# Patient Record
Sex: Female | Born: 1945 | Race: White | Hispanic: No | Marital: Married | Smoking: Never smoker
Health system: Southern US, Community
[De-identification: ages and names within clinical notes are randomized; demographics above are authoritative.]

## PROBLEM LIST (undated history)

## (undated) DIAGNOSIS — N189 Chronic kidney disease, unspecified: Secondary | ICD-10-CM

## (undated) DIAGNOSIS — I509 Heart failure, unspecified: Secondary | ICD-10-CM

## (undated) DIAGNOSIS — H9190 Unspecified hearing loss, unspecified ear: Secondary | ICD-10-CM

## (undated) DIAGNOSIS — R32 Unspecified urinary incontinence: Secondary | ICD-10-CM

## (undated) DIAGNOSIS — I429 Cardiomyopathy, unspecified: Secondary | ICD-10-CM

## (undated) DIAGNOSIS — E119 Type 2 diabetes mellitus without complications: Secondary | ICD-10-CM

## (undated) DIAGNOSIS — I10 Essential (primary) hypertension: Secondary | ICD-10-CM

## (undated) DIAGNOSIS — E7800 Pure hypercholesterolemia, unspecified: Secondary | ICD-10-CM

## (undated) DIAGNOSIS — K219 Gastro-esophageal reflux disease without esophagitis: Secondary | ICD-10-CM

## (undated) DIAGNOSIS — E669 Obesity, unspecified: Secondary | ICD-10-CM

## (undated) DIAGNOSIS — Z972 Presence of dental prosthetic device (complete) (partial): Secondary | ICD-10-CM

## (undated) DIAGNOSIS — G4730 Sleep apnea, unspecified: Secondary | ICD-10-CM

## (undated) DIAGNOSIS — M1990 Unspecified osteoarthritis, unspecified site: Secondary | ICD-10-CM

## (undated) HISTORY — PX: TONSILLECTOMY: SUR1361

## (undated) HISTORY — PX: PACEMAKER INSERTION: SHX728

## (undated) HISTORY — PX: KIDNEY STONE SURGERY: SHX686

## (undated) HISTORY — PX: DENTAL SURGERY: SHX609

---

## 2004-06-02 ENCOUNTER — Encounter: Payer: Self-pay | Admitting: Rheumatology

## 2004-09-02 ENCOUNTER — Emergency Department: Payer: Self-pay | Admitting: Emergency Medicine

## 2005-02-17 ENCOUNTER — Emergency Department: Payer: Self-pay | Admitting: Emergency Medicine

## 2005-05-06 ENCOUNTER — Ambulatory Visit: Payer: Self-pay

## 2005-05-30 ENCOUNTER — Ambulatory Visit: Payer: Self-pay | Admitting: Cardiology

## 2005-06-04 ENCOUNTER — Ambulatory Visit: Payer: Self-pay | Admitting: Internal Medicine

## 2005-07-03 ENCOUNTER — Ambulatory Visit (HOSPITAL_COMMUNITY): Admission: RE | Admit: 2005-07-03 | Discharge: 2005-07-04 | Payer: Self-pay | Admitting: Cardiovascular Disease

## 2005-07-03 ENCOUNTER — Ambulatory Visit: Payer: Self-pay | Admitting: Internal Medicine

## 2005-07-16 ENCOUNTER — Ambulatory Visit: Payer: Self-pay

## 2006-03-01 ENCOUNTER — Ambulatory Visit: Payer: Self-pay

## 2006-06-11 ENCOUNTER — Ambulatory Visit: Payer: Self-pay | Admitting: Internal Medicine

## 2006-11-08 ENCOUNTER — Ambulatory Visit: Payer: Self-pay | Admitting: Family Medicine

## 2006-12-24 ENCOUNTER — Ambulatory Visit: Payer: Self-pay | Admitting: Family Medicine

## 2007-02-24 ENCOUNTER — Inpatient Hospital Stay: Payer: Self-pay | Admitting: Otolaryngology

## 2007-02-24 ENCOUNTER — Other Ambulatory Visit: Payer: Self-pay

## 2007-07-25 ENCOUNTER — Ambulatory Visit: Payer: Self-pay | Admitting: Family Medicine

## 2008-05-12 DIAGNOSIS — I5023 Acute on chronic systolic (congestive) heart failure: Secondary | ICD-10-CM | POA: Insufficient documentation

## 2008-05-12 DIAGNOSIS — Z95810 Presence of automatic (implantable) cardiac defibrillator: Secondary | ICD-10-CM | POA: Insufficient documentation

## 2008-05-12 DIAGNOSIS — I428 Other cardiomyopathies: Secondary | ICD-10-CM | POA: Insufficient documentation

## 2008-05-12 DIAGNOSIS — I447 Left bundle-branch block, unspecified: Secondary | ICD-10-CM | POA: Insufficient documentation

## 2009-05-13 ENCOUNTER — Ambulatory Visit: Payer: Self-pay | Admitting: Cardiology

## 2009-05-20 ENCOUNTER — Ambulatory Visit: Payer: Self-pay | Admitting: Cardiology

## 2011-09-04 ENCOUNTER — Ambulatory Visit: Payer: Self-pay | Admitting: Family Medicine

## 2012-07-02 ENCOUNTER — Ambulatory Visit: Payer: Self-pay | Admitting: Unknown Physician Specialty

## 2014-04-07 DIAGNOSIS — I429 Cardiomyopathy, unspecified: Secondary | ICD-10-CM | POA: Insufficient documentation

## 2014-04-07 DIAGNOSIS — Z95810 Presence of automatic (implantable) cardiac defibrillator: Secondary | ICD-10-CM | POA: Insufficient documentation

## 2014-04-07 DIAGNOSIS — G4730 Sleep apnea, unspecified: Secondary | ICD-10-CM | POA: Insufficient documentation

## 2014-04-07 DIAGNOSIS — K219 Gastro-esophageal reflux disease without esophagitis: Secondary | ICD-10-CM | POA: Insufficient documentation

## 2014-04-08 DIAGNOSIS — I429 Cardiomyopathy, unspecified: Secondary | ICD-10-CM | POA: Diagnosis not present

## 2014-04-08 DIAGNOSIS — I509 Heart failure, unspecified: Secondary | ICD-10-CM | POA: Diagnosis not present

## 2014-04-08 DIAGNOSIS — I5023 Acute on chronic systolic (congestive) heart failure: Secondary | ICD-10-CM | POA: Diagnosis not present

## 2014-04-08 DIAGNOSIS — I447 Left bundle-branch block, unspecified: Secondary | ICD-10-CM | POA: Diagnosis not present

## 2014-04-08 DIAGNOSIS — Z95810 Presence of automatic (implantable) cardiac defibrillator: Secondary | ICD-10-CM | POA: Diagnosis not present

## 2014-07-15 DIAGNOSIS — I10 Essential (primary) hypertension: Secondary | ICD-10-CM | POA: Diagnosis not present

## 2014-07-15 DIAGNOSIS — E1165 Type 2 diabetes mellitus with hyperglycemia: Secondary | ICD-10-CM | POA: Diagnosis not present

## 2014-11-10 DIAGNOSIS — I447 Left bundle-branch block, unspecified: Secondary | ICD-10-CM | POA: Diagnosis not present

## 2014-11-10 DIAGNOSIS — I5023 Acute on chronic systolic (congestive) heart failure: Secondary | ICD-10-CM | POA: Diagnosis not present

## 2014-11-10 DIAGNOSIS — I429 Cardiomyopathy, unspecified: Secondary | ICD-10-CM | POA: Diagnosis not present

## 2014-11-10 DIAGNOSIS — Z95810 Presence of automatic (implantable) cardiac defibrillator: Secondary | ICD-10-CM | POA: Diagnosis not present

## 2014-11-23 DIAGNOSIS — I5023 Acute on chronic systolic (congestive) heart failure: Secondary | ICD-10-CM | POA: Diagnosis not present

## 2014-12-30 DIAGNOSIS — I5023 Acute on chronic systolic (congestive) heart failure: Secondary | ICD-10-CM | POA: Diagnosis not present

## 2015-01-25 DIAGNOSIS — I5023 Acute on chronic systolic (congestive) heart failure: Secondary | ICD-10-CM | POA: Diagnosis not present

## 2015-02-09 DIAGNOSIS — Z95810 Presence of automatic (implantable) cardiac defibrillator: Secondary | ICD-10-CM | POA: Diagnosis not present

## 2015-02-09 DIAGNOSIS — I447 Left bundle-branch block, unspecified: Secondary | ICD-10-CM | POA: Diagnosis not present

## 2015-02-09 DIAGNOSIS — I5023 Acute on chronic systolic (congestive) heart failure: Secondary | ICD-10-CM | POA: Diagnosis not present

## 2015-02-09 DIAGNOSIS — I429 Cardiomyopathy, unspecified: Secondary | ICD-10-CM | POA: Diagnosis not present

## 2015-02-11 ENCOUNTER — Encounter
Admission: RE | Admit: 2015-02-11 | Discharge: 2015-02-11 | Disposition: A | Discharge status: Home / Self Care | Payer: Medicare Other | Source: Ambulatory Visit | Attending: Cardiology | Admitting: Cardiology

## 2015-02-11 ENCOUNTER — Ambulatory Visit
Admission: RE | Admit: 2015-02-11 | Discharge: 2015-02-11 | Disposition: A | Discharge status: Home / Self Care | Payer: Medicare Other | Source: Ambulatory Visit | Attending: Cardiology | Admitting: Cardiology

## 2015-02-11 ENCOUNTER — Other Ambulatory Visit: Payer: Self-pay

## 2015-02-11 DIAGNOSIS — Z950 Presence of cardiac pacemaker: Secondary | ICD-10-CM | POA: Diagnosis not present

## 2015-02-11 DIAGNOSIS — Z01810 Encounter for preprocedural cardiovascular examination: Secondary | ICD-10-CM | POA: Insufficient documentation

## 2015-02-11 DIAGNOSIS — Z01811 Encounter for preprocedural respiratory examination: Secondary | ICD-10-CM | POA: Diagnosis not present

## 2015-02-11 DIAGNOSIS — Z01818 Encounter for other preprocedural examination: Secondary | ICD-10-CM | POA: Diagnosis not present

## 2015-02-11 HISTORY — DX: Gastro-esophageal reflux disease without esophagitis: K21.9

## 2015-02-11 HISTORY — DX: Essential (primary) hypertension: I10

## 2015-02-11 HISTORY — DX: Obesity, unspecified: E66.9

## 2015-02-11 HISTORY — DX: Sleep apnea, unspecified: G47.30

## 2015-02-11 HISTORY — DX: Unspecified urinary incontinence: R32

## 2015-02-11 HISTORY — DX: Unspecified osteoarthritis, unspecified site: M19.90

## 2015-02-11 HISTORY — DX: Heart failure, unspecified: I50.9

## 2015-02-11 HISTORY — DX: Type 2 diabetes mellitus without complications: E11.9

## 2015-02-11 HISTORY — DX: Cardiomyopathy, unspecified: I42.9

## 2015-02-11 HISTORY — DX: Chronic kidney disease, unspecified: N18.9

## 2015-02-11 LAB — BASIC METABOLIC PANEL
ANION GAP: 8 (ref 5–15)
BUN: 11 mg/dL (ref 6–20)
CALCIUM: 9.5 mg/dL (ref 8.9–10.3)
CO2: 29 mmol/L (ref 22–32)
Chloride: 100 mmol/L — ABNORMAL LOW (ref 101–111)
Creatinine, Ser: 0.69 mg/dL (ref 0.44–1.00)
Glucose, Bld: 325 mg/dL — ABNORMAL HIGH (ref 65–99)
Potassium: 3.6 mmol/L (ref 3.5–5.1)
Sodium: 137 mmol/L (ref 135–145)

## 2015-02-11 LAB — DIFFERENTIAL
BASOS ABS: 0 10*3/uL (ref 0–0.1)
BASOS PCT: 0 %
EOS ABS: 0.1 10*3/uL (ref 0–0.7)
Eosinophils Relative: 2 %
Lymphocytes Relative: 28 %
Lymphs Abs: 1.9 10*3/uL (ref 1.0–3.6)
Monocytes Absolute: 0.6 10*3/uL (ref 0.2–0.9)
Monocytes Relative: 8 %
NEUTROS ABS: 4.3 10*3/uL (ref 1.4–6.5)
NEUTROS PCT: 62 %

## 2015-02-11 LAB — CBC
HCT: 37.9 % (ref 35.0–47.0)
HEMOGLOBIN: 12.7 g/dL (ref 12.0–16.0)
MCH: 29.9 pg (ref 26.0–34.0)
MCHC: 33.4 g/dL (ref 32.0–36.0)
MCV: 89.4 fL (ref 80.0–100.0)
Platelets: 169 10*3/uL (ref 150–440)
RBC: 4.25 MIL/uL (ref 3.80–5.20)
RDW: 13.4 % (ref 11.5–14.5)
WBC: 6.9 10*3/uL (ref 3.6–11.0)

## 2015-02-11 LAB — PROTIME-INR
INR: 1
PROTHROMBIN TIME: 13.4 s (ref 11.4–15.0)

## 2015-02-11 LAB — SURGICAL PCR SCREEN
MRSA, PCR: NEGATIVE
Staphylococcus aureus: NEGATIVE

## 2015-02-11 LAB — APTT: APTT: 25 s (ref 24–36)

## 2015-02-11 NOTE — Patient Instructions (Signed)
  Your procedure is scheduled on: 02/15/15 Tues  Report to Day Surgery. To find out your arrival time please call [phone] between 1PM - 3PM on 02/14/15 Mon.  Remember: Instructions that are not followed completely may result in serious medical risk, up to and including death, or upon the discretion of your surgeon and anesthesiologist your surgery may need to be rescheduled.    __x__ 1. Do not eat food or drink liquids after midnight. No gum chewing or hard candies.     ____ 2. No Alcohol for 24 hours before or after surgery.   ____ 3. Bring all medications with you on the day of surgery if instructed.    _x___ 4. Notify your doctor if there is any change in your medical condition     (cold, fever, infections).     Do not wear jewelry, make-up, hairpins, clips or nail polish.  Do not wear lotions, powders, or perfumes. You may wear deodorant.  Do not shave 48 hours prior to surgery. Men may shave face and neck.  Do not bring valuables to the hospital.    [facility] belongings or valuables.               Contacts, dentures or bridgework may not be worn into surgery.  Leave your suitcase in the car. After surgery it may be brought to your room.  For patients admitted to the hospital, discharge time is determined by your                treatment team.   Patients discharged the day of surgery will not be allowed to drive home.   Please read over the following fact sheets that you were given:   MRSA booklet   __x Take these medicines the morning of surgery with A SIP OF WATER:    1.carvedilol (COREG) 25 MG tablet  2. lisinopril (PRINIVIL,ZESTRIL) 10 MG tablet  3. omeprazole (PRILOSEC) 20 MG capsule  4.  5.  6.  ____ Fleet Enema (as directed)   _x___ Use CHG Soap as directed  ____ Use inhalers on the day of surgery  ____ Stop metformin 2 days prior to surgery    ____ Take 1/2 of usual insulin dose the night before surgery and none on the  morning of surgery.   _x___ Stop Coumadin/Plavix/aspirin on Stopped Aspirin 1 week ago  ____ Stop Anti-inflammatories on    ____ Stop supplements until after surgery.    ____ Bring C-Pap to the hospital.

## 2015-02-15 ENCOUNTER — Encounter: Payer: Self-pay | Admitting: *Deleted

## 2015-02-15 ENCOUNTER — Encounter
Admission: RE | Disposition: A | Discharge status: Home / Self Care | Payer: Self-pay | Source: Ambulatory Visit | Attending: Cardiology

## 2015-02-15 ENCOUNTER — Ambulatory Visit: Payer: Medicare Other | Admitting: Certified Registered Nurse Anesthetist

## 2015-02-15 ENCOUNTER — Ambulatory Visit
Admission: RE | Admit: 2015-02-15 | Discharge: 2015-02-15 | Disposition: A | Discharge status: Home / Self Care | Payer: Medicare Other | Source: Ambulatory Visit | Attending: Cardiology | Admitting: Cardiology

## 2015-02-15 DIAGNOSIS — Z809 Family history of malignant neoplasm, unspecified: Secondary | ICD-10-CM | POA: Diagnosis not present

## 2015-02-15 DIAGNOSIS — Z79899 Other long term (current) drug therapy: Secondary | ICD-10-CM | POA: Insufficient documentation

## 2015-02-15 DIAGNOSIS — I129 Hypertensive chronic kidney disease with stage 1 through stage 4 chronic kidney disease, or unspecified chronic kidney disease: Secondary | ICD-10-CM | POA: Diagnosis not present

## 2015-02-15 DIAGNOSIS — Z95810 Presence of automatic (implantable) cardiac defibrillator: Secondary | ICD-10-CM | POA: Diagnosis not present

## 2015-02-15 DIAGNOSIS — M1990 Unspecified osteoarthritis, unspecified site: Secondary | ICD-10-CM | POA: Insufficient documentation

## 2015-02-15 DIAGNOSIS — I509 Heart failure, unspecified: Secondary | ICD-10-CM | POA: Diagnosis not present

## 2015-02-15 DIAGNOSIS — N189 Chronic kidney disease, unspecified: Secondary | ICD-10-CM | POA: Diagnosis not present

## 2015-02-15 DIAGNOSIS — Z7982 Long term (current) use of aspirin: Secondary | ICD-10-CM | POA: Insufficient documentation

## 2015-02-15 DIAGNOSIS — Z6838 Body mass index (BMI) 38.0-38.9, adult: Secondary | ICD-10-CM | POA: Diagnosis not present

## 2015-02-15 DIAGNOSIS — Z823 Family history of stroke: Secondary | ICD-10-CM | POA: Diagnosis not present

## 2015-02-15 DIAGNOSIS — I420 Dilated cardiomyopathy: Secondary | ICD-10-CM | POA: Insufficient documentation

## 2015-02-15 DIAGNOSIS — I447 Left bundle-branch block, unspecified: Secondary | ICD-10-CM | POA: Insufficient documentation

## 2015-02-15 DIAGNOSIS — K219 Gastro-esophageal reflux disease without esophagitis: Secondary | ICD-10-CM | POA: Diagnosis not present

## 2015-02-15 DIAGNOSIS — G4730 Sleep apnea, unspecified: Secondary | ICD-10-CM | POA: Diagnosis not present

## 2015-02-15 DIAGNOSIS — Z45018 Encounter for adjustment and management of other part of cardiac pacemaker: Secondary | ICD-10-CM | POA: Diagnosis not present

## 2015-02-15 DIAGNOSIS — I429 Cardiomyopathy, unspecified: Secondary | ICD-10-CM | POA: Diagnosis not present

## 2015-02-15 DIAGNOSIS — I5023 Acute on chronic systolic (congestive) heart failure: Secondary | ICD-10-CM | POA: Insufficient documentation

## 2015-02-15 DIAGNOSIS — I5022 Chronic systolic (congestive) heart failure: Secondary | ICD-10-CM | POA: Diagnosis not present

## 2015-02-15 DIAGNOSIS — R0602 Shortness of breath: Secondary | ICD-10-CM | POA: Insufficient documentation

## 2015-02-15 HISTORY — PX: IMPLANTABLE CARDIOVERTER DEFIBRILLATOR (ICD) GENERATOR CHANGE: SHX5469

## 2015-02-15 LAB — GLUCOSE, CAPILLARY: GLUCOSE-CAPILLARY: 207 mg/dL — AB (ref 65–99)

## 2015-02-15 SURGERY — ICD GENERATOR CHANGE
Anesthesia: General | Site: Chest | Wound class: Clean

## 2015-02-15 MED ORDER — CEPHALEXIN 250 MG PO CAPS: 250.0000 mg | ORAL_CAPSULE | Freq: Four times a day (QID) | ORAL | Status: DC

## 2015-02-15 MED ORDER — LIDOCAINE 1 % OPTIME INJ - NO CHARGE
INTRAMUSCULAR | Status: DC | PRN
  Administered 2015-02-15: 28 mL

## 2015-02-15 MED ORDER — GENTAMICIN SULFATE 40 MG/ML IJ SOLN
INTRAMUSCULAR | Status: DC | PRN
  Administered 2015-02-15: 80 mg

## 2015-02-15 MED ORDER — FENTANYL CITRATE (PF) 100 MCG/2ML IJ SOLN: 25.0000 ug | INTRAMUSCULAR | Status: DC | PRN

## 2015-02-15 MED ORDER — SODIUM CHLORIDE 0.9 % IV SOLN
INTRAVENOUS | Status: DC
  Administered 2015-02-15: 11:00:00 via INTRAVENOUS

## 2015-02-15 MED ORDER — PROPOFOL 500 MG/50ML IV EMUL
INTRAVENOUS | Status: DC | PRN
  Administered 2015-02-15: 25 ug/kg/min via INTRAVENOUS

## 2015-02-15 MED ORDER — ONDANSETRON HCL 4 MG/2ML IJ SOLN: 4.0000 mg | Freq: Four times a day (QID) | INTRAMUSCULAR | Status: DC | PRN

## 2015-02-15 MED ORDER — CEFAZOLIN SODIUM 1-5 GM-% IV SOLN
INTRAVENOUS | Status: AC
  Administered 2015-02-15: 1 g via INTRAVENOUS
  Filled 2015-02-15: qty 50

## 2015-02-15 MED ORDER — SODIUM CHLORIDE 0.9 % IR SOLN: Freq: Once | Status: DC

## 2015-02-15 MED ORDER — ACETAMINOPHEN 325 MG PO TABS: 325.0000 mg | ORAL_TABLET | ORAL | Status: DC | PRN

## 2015-02-15 MED ORDER — MIDAZOLAM HCL 2 MG/2ML IJ SOLN
INTRAMUSCULAR | Status: DC | PRN
  Administered 2015-02-15 (×2): 1 mg via INTRAVENOUS

## 2015-02-15 MED ORDER — CEFAZOLIN SODIUM 1-5 GM-% IV SOLN
1.0000 g | Freq: Once | INTRAVENOUS | Status: AC
  Administered 2015-02-15: 1 g via INTRAVENOUS

## 2015-02-15 MED ORDER — ONDANSETRON HCL 4 MG/2ML IJ SOLN: 4.0000 mg | Freq: Once | INTRAMUSCULAR | Status: DC | PRN

## 2015-02-15 MED ORDER — PROPOFOL 10 MG/ML IV BOLUS
INTRAVENOUS | Status: DC | PRN
  Administered 2015-02-15: 10 mg via INTRAVENOUS

## 2015-02-15 MED ORDER — FENTANYL CITRATE (PF) 100 MCG/2ML IJ SOLN
INTRAMUSCULAR | Status: DC | PRN
  Administered 2015-02-15: 50 ug via INTRAVENOUS

## 2015-02-15 MED ORDER — GENTAMICIN SULFATE 40 MG/ML IJ SOLN
INTRAMUSCULAR | Status: AC
  Filled 2015-02-15: qty 2

## 2015-02-15 SURGICAL SUPPLY — 46 items
BAG DECANTER FOR FLEXI CONT (MISCELLANEOUS) ×3 IMPLANT
BLADE SURG SZ10 CARB STEEL (BLADE) ×3 IMPLANT
CANISTER SUCT 1200ML W/VALVE (MISCELLANEOUS) ×3 IMPLANT
CHLORAPREP W/TINT 26ML (MISCELLANEOUS) ×3 IMPLANT
CLOSURE WOUND 1/2 X4 (GAUZE/BANDAGES/DRESSINGS) ×1
COVER LIGHT HANDLE STERIS (MISCELLANEOUS) ×6 IMPLANT
DEVICE DISSECT PLASMABLAD 3.0S (MISCELLANEOUS) ×1 IMPLANT
DRAPE C-ARM XRAY 36X54 (DRAPES) ×3 IMPLANT
DRAPE INCISE IOBAN 66X45 STRL (DRAPES) ×3 IMPLANT
DRAPE PED LAPAROTOMY (DRAPES) ×3 IMPLANT
DRSG TEGADERM 4X4.75 (GAUZE/BANDAGES/DRESSINGS) ×3 IMPLANT
DRSG TEGADERM 6X8 (GAUZE/BANDAGES/DRESSINGS) ×3 IMPLANT
GLOVE BIO SURGEON STRL SZ8 (GLOVE) ×3 IMPLANT
GOWN STRL REUS W/ TWL LRG LVL3 (GOWN DISPOSABLE) ×1 IMPLANT
GOWN STRL REUS W/ TWL XL LVL3 (GOWN DISPOSABLE) ×1 IMPLANT
GOWN STRL REUS W/TWL LRG LVL3 (GOWN DISPOSABLE) ×3
GOWN STRL REUS W/TWL XL LVL3 (GOWN DISPOSABLE) ×3
GRADUATE 1200CC STRL 31836 (MISCELLANEOUS) ×3 IMPLANT
ICD VIVA XT CRT-D DTBA1D1 (ICD Generator) ×2 IMPLANT
IV NS 1000ML (IV SOLUTION) ×3
IV NS 1000ML BAXH (IV SOLUTION) ×1 IMPLANT
KIT RM TURNOVER STRD PROC AR (KITS) ×3 IMPLANT
NDL FILTER BLUNT 18X1 1/2 (NEEDLE) ×1 IMPLANT
NDL HYPO 25X1 1.5 SAFETY (NEEDLE) ×1 IMPLANT
NDL SPNL 22GX3.5 QUINCKE BK (NEEDLE) ×1 IMPLANT
NEEDLE FILTER BLUNT 18X 1/2SAF (NEEDLE) ×2
NEEDLE FILTER BLUNT 18X1 1/2 (NEEDLE) ×1 IMPLANT
NEEDLE HYPO 25X1 1.5 SAFETY (NEEDLE) ×3 IMPLANT
NEEDLE SPNL 22GX3.5 QUINCKE BK (NEEDLE) ×3 IMPLANT
NS IRRIG 500ML POUR BTL (IV SOLUTION) ×3 IMPLANT
PACK BASIN MINOR ARMC (MISCELLANEOUS) ×3 IMPLANT
PACK PACE INSERTION (MISCELLANEOUS) ×3 IMPLANT
PAD GROUND ADULT SPLIT (MISCELLANEOUS) ×3 IMPLANT
PAD STATPAD (MISCELLANEOUS) ×3 IMPLANT
PLASMABLADE 3.0S (MISCELLANEOUS) ×3
STRAP SAFETY BODY (MISCELLANEOUS) ×3 IMPLANT
STRIP CLOSURE SKIN 1/2X4 (GAUZE/BANDAGES/DRESSINGS) ×2 IMPLANT
SUT SILK 2 0 SH (SUTURE) ×3 IMPLANT
SUT VIC AB 2-0 CT1 27 (SUTURE) ×3
SUT VIC AB 2-0 CT1 TAPERPNT 27 (SUTURE) ×1 IMPLANT
SUT VIC AB 2-0 CT2 27 (SUTURE) ×3 IMPLANT
SUT VIC AB 3-0 PS2 18 (SUTURE) ×3 IMPLANT
SUT VIC AB 4-0 PS2 18 (SUTURE) ×3 IMPLANT
SYR BULB IRRIG 60ML STRL (SYRINGE) ×3 IMPLANT
SYR CONTROL 10ML (SYRINGE) ×3 IMPLANT
SYRINGE 10CC LL (SYRINGE) ×3 IMPLANT

## 2015-02-15 NOTE — Anesthesia Procedure Notes (Signed)
Performed by: ,  Pre-anesthesia Checklist: Patient identified, Emergency Drugs available, Suction available, Patient being monitored and Timeout performed Patient Re-evaluated:Patient Re-evaluated prior to inductionOxygen Delivery Method: Simple face mask Intubation Type: IV induction       

## 2015-02-15 NOTE — Anesthesia Postprocedure Evaluation (Signed)
  Anesthesia Post-op Note  Patient: Emma Guzman  Procedure(s) Performed: Procedure(s): ICD GENERATOR CHANGE (N/A)  Anesthesia type:General  Patient location: PACU  Post pain: Pain level controlled  Post assessment: Post-op Vital signs reviewed, Patient's Cardiovascular Status Stable, Respiratory Function Stable, Patent Airway and No signs of Nausea or vomiting  Post vital signs: Reviewed and stable  Last Vitals:  Filed Vitals:   02/15/15 1340  BP:   Pulse:   Temp: 36.1 C  Resp:     Level of consciousness: awake, alert  and patient cooperative  Complications: No apparent anesthesia complications

## 2015-02-15 NOTE — Interval H&P Note (Signed)
History and Physical Interval Note:  02/15/2015 7:42 AM  Emma Guzman  has presented today for surgery, with the diagnosis of CHF  The various methods of treatment have been discussed with the patient and family. After consideration of risks, benefits and other options for treatment, the patient has consented to  Procedure(s): ICD GENERATOR CHANGE (N/A) as a surgical intervention .  The patient's history has been reviewed, patient examined, no change in status, stable for surgery.  I have reviewed the patient's chart and labs.  Questions were answered to the patient's satisfaction.     ,

## 2015-02-15 NOTE — H&P (Signed)
Printout Information Document Contents Office Visit Document Received Date 02/11/2015 Document Source Organization [facility] Patient Demographics  Reason for Visit  Encounter Details  Last Filed Vital Signs (In this Visit)  Instructions  Progress Notes  Plan of Care  Visit Diagnoses  Document Information Encounter Summary - Emma Guzman (69 y.o. Female)As of Nov. 11, 2016 Patient Demographics Patient Address Communication Language Race / Ethnicity  [address] [place], [place] [address]  [phone] [facility]) [phone] [facility]) [phone] (Work)  English (Preferred) Caucasian/White / Not Hispanic/Latino  Reason for Visit Reason Comments  Follow-up PM alarm going off.  Encounter Details Date Type Department Care Team Description  02/09/2015 Office Visit [facility]  [address]  [place], [place] [address]  [phone]  Marcina Millard, MD  [address]  [facility]  [place], [place] [address]  [phone]  [phone] (Fax)  Acute on chronic systolic heart failure (Primary Dx);Cardiomyopathy;Left bundle-branch block;Automatic implantable cardioverter-defibrillator in situ;Biventricular ICD (implantable cardioverter-defibrillator) in place  Last Filed Vital Signs (In this Visit) Vital Sign Reading Time Taken  Blood Pressure 138/74 02/09/2015 1:30 PM EST  Pulse 60 02/09/2015 1:30 PM EST  Temperature - -  Respiratory Rate - -  Oxygen Saturation - -  Weight 95.5 kg (210 lb 9.6 oz) 02/09/2015 1:30 PM EST  Height 157.5 cm (5\' 2" ) 02/09/2015 1:30 PM EST  Body Mass Index 38.52 02/09/2015 1:30 PM EST  Instructions Patient Instructions - Marcina Millard, MD - 02/09/2015 1:30 PM EST DASH Eating Plan DASH stands for "Dietary Approaches to Stop Hypertension." The DASH eating plan is a healthy eating plan that has been shown to reduce high blood pressure (hypertension). Additional health  benefits may include reducing the risk of type 2 diabetes mellitus, heart disease, and stroke. The DASH eating plan may also help with weight loss. WHAT DO I NEED TO KNOW ABOUT THE DASH EATING PLAN? For the DASH eating plan, you will follow these general guidelines:  Choose foods with a percent daily value for sodium of less than 5% (as listed on the food label).  Use salt-free seasonings or herbs instead of table salt or sea salt.  Check with your health care provider or pharmacist before using salt substitutes.  Eat lower-sodium products, often labeled as "lower sodium" or "no salt added."  Eat fresh foods.  Eat more vegetables, fruits, and low-fat dairy products.  Choose whole grains. Look for the word "whole" as the first word in the ingredient list.  Choose fish and skinless chicken or [place] more often than red meat. Limit fish, poultry, and meat to 6 oz (170 g) each day.  Limit sweets, desserts, sugars, and sugary drinks.  Choose heart-healthy fats.  Limit cheese to 1 oz (28 g) per day.  Eat more home-cooked food and less restaurant, buffet, and fast food.  Limit fried foods.  Cook foods using methods other than frying.  Limit canned vegetables. If you do use them, rinse them well to decrease the sodium.  When eating at a restaurant, ask that your food be prepared with less salt, or no salt if possible. WHAT FOODS CAN I EAT? Seek help from a dietitian for individual calorie needs. Grains Whole grain or whole wheat bread. Brown rice. Whole grain or whole wheat pasta. Quinoa, bulgur, and whole grain cereals. Low-sodium cereals. Corn or whole wheat flour tortillas. Whole grain cornbread. Whole grain crackers. Low-sodium crackers. Vegetables Fresh or frozen vegetables (raw, steamed, roasted, or grilled). Low-sodium or reduced-sodium tomato  and vegetable juices. Low-sodium or reduced-sodium tomato sauce and paste. Low-sodium or reduced-sodium canned vegetables.  Fruits All  fresh, canned (in natural juice), or frozen fruits. Meat and Other Protein Products Ground beef (85% or leaner), grass-fed beef, or beef trimmed of fat. Skinless chicken or [place]. Ground chicken or [place]. Pork trimmed of fat. All fish and seafood. Eggs. Dried beans, peas, or lentils. Unsalted nuts and seeds. Unsalted canned beans. Dairy Low-fat dairy products, such as skim or 1% milk, 2% or reduced-fat cheeses, low-fat ricotta or cottage cheese, or plain low-fat yogurt. Low-sodium or reduced-sodium cheeses. Fats and Oils Tub margarines without trans fats. Light or reduced-fat mayonnaise and salad dressings (reduced sodium). Avocado. Safflower, olive, or canola oils. Natural peanut or almond butter. Other Unsalted popcorn and pretzels. The items listed above may not be a complete list of recommended foods or beverages. Contact your dietitian for more options. WHAT FOODS ARE NOT RECOMMENDED? Grains White bread. White pasta. White rice. Refined cornbread. Bagels and croissants. Crackers that contain trans fat. Vegetables Creamed or fried vegetables. Vegetables in a cheese sauce. Regular canned vegetables. Regular canned tomato sauce and paste. Regular tomato and vegetable juices. Fruits Dried fruits. Canned fruit in light or heavy syrup. Fruit juice. Meat and Other Protein Products Fatty cuts of meat. Ribs, chicken wings, bacon, sausage, bologna, salami, chitterlings, fatback, hot dogs, bratwurst, and packaged luncheon meats. Salted nuts and seeds. Canned beans with salt. Dairy Whole or 2% milk, cream, half-and-half, and cream cheese. Whole-fat or sweetened yogurt. Full-fat cheeses or blue cheese. Nondairy creamers and whipped toppings. Processed cheese, cheese spreads, or cheese curds. Condiments Onion and garlic salt, seasoned salt, table salt, and sea salt. Canned and packaged gravies. Worcestershire sauce. Tartar sauce. Barbecue sauce. Teriyaki sauce. Soy sauce, including reduced sodium.  Steak sauce. Fish sauce. Oyster sauce. Cocktail sauce. Horseradish. Ketchup and mustard. Meat flavorings and tenderizers. Bouillon cubes. Hot sauce. Tabasco sauce. Marinades. Taco seasonings. Relishes. Fats and Oils Butter, stick margarine, lard, shortening, ghee, and bacon fat. Coconut, palm kernel, or palm oils. Regular salad dressings. Other Pickles and olives. Salted popcorn and pretzels. The items listed above may not be a complete list of foods and beverages to avoid. Contact your dietitian for more information. WHERE CAN I FIND MORE INFORMATION? National Heart, Lung, and Blood Institute: CablePromo.it  This information is not intended to replace advice given to you by your health care provider. Make sure you discuss any questions you have with your health care provider.  Document Released: 03/08/2011 Document Revised: 04/09/2014 Document Reviewed: 01/21/2013 Elsevier Interactive Patient Education 2016 Elsevier Inc.   Progress Notes Marcina Millard, MD - 02/09/2015 1:30 PM EST Formatting of this note may be different from the original. Established Patient Visit   Chief Complaint: Chief Complaint  Patient presents with  . Follow-up  PM alarm going off.  Date of Service: 02/09/2015 Date of Birth: 1945-09-18 PCP: Farrel Demark, MD  History of Present Illness: Ms. Levitan is a 69 y.o.female patient who returns for  1. Nonischemic dilated cardiomyopathy 2. Chronic systolic congestive heart failure 3. 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 CPAP 5. Morbid obesity  Patient denies chest pain. She does have chronic exertional dyspnea. She does report her shortness of breath improves during the cool weather. She denies palpitations or heart racing. She denies peripheral edema. Echocardiogram 10/30/2013 revealed normal left ventricular function with LV ejection fraction of 45% with moderate aortic insufficiency. ICD interrogation on 01/25/2015  revealed that the Bi V ICD is  a elective replacement indication.  Past Medical and Surgical History  Past Medical History Past Medical History  Diagnosis Date  . GERD (gastroesophageal reflux disease)  . Obesity  . Sleep apnea   Past Surgical History She has a past surgical history that includes Insert / replace / remove pacemaker.   Medications and Allergies  Current Medications  Current Outpatient Prescriptions  Medication Sig Dispense Refill  . acetaminophen (TYLENOL) 500 MG tablet Take 500 mg by mouth 2 (two) times daily.  Marland Kitchen aspirin 81 MG EC tablet Take 81 mg by mouth once daily.  . carvedilol (COREG) 25 MG tablet Take 1 tablet (25 mg total) by mouth 2 (two) times daily with meals. 180 tablet 4  . cyclobenzaprine (FLEXERIL) 5 MG tablet Take 5 mg by mouth 2 (two) times daily as needed for Muscle spasms.  Marland Kitchen ezetimibe (ZETIA) 10 mg tablet Take 1 tablet (10 mg total) by mouth once daily. 90 tablet 4  . lisinopril (PRINIVIL,ZESTRIL) 10 MG tablet Take 1 tablet (10 mg total) by mouth once daily. 90 tablet 4  . MV,IRON,MIN/FOLIC ACID/BIOTIN (HAIR, SKIN & NAILS- ARGAN OIL ORAL) Take by mouth.  Marland Kitchen omeprazole (PRILOSEC) 20 MG DR capsule Take 1 capsule (20 mg total) by mouth once daily. 90 capsule 4   No current facility-administered medications for this visit.   Allergies: Review of patient's allergies indicates no known allergies.  Social and Family History  Social History reports that she has never smoked. She does not have any smokeless tobacco history on file. She reports that she does not drink alcohol or use illicit drugs.  Family History Family History  Problem Relation Age of Onset  . Cancer Mother  . Stroke Father   Review of Systems   Review of Systems: The patient denies chest pain, reports chronic exertional shortness of breath, without orthopnea, paroxysmal nocturnal dyspnea, pedal edema, palpitations, heart racing, presyncope, syncope. Review of 12 Systems is negative  except as described above.  Physical Examination   Vitals: Visit Vitals  . BP 138/74 (BP Location: Left upper arm, Patient Position: Sitting, BP Cuff Size: Adult)  . Pulse 60  . Ht 157.5 cm (5\' 2" )  . Wt 95.5 kg (210 lb 9.6 oz)  . BMI 38.52 kg/m2   Ht:157.5 cm (5\' 2" ) Wt:95.5 kg (210 lb 9.6 oz) WUJ:WJXB surface area is 2.04 meters squared. Body mass index is 38.52 kg/(m^2).  HEENT: Pupils equally reactive to light and accomodation  Neck: Supple without thyromegaly, carotid pulses 2+ Lungs: clear to auscultation bilaterally; no wheezes, rales, rhonchi Heart: Regular rate and rhythm. No gallops, murmurs or rub Abdomen: soft nontender, nondistended, with normal bowel sounds Extremities: no cyanosis, clubbing, or edema Peripheral Pulses: 2+ in all extremities, 2+ femoral pulses bilaterally  Assessment   69 y.o. female with  1. Acute on chronic systolic heart failure  2. Cardiomyopathy  3. Left bundle-branch block  4. Automatic implantable cardioverter-defibrillator in situ  5. Biventricular ICD (implantable cardioverter-defibrillator) in place   69 year old female with nonischemic dilated cardiomyopathy, chronic left bundle branch block, chronic systolic congestive heart failure, status post Bi V ICD, with recent interrogation revealing elective replacement indication.  Plan   1. Continue current medications 2. Counseled patient about low-sodium diet 3. DASH diet printed instructions given to the patient 4. Proceed with Bi V ICD change out 5. Return to clinic after Bi V ICD change out  No orders of the defined types were placed in this encounter.  Return in about 2 weeks (  around 02/23/2015), or after ICD changeout.  Marcina Millard, MD    Plan of Care Upcoming Encounters Upcoming Encounters  Date Type Specialty Providers Description  03/01/2015 Appointment Cardiology , Lyn Hollingshead, MD  [address]  [facility]    [place], [place] [address]  [phone]  [phone] (Fax)    03/01/2015 Appointment Cardiology Darrold Junker, Lyn Hollingshead, MD  [address]  [facility]  [place], [place] [address]  [phone]  [phone] (Fax)    Visit Diagnoses   Acute on chronic systolic heart failure - Primary Acute on chronic systolic heart failure   Cardiomyopathy   Left bundle-branch block   Automatic implantable cardioverter-defibrillator in situ   Biventricular ICD (implantable cardioverter-defibrillator) in place Document Information Primary Care Provider Farrel Demark (Jul. 31, 2015 - Present) [phone] (Work) [phone] (Fax)  [address] [place], [place] [address] Document Coverage Dates Nov. 09, 2016 - Nov. 09, 2016 Custodian Organization [facility] [phone] (Work) [place], [place] [address] Encounter Providers Marcina Millard (Attending) [phone] (Work) [phone] (Fax)  [address] [facility] [place], [place] [address] Encounter Date Nov. 09, 2016 - Nov. 09, 2016 Printout Information Document Contents Office Visit Document Received Date 02/11/2015 Document Source Organization [facility] Patient Demographics  Reason for Visit  Encounter Details  Last Filed Vital Signs (In this Visit)  Instructions  Progress Notes  Plan of Care  Visit Diagnoses  Document Information Encounter Summary - Emma Guzman (69 y.o. Female)As of Nov. 11, 2016 Patient Demographics Patient Address Communication Language Race / Ethnicity  [address] [place], [place] [address]  [phone] [facility]) [phone] [facility]) [phone] (Work)  English (Preferred) Caucasian/White / Not Hispanic/Latino  Reason for Visit Reason Comments  Follow-up PM alarm going off.  Encounter Details Date Type Department Care Team Description  02/09/2015 Office Visit  [facility]  [address]  [place], [place] [address]  [phone]  Marcina Millard, MD  [address]  [facility]  [place], [place] [address]  [phone]  [phone] (Fax)  Acute on chronic systolic heart failure (Primary Dx);Cardiomyopathy;Left bundle-branch block;Automatic implantable cardioverter-defibrillator in situ;Biventricular ICD (implantable cardioverter-defibrillator) in place  Last Filed Vital Signs (In this Visit) Vital Sign Reading Time Taken  Blood Pressure 138/74 02/09/2015 1:30 PM EST  Pulse 60 02/09/2015 1:30 PM EST  Temperature - -  Respiratory Rate - -  Oxygen Saturation - -  Weight 95.5 kg (210 lb 9.6 oz) 02/09/2015 1:30 PM EST  Height 157.5 cm (5\' 2" ) 02/09/2015 1:30 PM EST  Body Mass Index 38.52 02/09/2015 1:30 PM EST  Instructions Patient Instructions - Marcina Millard, MD - 02/09/2015 1:30 PM EST DASH Eating Plan DASH stands for "Dietary Approaches to Stop Hypertension." The DASH eating plan is a healthy eating plan that has been shown to reduce high blood pressure (hypertension). Additional health benefits may include reducing the risk of type 2 diabetes mellitus, heart disease, and stroke. The DASH eating plan may also help with weight loss. WHAT DO I NEED TO KNOW ABOUT THE DASH EATING PLAN? For the DASH eating plan, you will follow these general guidelines:  Choose foods with a percent daily value for sodium of less than 5% (as listed on the food label).  Use salt-free seasonings or herbs instead of table salt or sea salt.  Check with your health care provider or pharmacist before using salt substitutes.  Eat lower-sodium products, often labeled as "lower sodium" or "no salt  added."  Eat fresh foods.  Eat more vegetables, fruits, and low-fat dairy products.  Choose whole grains. Look for the word "whole" as the first word in the ingredient list.  Choose fish and skinless chicken or  [place] more often than red meat. Limit fish, poultry, and meat to 6 oz (170 g) each day.  Limit sweets, desserts, sugars, and sugary drinks.  Choose heart-healthy fats.  Limit cheese to 1 oz (28 g) per day.  Eat more home-cooked food and less restaurant, buffet, and fast food.  Limit fried foods.  Cook foods using methods other than frying.  Limit canned vegetables. If you do use them, rinse them well to decrease the sodium.  When eating at a restaurant, ask that your food be prepared with less salt, or no salt if possible. WHAT FOODS CAN I EAT? Seek help from a dietitian for individual calorie needs. Grains Whole grain or whole wheat bread. Brown rice. Whole grain or whole wheat pasta. Quinoa, bulgur, and whole grain cereals. Low-sodium cereals. Corn or whole wheat flour tortillas. Whole grain cornbread. Whole grain crackers. Low-sodium crackers. Vegetables Fresh or frozen vegetables (raw, steamed, roasted, or grilled). Low-sodium or reduced-sodium tomato and vegetable juices. Low-sodium or reduced-sodium tomato sauce and paste. Low-sodium or reduced-sodium canned vegetables.  Fruits All fresh, canned (in natural juice), or frozen fruits. Meat and Other Protein Products Ground beef (85% or leaner), grass-fed beef, or beef trimmed of fat. Skinless chicken or [place]. Ground chicken or [place]. Pork trimmed of fat. All fish and seafood. Eggs. Dried beans, peas, or lentils. Unsalted nuts and seeds. Unsalted canned beans. Dairy Low-fat dairy products, such as skim or 1% milk, 2% or reduced-fat cheeses, low-fat ricotta or cottage cheese, or plain low-fat yogurt. Low-sodium or reduced-sodium cheeses. Fats and Oils Tub margarines without trans fats. Light or reduced-fat mayonnaise and salad dressings (reduced sodium). Avocado. Safflower, olive, or canola oils. Natural peanut or almond butter. Other Unsalted popcorn and pretzels. The items listed above may not be a complete list of  recommended foods or beverages. Contact your dietitian for more options. WHAT FOODS ARE NOT RECOMMENDED? Grains White bread. White pasta. White rice. Refined cornbread. Bagels and croissants. Crackers that contain trans fat. Vegetables Creamed or fried vegetables. Vegetables in a cheese sauce. Regular canned vegetables. Regular canned tomato sauce and paste. Regular tomato and vegetable juices. Fruits Dried fruits. Canned fruit in light or heavy syrup. Fruit juice. Meat and Other Protein Products Fatty cuts of meat. Ribs, chicken wings, bacon, sausage, bologna, salami, chitterlings, fatback, hot dogs, bratwurst, and packaged luncheon meats. Salted nuts and seeds. Canned beans with salt. Dairy Whole or 2% milk, cream, half-and-half, and cream cheese. Whole-fat or sweetened yogurt. Full-fat cheeses or blue cheese. Nondairy creamers and whipped toppings. Processed cheese, cheese spreads, or cheese curds. Condiments Onion and garlic salt, seasoned salt, table salt, and sea salt. Canned and packaged gravies. Worcestershire sauce. Tartar sauce. Barbecue sauce. Teriyaki sauce. Soy sauce, including reduced sodium. Steak sauce. Fish sauce. Oyster sauce. Cocktail sauce. Horseradish. Ketchup and mustard. Meat flavorings and tenderizers. Bouillon cubes. Hot sauce. Tabasco sauce. Marinades. Taco seasonings. Relishes. Fats and Oils Butter, stick margarine, lard, shortening, ghee, and bacon fat. Coconut, palm kernel, or palm oils. Regular salad dressings. Other Pickles and olives. Salted popcorn and pretzels. The items listed above may not be a complete list of foods and beverages to avoid. Contact your dietitian for more information. WHERE CAN I FIND MORE INFORMATION? National Heart, Lung, and Blood Institute: [employer]Prom[employer].it  This information is not intended to replace advice given to you by your health care provider. Make sure you discuss any questions you have with your  health care provider.  Document Released: 03/08/2011 Document Revised: 04/09/2014 Document Reviewed: 01/21/2013 Elsevier Interactive Patient Education 2016 Elsevier Inc.   Progress Notes Marcina Millard, MD - 02/09/2015 1:30 PM EST Formatting of this note may be different from the original. Established Patient Visit   Chief Complaint: Chief Complaint  Patient presents with  . Follow-up  PM alarm going off.  Date of Service: 02/09/2015 Date of Birth: Jun 23, 1945 PCP: Farrel Demark, MD  History of Present Illness: Ms. Einspahr is a 69 y.o.female patient who returns for  1. Nonischemic dilated cardiomyopathy 2. Chronic systolic congestive heart failure 3. 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 CPAP 5. Morbid obesity  Patient denies chest pain. She does have chronic exertional dyspnea. She does report her shortness of breath improves during the cool weather. She denies palpitations or heart racing. She denies peripheral edema. Echocardiogram 10/30/2013 revealed normal left ventricular function with LV ejection fraction of 45% with moderate aortic insufficiency. ICD interrogation on 01/25/2015 revealed that the Bi V ICD is a elective replacement indication.  Past Medical and Surgical History  Past Medical History Past Medical History  Diagnosis Date  . GERD (gastroesophageal reflux disease)  . Obesity  . Sleep apnea   Past Surgical History She has a past surgical history that includes Insert / replace / remove pacemaker.   Medications and Allergies  Current Medications  Current Outpatient Prescriptions  Medication Sig Dispense Refill  . acetaminophen (TYLENOL) 500 MG tablet Take 500 mg by mouth 2 (two) times daily.  Marland Kitchen aspirin 81 MG EC tablet Take 81 mg by mouth once daily.  . carvedilol (COREG) 25 MG tablet Take 1 tablet (25 mg total) by mouth 2 (two) times daily with meals. 180 tablet 4  . cyclobenzaprine (FLEXERIL) 5 MG tablet Take 5 mg by mouth 2 (two) times daily as  needed for Muscle spasms.  Marland Kitchen ezetimibe (ZETIA) 10 mg tablet Take 1 tablet (10 mg total) by mouth once daily. 90 tablet 4  . lisinopril (PRINIVIL,ZESTRIL) 10 MG tablet Take 1 tablet (10 mg total) by mouth once daily. 90 tablet 4  . MV,IRON,MIN/FOLIC ACID/BIOTIN (HAIR, SKIN & NAILS- ARGAN OIL ORAL) Take by mouth.  Marland Kitchen omeprazole (PRILOSEC) 20 MG DR capsule Take 1 capsule (20 mg total) by mouth once daily. 90 capsule 4   No current facility-administered medications for this visit.   Allergies: Review of patient's allergies indicates no known allergies.  Social and Family History  Social History reports that she has never smoked. She does not have any smokeless tobacco history on file. She reports that she does not drink alcohol or use illicit drugs.  Family History Family History  Problem Relation Age of Onset  . Cancer Mother  . Stroke Father   Review of Systems   Review of Systems: The patient denies chest pain, reports chronic exertional shortness of breath, without orthopnea, paroxysmal nocturnal dyspnea, pedal edema, palpitations, heart racing, presyncope, syncope. Review of 12 Systems is negative except as described above.  Physical Examination   Vitals: Visit Vitals  . BP 138/74 (BP Location: Left upper arm, Patient Position: Sitting, BP Cuff Size: Adult)  . Pulse 60  . Ht 157.5 cm (5\' 2" )  . Wt 95.5 kg (210 lb 9.6 oz)  . BMI 38.52 kg/m2   Ht:157.5 cm (5\' 2" ) Wt:95.5 kg (210 lb  9.6 oz) JXB:JYNW surface area is 2.04 meters squared. Body mass index is 38.52 kg/(m^2).  HEENT: Pupils equally reactive to light and accomodation  Neck: Supple without thyromegaly, carotid pulses 2+ Lungs: clear to auscultation bilaterally; no wheezes, rales, rhonchi Heart: Regular rate and rhythm. No gallops, murmurs or rub Abdomen: soft nontender, nondistended, with normal bowel sounds Extremities: no cyanosis, clubbing, or edema Peripheral Pulses: 2+ in all extremities, 2+ femoral pulses  bilaterally  Assessment   69 y.o. female with  1. Acute on chronic systolic heart failure  2. Cardiomyopathy  3. Left bundle-branch block  4. Automatic implantable cardioverter-defibrillator in situ  5. Biventricular ICD (implantable cardioverter-defibrillator) in place   69 year old female with nonischemic dilated cardiomyopathy, chronic left bundle branch block, chronic systolic congestive heart failure, status post Bi V ICD, with recent interrogation revealing elective replacement indication.  Plan   1. Continue current medications 2. Counseled patient about low-sodium diet 3. DASH diet printed instructions given to the patient 4. Proceed with Bi V ICD change out 5. Return to clinic after Bi V ICD change out  No orders of the defined types were placed in this encounter.  Return in about 2 weeks (around 02/23/2015), or after ICD changeout.  Marcina Millard, MD    Plan of Care Upcoming Encounters Upcoming Encounters  Date Type Specialty Providers Description  03/01/2015 Appointment Cardiology , Lyn Hollingshead, MD  [address]  [facility]  [place], [place] [address]  [phone]  [phone] (Fax)    03/01/2015 Appointment Cardiology Darrold Junker, Lyn Hollingshead, MD  [address]  [facility]  [place], [place] [address]  [phone]  [phone] (Fax)    Visit Diagnoses   Acute on chronic systolic heart failure - Primary Acute on chronic systolic heart failure   Cardiomyopathy   Left bundle-branch block   Automatic implantable cardioverter-defibrillator in situ   Biventricular ICD (implantable cardioverter-defibrillator) in place Document Information Primary Care Provider Farrel Demark (Jul. 31, 2015 - Present) [phone] (Work) [phone] (Fax)  [address] [place], [place] [address] Document Coverage Dates Nov. 09, 2016 - Nov. 09, 2016 Custodian  Organization [facility] [phone] (Work) [place], [place] [address] Encounter Providers Marcina Millard (Attending) [phone] (Work) [phone] (Fax)  [address] [facility] [place], [place] [address] Encounter Date Nov. 09, 2016 - Nov. 09, 2016 Printout Information Document Contents Office Visit Document Received Date 02/11/2015 Document Source Organization [facility] Patient Demographics  Reason for Visit  Encounter Details  Last Filed Vital Signs (In this Visit)  Instructions  Progress Notes  Plan of Care  Visit Diagnoses  Document Information Encounter Summary - Kadience Macchi (69 y.o. Female)As of Nov. 11, 2016 Patient Demographics Patient Address Communication Language Race / Ethnicity  [address] [place], [place] [address]  [phone] [facility]) [phone] [facility]) [phone] (Work)  English (Preferred) Caucasian/White / Not Hispanic/Latino  Reason for Visit Reason Comments  Follow-up PM alarm going off.  Encounter Details Date Type Department Care Team Description  02/09/2015 Office Visit [facility]  [address]  [place], [place] [address]  [phone]  Marcina Millard, MD  [address]  [facility]  [place], [place] [address]  [phone]  [phone] (Fax)  Acute on chronic systolic heart failure (Primary Dx);Cardiomyopathy;Left bundle-branch block;Automatic implantable cardioverter-defibrillator in situ;Biventricular ICD (implantable cardioverter-defibrillator) in place  Last Filed Vital Signs (In this Visit) Vital Sign Reading Time Taken  Blood Pressure 138/74 02/09/2015 1:30 PM EST  Pulse 60 02/09/2015 1:30 PM EST  Temperature - -  Respiratory Rate - -  Oxygen Saturation - -  Weight 95.5 kg (210 lb 9.6 oz) 02/09/2015 1:30 PM EST  Height 157.5 cm (5\' 2" ) 02/09/2015 1:30 PM EST  Body Mass Index 38.52 02/09/2015 1:30 PM  EST  Instructions Patient Instructions - Marcina Millard, MD - 02/09/2015 1:30 PM EST DASH Eating Plan DASH stands for "Dietary Approaches to Stop Hypertension." The DASH eating plan is a healthy eating plan that has been shown to reduce high blood pressure (hypertension). Additional health benefits may include reducing the risk of type 2 diabetes mellitus, heart disease, and stroke. The DASH eating plan may also help with weight loss. WHAT DO I NEED TO KNOW ABOUT THE DASH EATING PLAN? For the DASH eating plan, you will follow these general guidelines:  Choose foods with a percent daily value for sodium of less than 5% (as listed on the food label).  Use salt-free seasonings or herbs instead of table salt or sea salt.  Check with your health care provider or pharmacist before using salt substitutes.  Eat lower-sodium products, often labeled as "lower sodium" or "no salt added."  Eat fresh foods.  Eat more vegetables, fruits, and low-fat dairy products.  Choose whole grains. Look for the word "whole" as the first word in the ingredient list.  Choose fish and skinless chicken or [place] more often than red meat. Limit fish, poultry, and meat to 6 oz (170 g) each day.  Limit sweets, desserts, sugars, and sugary drinks.  Choose heart-healthy fats.  Limit cheese to 1 oz (28 g) per day.  Eat more home-cooked food and less restaurant, buffet, and fast food.  Limit fried foods.  Cook foods using methods other than frying.  Limit canned vegetables. If you do use them, rinse them well to decrease the sodium.  When eating at a restaurant, ask that your food be prepared with less salt, or no salt if possible. WHAT FOODS CAN I EAT? Seek help from a dietitian for individual calorie needs. Grains Whole grain or whole wheat bread. Brown rice. Whole grain or whole wheat pasta. Quinoa, bulgur, and whole grain cereals. Low-sodium cereals. Corn or whole wheat flour tortillas. Whole grain  cornbread. Whole grain crackers. Low-sodium crackers. Vegetables Fresh or frozen vegetables (raw, steamed, roasted, or grilled). Low-sodium or reduced-sodium tomato and vegetable juices. Low-sodium or reduced-sodium tomato sauce and paste. Low-sodium or reduced-sodium canned vegetables.  Fruits All fresh, canned (in natural juice), or frozen fruits. Meat and Other Protein Products Ground beef (85% or leaner), grass-fed beef, or beef trimmed of fat. Skinless chicken or [place]. Ground chicken or [place]. Pork trimmed of fat. All fish and seafood. Eggs. Dried beans, peas, or lentils. Unsalted nuts and seeds. Unsalted canned beans. Dairy Low-fat dairy products, such as skim or 1% milk, 2% or reduced-fat cheeses, low-fat ricotta or cottage cheese, or plain low-fat yogurt. Low-sodium or reduced-sodium cheeses. Fats and Oils Tub margarines without trans fats. Light or reduced-fat mayonnaise and salad dressings (reduced sodium). Avocado. Safflower, olive, or canola oils. Natural peanut or almond butter. Other Unsalted popcorn and pretzels. The items listed above may not be a complete list of recommended foods or beverages. Contact your dietitian for more options. WHAT FOODS ARE NOT RECOMMENDED? Grains White bread. White pasta. White rice. Refined cornbread. Bagels and croissants. Crackers that contain trans fat. Vegetables Creamed or fried vegetables. Vegetables in a cheese sauce. Regular canned vegetables. Regular canned tomato sauce and paste.  Regular tomato and vegetable juices. Fruits Dried fruits. Canned fruit in light or heavy syrup. Fruit juice. Meat and Other Protein Products Fatty cuts of meat. Ribs, chicken wings, bacon, sausage, bologna, salami, chitterlings, fatback, hot dogs, bratwurst, and packaged luncheon meats. Salted nuts and seeds. Canned beans with salt. Dairy Whole or 2% milk, cream, half-and-half, and cream cheese. Whole-fat or sweetened yogurt. Full-fat cheeses or blue cheese.  Nondairy creamers and whipped toppings. Processed cheese, cheese spreads, or cheese curds. Condiments Onion and garlic salt, seasoned salt, table salt, and sea salt. Canned and packaged gravies. Worcestershire sauce. Tartar sauce. Barbecue sauce. Teriyaki sauce. Soy sauce, including reduced sodium. Steak sauce. Fish sauce. Oyster sauce. Cocktail sauce. Horseradish. Ketchup and mustard. Meat flavorings and tenderizers. Bouillon cubes. Hot sauce. Tabasco sauce. Marinades. Taco seasonings. Relishes. Fats and Oils Butter, stick margarine, lard, shortening, ghee, and bacon fat. Coconut, palm kernel, or palm oils. Regular salad dressings. Other Pickles and olives. Salted popcorn and pretzels. The items listed above may not be a complete list of foods and beverages to avoid. Contact your dietitian for more information. WHERE CAN I FIND MORE INFORMATION? National Heart, Lung, and Blood Institute: [employer]Prom[employer].it  This information is not intended to replace advice given to you by your health care provider. Make sure you discuss any questions you have with your health care provider.  Document Released: 03/08/2011 Document Revised: 04/09/2014 Document Reviewed: 01/21/2013 Elsevier Interactive Patient Education 2016 Elsevier Inc.   Progress Notes Marcina Millard, MD - 02/09/2015 1:30 PM EST Formatting of this note may be different from the original. Established Patient Visit   Chief Complaint: Chief Complaint  Patient presents with  . Follow-up  PM alarm going off.  Date of Service: 02/09/2015 Date of Birth: 08/30/1945 PCP: Farrel Demark, MD  History of Present Illness: Ms. Marseille is a 69 y.o.female patient who returns for  1. Nonischemic dilated cardiomyopathy 2. Chronic systolic congestive heart failure 3. 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 CPAP 5. Morbid obesity  Patient denies chest pain. She does have chronic exertional dyspnea. She does  report her shortness of breath improves during the cool weather. She denies palpitations or heart racing. She denies peripheral edema. Echocardiogram 10/30/2013 revealed normal left ventricular function with LV ejection fraction of 45% with moderate aortic insufficiency. ICD interrogation on 01/25/2015 revealed that the Bi V ICD is a elective replacement indication.  Past Medical and Surgical History  Past Medical History Past Medical History  Diagnosis Date  . GERD (gastroesophageal reflux disease)  . Obesity  . Sleep apnea   Past Surgical History She has a past surgical history that includes Insert / replace / remove pacemaker.   Medications and Allergies  Current Medications  Current Outpatient Prescriptions  Medication Sig Dispense Refill  . acetaminophen (TYLENOL) 500 MG tablet Take 500 mg by mouth 2 (two) times daily.  Marland Kitchen aspirin 81 MG EC tablet Take 81 mg by mouth once daily.  . carvedilol (COREG) 25 MG tablet Take 1 tablet (25 mg total) by mouth 2 (two) times daily with meals. 180 tablet 4  . cyclobenzaprine (FLEXERIL) 5 MG tablet Take 5 mg by mouth 2 (two) times daily as needed for Muscle spasms.  Marland Kitchen ezetimibe (ZETIA) 10 mg tablet Take 1 tablet (10 mg total) by mouth once daily. 90 tablet 4  . lisinopril (PRINIVIL,ZESTRIL) 10 MG tablet Take 1 tablet (10 mg total) by mouth once daily. 90 tablet 4  . MV,IRON,MIN/FOLIC ACID/BIOTIN (HAIR, SKIN & NAILS- ARGAN OIL ORAL)  Take by mouth.  Marland Kitchen omeprazole (PRILOSEC) 20 MG DR capsule Take 1 capsule (20 mg total) by mouth once daily. 90 capsule 4   No current facility-administered medications for this visit.   Allergies: Review of patient's allergies indicates no known allergies.  Social and Family History  Social History reports that she has never smoked. She does not have any smokeless tobacco history on file. She reports that she does not drink alcohol or use illicit drugs.  Family History Family History  Problem Relation Age of Onset   . Cancer Mother  . Stroke Father   Review of Systems   Review of Systems: The patient denies chest pain, reports chronic exertional shortness of breath, without orthopnea, paroxysmal nocturnal dyspnea, pedal edema, palpitations, heart racing, presyncope, syncope. Review of 12 Systems is negative except as described above.  Physical Examination   Vitals: Visit Vitals  . BP 138/74 (BP Location: Left upper arm, Patient Position: Sitting, BP Cuff Size: Adult)  . Pulse 60  . Ht 157.5 cm (5\' 2" )  . Wt 95.5 kg (210 lb 9.6 oz)  . BMI 38.52 kg/m2   Ht:157.5 cm (5\' 2" ) Wt:95.5 kg (210 lb 9.6 oz) ZOX:WRUE surface area is 2.04 meters squared. Body mass index is 38.52 kg/(m^2).  HEENT: Pupils equally reactive to light and accomodation  Neck: Supple without thyromegaly, carotid pulses 2+ Lungs: clear to auscultation bilaterally; no wheezes, rales, rhonchi Heart: Regular rate and rhythm. No gallops, murmurs or rub Abdomen: soft nontender, nondistended, with normal bowel sounds Extremities: no cyanosis, clubbing, or edema Peripheral Pulses: 2+ in all extremities, 2+ femoral pulses bilaterally  Assessment   69 y.o. female with  1. Acute on chronic systolic heart failure  2. Cardiomyopathy  3. Left bundle-branch block  4. Automatic implantable cardioverter-defibrillator in situ  5. Biventricular ICD (implantable cardioverter-defibrillator) in place   69 year old female with nonischemic dilated cardiomyopathy, chronic left bundle branch block, chronic systolic congestive heart failure, status post Bi V ICD, with recent interrogation revealing elective replacement indication.  Plan   1. Continue current medications 2. Counseled patient about low-sodium diet 3. DASH diet printed instructions given to the patient 4. Proceed with Bi V ICD change out 5. Return to clinic after Bi V ICD change out  No orders of the defined types were placed in this encounter.  Return in about 2 weeks (around  02/23/2015), or after ICD changeout.  Marcina Millard, MD    Plan of Care Upcoming Encounters Upcoming Encounters  Date Type Specialty Providers Description  03/01/2015 Appointment Cardiology , Lyn Hollingshead, MD  [address]  [facility]  [place], [place] [address]  [phone]  [phone] (Fax)    03/01/2015 Appointment Cardiology Darrold Junker, Lyn Hollingshead, MD  [address]  [facility]  [place], [place] [address]  [phone]  [phone] (Fax)    Visit Diagnoses   Acute on chronic systolic heart failure - Primary Acute on chronic systolic heart failure   Cardiomyopathy   Left bundle-branch block   Automatic implantable cardioverter-defibrillator in situ   Biventricular ICD (implantable cardioverter-defibrillator) in place Document Information Primary Care Provider Farrel Demark (Jul. 31, 2015 - Present) [phone] (Work) [phone] (Fax)  [address] [place], [place] [address] Document Coverage Dates Nov. 09, 2016 - Nov. 09, 2016 Custodian Organization [facility] [phone] (Work) [place], [place] [address] Encounter Providers Marcina Millard (Attending) [phone] (Work) [phone] (Fax)  [address] [facility] [place], [place] [address] Encounter Date  Nov. 09, 2016 - Nov. 09, 2016 Printout Information Document Contents Office Visit Document Received Date 02/11/2015 Document Source Organization [facility] Patient Demographics  Reason for Visit  Encounter Details  Last Filed Vital Signs (In this Visit)  Instructions  Progress Notes  Plan of Care  Visit Diagnoses  Document Information Encounter Summary - Emma Guzman (69 y.o. Female)As of Nov. 11, 2016 Patient Demographics Patient Address Communication Language Race / Ethnicity  [address] [place], [place]  [address]  [phone] [facility]) [phone] [facility]) [phone] (Work)  English (Preferred) Caucasian/White / Not Hispanic/Latino  Reason for Visit Reason Comments  Follow-up PM alarm going off.  Encounter Details Date Type Department Care Team Description  02/09/2015 Office Visit [facility]  [address]  [place], [place] [address]  [phone]  Marcina Millard, MD  [address]  [facility]  [place], [place] [address]  [phone]  [phone] (Fax)  Acute on chronic systolic heart failure (Primary Dx);Cardiomyopathy;Left bundle-branch block;Automatic implantable cardioverter-defibrillator in situ;Biventricular ICD (implantable cardioverter-defibrillator) in place  Last Filed Vital Signs (In this Visit) Vital Sign Reading Time Taken  Blood Pressure 138/74 02/09/2015 1:30 PM EST  Pulse 60 02/09/2015 1:30 PM EST  Temperature - -  Respiratory Rate - -  Oxygen Saturation - -  Weight 95.5 kg (210 lb 9.6 oz) 02/09/2015 1:30 PM EST  Height 157.5 cm (5\' 2" ) 02/09/2015 1:30 PM EST  Body Mass Index 38.52 02/09/2015 1:30 PM EST  Instructions Patient Instructions - Marcina Millard, MD - 02/09/2015 1:30 PM EST DASH Eating Plan DASH stands for "Dietary Approaches to Stop Hypertension." The DASH eating plan is a healthy eating plan that has been shown to reduce high blood pressure (hypertension). Additional health benefits may include reducing the risk of type 2 diabetes mellitus, heart disease, and stroke. The DASH eating plan may also help with weight loss. WHAT DO I NEED TO KNOW ABOUT THE DASH EATING PLAN? For the DASH eating plan, you will follow these general guidelines:  Choose foods with a percent daily value for sodium of less than 5% (as listed on the food label).  Use salt-free seasonings or herbs instead of table salt or sea salt.  Check with your health care provider or pharmacist before using salt substitutes.  Eat  lower-sodium products, often labeled as "lower sodium" or "no salt added."  Eat fresh foods.  Eat more vegetables, fruits, and low-fat dairy products.  Choose whole grains. Look for the word "whole" as the first word in the ingredient list.  Choose fish and skinless chicken or [place] more often than red meat. Limit fish, poultry, and meat to 6 oz (170 g) each day.  Limit sweets, desserts, sugars, and sugary drinks.  Choose heart-healthy fats.  Limit cheese to 1 oz (28 g) per day.  Eat more home-cooked food and less restaurant, buffet, and fast food.  Limit fried foods.  Cook foods using methods other than frying.  Limit canned vegetables. If you do use them, rinse them well to decrease the sodium.  When eating at a restaurant, ask that your food be prepared with less salt, or no salt if possible. WHAT FOODS CAN I EAT? Seek help from a dietitian for individual calorie needs. Grains Whole grain or whole wheat bread. Brown rice. Whole grain or whole wheat pasta. Quinoa, bulgur, and whole grain cereals. Low-sodium cereals. Corn or whole wheat flour tortillas. Whole grain cornbread. Whole grain crackers. Low-sodium crackers. Vegetables Fresh or frozen vegetables (raw,  steamed, roasted, or grilled). Low-sodium or reduced-sodium tomato and vegetable juices. Low-sodium or reduced-sodium tomato sauce and paste. Low-sodium or reduced-sodium canned vegetables.  Fruits All fresh, canned (in natural juice), or frozen fruits. Meat and Other Protein Products Ground beef (85% or leaner), grass-fed beef, or beef trimmed of fat. Skinless chicken or [place]. Ground chicken or [place]. Pork trimmed of fat. All fish and seafood. Eggs. Dried beans, peas, or lentils. Unsalted nuts and seeds. Unsalted canned beans. Dairy Low-fat dairy products, such as skim or 1% milk, 2% or reduced-fat cheeses, low-fat ricotta or cottage cheese, or plain low-fat yogurt. Low-sodium or reduced-sodium cheeses. Fats and  Oils Tub margarines without trans fats. Light or reduced-fat mayonnaise and salad dressings (reduced sodium). Avocado. Safflower, olive, or canola oils. Natural peanut or almond butter. Other Unsalted popcorn and pretzels. The items listed above may not be a complete list of recommended foods or beverages. Contact your dietitian for more options. WHAT FOODS ARE NOT RECOMMENDED? Grains White bread. White pasta. White rice. Refined cornbread. Bagels and croissants. Crackers that contain trans fat. Vegetables Creamed or fried vegetables. Vegetables in a cheese sauce. Regular canned vegetables. Regular canned tomato sauce and paste. Regular tomato and vegetable juices. Fruits Dried fruits. Canned fruit in light or heavy syrup. Fruit juice. Meat and Other Protein Products Fatty cuts of meat. Ribs, chicken wings, bacon, sausage, bologna, salami, chitterlings, fatback, hot dogs, bratwurst, and packaged luncheon meats. Salted nuts and seeds. Canned beans with salt. Dairy Whole or 2% milk, cream, half-and-half, and cream cheese. Whole-fat or sweetened yogurt. Full-fat cheeses or blue cheese. Nondairy creamers and whipped toppings. Processed cheese, cheese spreads, or cheese curds. Condiments Onion and garlic salt, seasoned salt, table salt, and sea salt. Canned and packaged gravies. Worcestershire sauce. Tartar sauce. Barbecue sauce. Teriyaki sauce. Soy sauce, including reduced sodium. Steak sauce. Fish sauce. Oyster sauce. Cocktail sauce. Horseradish. Ketchup and mustard. Meat flavorings and tenderizers. Bouillon cubes. Hot sauce. Tabasco sauce. Marinades. Taco seasonings. Relishes. Fats and Oils Butter, stick margarine, lard, shortening, ghee, and bacon fat. Coconut, palm kernel, or palm oils. Regular salad dressings. Other Pickles and olives. Salted popcorn and pretzels. The items listed above may not be a complete list of foods and beverages to avoid. Contact your dietitian for more  information. WHERE CAN I FIND MORE INFORMATION? National Heart, Lung, and Blood Institute: [employer]Prom[employer]it  This information is not intended to replace advice given to you by your health care provider. Make sure you discuss any questions you have with your health care provider.  Document Released: 03/08/2011 Document Revised: 04/09/2014 Document Reviewed: 01/21/2013 Elsevier Interactive Patient Education 2016 Elsevier Inc.   Progress Notes Marcina Millard, MD - 02/09/2015 1:30 PM EST Formatting of this note may be different from the original. Established Patient Visit   Chief Complaint: Chief Complaint  Patient presents with  . Follow-up  PM alarm going off.  Date of Service: 02/09/2015 Date of Birth: Aug 19, 1945 PCP: Farrel Demark, MD  History of Present Illness: Ms. Sepulveda is a 69 y.o.female patient who returns for  1. Nonischemic dilated cardiomyopathy 2. Chronic systolic congestive heart failure 3. 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 CPAP 5. Morbid obesity  Patient denies chest pain. She does have chronic exertional dyspnea. She does report her shortness of breath improves during the cool weather. She denies palpitations or heart racing. She denies peripheral edema. Echocardiogram 10/30/2013 revealed normal left ventricular function with LV ejection fraction of 45% with moderate aortic insufficiency. ICD interrogation on 01/25/2015  revealed that the Bi V ICD is a elective replacement indication.  Past Medical and Surgical History  Past Medical History Past Medical History  Diagnosis Date  . GERD (gastroesophageal reflux disease)  . Obesity  . Sleep apnea   Past Surgical History She has a past surgical history that includes Insert / replace / remove pacemaker.   Medications and Allergies  Current Medications  Current Outpatient Prescriptions  Medication Sig Dispense Refill  . acetaminophen (TYLENOL) 500 MG tablet Take 500  mg by mouth 2 (two) times daily.  Marland Kitchen aspirin 81 MG EC tablet Take 81 mg by mouth once daily.  . carvedilol (COREG) 25 MG tablet Take 1 tablet (25 mg total) by mouth 2 (two) times daily with meals. 180 tablet 4  . cyclobenzaprine (FLEXERIL) 5 MG tablet Take 5 mg by mouth 2 (two) times daily as needed for Muscle spasms.  Marland Kitchen ezetimibe (ZETIA) 10 mg tablet Take 1 tablet (10 mg total) by mouth once daily. 90 tablet 4  . lisinopril (PRINIVIL,ZESTRIL) 10 MG tablet Take 1 tablet (10 mg total) by mouth once daily. 90 tablet 4  . MV,IRON,MIN/FOLIC ACID/BIOTIN (HAIR, SKIN & NAILS- ARGAN OIL ORAL) Take by mouth.  Marland Kitchen omeprazole (PRILOSEC) 20 MG DR capsule Take 1 capsule (20 mg total) by mouth once daily. 90 capsule 4   No current facility-administered medications for this visit.   Allergies: Review of patient's allergies indicates no known allergies.  Social and Family History  Social History reports that she has never smoked. She does not have any smokeless tobacco history on file. She reports that she does not drink alcohol or use illicit drugs.  Family History Family History  Problem Relation Age of Onset  . Cancer Mother  . Stroke Father   Review of Systems   Review of Systems: The patient denies chest pain, reports chronic exertional shortness of breath, without orthopnea, paroxysmal nocturnal dyspnea, pedal edema, palpitations, heart racing, presyncope, syncope. Review of 12 Systems is negative except as described above.  Physical Examination   Vitals: Visit Vitals  . BP 138/74 (BP Location: Left upper arm, Patient Position: Sitting, BP Cuff Size: Adult)  . Pulse 60  . Ht 157.5 cm (5\' 2" )  . Wt 95.5 kg (210 lb 9.6 oz)  . BMI 38.52 kg/m2   Ht:157.5 cm (5\' 2" ) Wt:95.5 kg (210 lb 9.6 oz) ZOX:WRUE surface area is 2.04 meters squared. Body mass index is 38.52 kg/(m^2).  HEENT: Pupils equally reactive to light and accomodation  Neck: Supple without thyromegaly, carotid pulses 2+ Lungs:  clear to auscultation bilaterally; no wheezes, rales, rhonchi Heart: Regular rate and rhythm. No gallops, murmurs or rub Abdomen: soft nontender, nondistended, with normal bowel sounds Extremities: no cyanosis, clubbing, or edema Peripheral Pulses: 2+ in all extremities, 2+ femoral pulses bilaterally  Assessment   69 y.o. female with  1. Acute on chronic systolic heart failure  2. Cardiomyopathy  3. Left bundle-branch block  4. Automatic implantable cardioverter-defibrillator in situ  5. Biventricular ICD (implantable cardioverter-defibrillator) in place   69 year old female with nonischemic dilated cardiomyopathy, chronic left bundle branch block, chronic systolic congestive heart failure, status post Bi V ICD, with recent interrogation revealing elective replacement indication.  Plan   1. Continue current medications 2. Counseled patient about low-sodium diet 3. DASH diet printed instructions given to the patient 4. Proceed with Bi V ICD change out 5. Return to clinic after Bi V ICD change out  No orders of the defined types were placed in  this encounter.  Return in about 2 weeks (around 02/23/2015), or after ICD changeout.  Marcina MillardALEXANDER , MD    Plan of Care Upcoming Encounters Upcoming Encounters  Date Type Specialty Providers Description  03/01/2015 Appointment Cardiology , Lyn Hollingshead, MD  [address]  [facility]  [place], [place] [address]  [phone]  [phone] (Fax)    03/01/2015 Appointment Cardiology Darrold Junker, Lyn Hollingshead, MD  [address]  [facility]  [place], [place] [address]  [phone]  [phone] (Fax)    Visit Diagnoses   Acute on chronic systolic heart failure - Primary Acute on chronic systolic heart failure   Cardiomyopathy   Left bundle-branch block   Automatic implantable cardioverter-defibrillator in situ   Biventricular ICD (implantable  cardioverter-defibrillator) in place Document Information Primary Care Provider Farrel DemarkDeanna Craven Jones (Jul. 31, 2015 - Present) [phone] (Work) [phone] (Fax)  [address] [place] [facility] [place], [place] [address] Document Coverage Dates Nov. 09, 2016 - Nov. 09, 2016 Custodian Organization [facility] [phone] (Work) [place], [place] [address] Encounter Providers Marcina Millard  (Attending) [phone] (Work) [phone] (Fax)  [address] [facility] [place], [place] [address] Encounter Date Nov. 09, 2016 - Nov. 09, 2016 Printout Information Document Contents Office Visit Document Received Date 02/11/2015 Document Source Organization [facility] Patient Demographics  Reason for Visit  Encounter Details  Last Filed Vital Signs (In this Visit)  Instructions  Progress Notes  Plan of Care  Visit Diagnoses  Document Information Encounter Summary - Emma Guzman (23(69 y.o. Female)As of Nov. 11, 2016 Patient Demographics Patient Address Communication Language Race / Ethnicity  [address] [place], [place] [address]  [phone] [facility]) [phone] [facility](Home) [phone] (Work)  English (Preferred) Caucasian/White / Not Hispanic/Latino  Reason for Visit Reason Comments  Follow-up PM alarm going off.  Encounter Details Date Type Department Care Team Description  02/09/2015 Office Visit [facility] [place]  [address]  [place], [place] [address]  [phone]  Marcina Millard, , MD  [address]  [facility]  [place], [place] [address]  [phone]  [phone] (Fax)  Acute on chronic systolic heart failure (Primary Dx);Cardiomyopathy;Left bundle-branch block;Automatic implantable cardioverter-defibrillator in situ;Biventricular ICD (implantable cardioverter-defibrillator) in place  Last Filed Vital Signs (In this Visit) Vital Sign  Reading Time Taken  Blood Pressure 138/74 02/09/2015 1:30 PM EST  Pulse 60 02/09/2015 1:30 PM EST  Temperature - -  Respiratory Rate - -  Oxygen Saturation - -  Weight 95.5 kg (210 lb 9.6 oz) 02/09/2015 1:30 PM EST  Height 157.5 cm (5\' 2" ) 02/09/2015 1:30 PM EST  Body Mass Index 38.52 02/09/2015 1:30 PM EST  Instructions Patient Instructions - Marcina Millard, , MD - 02/09/2015 1:30 PM EST DASH Eating Plan DASH stands for "Dietary Approaches to Stop Hypertension." The DASH eating plan is a healthy eating plan that has been shown to reduce high blood pressure (hypertension). Additional health benefits may include reducing the risk of type 2 diabetes mellitus, heart disease, and stroke. The DASH eating plan may also help with weight loss. WHAT DO I NEED TO KNOW ABOUT THE DASH EATING PLAN? For the DASH eating plan, you will follow these general guidelines:  Choose foods with a percent daily value for sodium of less than 5% (as listed on the food label).  Use salt-free seasonings or herbs instead of table salt or sea salt.  Check with your health care provider or pharmacist before using salt substitutes.  Eat lower-sodium products, often  labeled as "lower sodium" or "no salt added."  Eat fresh foods.  Eat more vegetables, fruits, and low-fat dairy products.  Choose whole grains. Look for the word "whole" as the first word in the ingredient list.  Choose fish and skinless chicken or [place] more often than red meat. Limit fish, poultry, and meat to 6 oz (170 g) each day.  Limit sweets, desserts, sugars, and sugary drinks.  Choose heart-healthy fats.  Limit cheese to 1 oz (28 g) per day.  Eat more home-cooked food and less restaurant, buffet, and fast food.  Limit fried foods.  Cook foods using methods other than frying.  Limit canned vegetables. If you do use them, rinse them well to decrease the sodium.  When eating at a restaurant, ask that your food be prepared with less  salt, or no salt if possible. WHAT FOODS CAN I EAT? Seek help from a dietitian for individual calorie needs. Grains Whole grain or whole wheat bread. Brown rice. Whole grain or whole wheat pasta. Quinoa, bulgur, and whole grain cereals. Low-sodium cereals. Corn or whole wheat flour tortillas. Whole grain cornbread. Whole grain crackers. Low-sodium crackers. Vegetables Fresh or frozen vegetables (raw, steamed, roasted, or grilled). Low-sodium or reduced-sodium tomato and vegetable juices. Low-sodium or reduced-sodium tomato sauce and paste. Low-sodium or reduced-sodium canned vegetables.  Fruits All fresh, canned (in natural juice), or frozen fruits. Meat and Other Protein Products Ground beef (85% or leaner), grass-fed beef, or beef trimmed of fat. Skinless chicken or [place]. Ground chicken or [place]. Pork trimmed of fat. All fish and seafood. Eggs. Dried beans, peas, or lentils. Unsalted nuts and seeds. Unsalted canned beans. Dairy Low-fat dairy products, such as skim or 1% milk, 2% or reduced-fat cheeses, low-fat ricotta or cottage cheese, or plain low-fat yogurt. Low-sodium or reduced-sodium cheeses. Fats and Oils Tub margarines without trans fats. Light or reduced-fat mayonnaise and salad dressings (reduced sodium). Avocado. Safflower, olive, or canola oils. Natural peanut or almond butter. Other Unsalted popcorn and pretzels. The items listed above may not be a complete list of recommended foods or beverages. Contact your dietitian for more options. WHAT FOODS ARE NOT RECOMMENDED? Grains White bread. White pasta. White rice. Refined cornbread. Bagels and croissants. Crackers that contain trans fat. Vegetables Creamed or fried vegetables. Vegetables in a cheese sauce. Regular canned vegetables. Regular canned tomato sauce and paste. Regular tomato and vegetable juices. Fruits Dried fruits. Canned fruit in light or heavy syrup. Fruit juice. Meat and Other Protein Products Fatty cuts of  meat. Ribs, chicken wings, bacon, sausage, bologna, salami, chitterlings, fatback, hot dogs, bratwurst, and packaged luncheon meats. Salted nuts and seeds. Canned beans with salt. Dairy Whole or 2% milk, cream, half-and-half, and cream cheese. Whole-fat or sweetened yogurt. Full-fat cheeses or blue cheese. Nondairy creamers and whipped toppings. Processed cheese, cheese spreads, or cheese curds. Condiments Onion and garlic salt, seasoned salt, table salt, and sea salt. Canned and packaged gravies. Worcestershire sauce. Tartar sauce. Barbecue sauce. Teriyaki sauce. Soy sauce, including reduced sodium. Steak sauce. Fish sauce. Oyster sauce. Cocktail sauce. Horseradish. Ketchup and mustard. Meat flavorings and tenderizers. Bouillon cubes. Hot sauce. Tabasco sauce. Marinades. Taco seasonings. Relishes. Fats and Oils Butter, stick margarine, lard, shortening, ghee, and bacon fat. Coconut, palm kernel, or palm oils. Regular salad dressings. Other Pickles and olives. Salted popcorn and pretzels. The items listed above may not be a complete list of foods and beverages to avoid. Contact your dietitian for more information. WHERE CAN I FIND MORE INFORMATION? National  Heart, Lung, and Blood Institute: [employer]it  This information is not intended to replace advice given to you by your health care provider. Make sure you discuss any questions you have with your health care provider.  Document Released: 03/08/2011 Document Revised: 04/09/2014 Document Reviewed: 01/21/2013 Elsevier Interactive Patient Education 2016 Elsevier Inc.   Progress Notes Marcina Millard, MD - 02/09/2015 1:30 PM EST Formatting of this note may be different from the original. Established Patient Visit   Chief Complaint: Chief Complaint  Patient presents with  . Follow-up  PM alarm going off.  Date of Service: 02/09/2015 Date of Birth: Oct 30, 1945 PCP: Farrel Demark, MD   History of Present Illness: Ms. Burrowes is a 69 y.o.female patient who returns for  1. Nonischemic dilated cardiomyopathy 2. Chronic systolic congestive heart failure 3. 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 CPAP 5. Morbid obesity  Patient denies chest pain. She does have chronic exertional dyspnea. She does report her shortness of breath improves during the cool weather. She denies palpitations or heart racing. She denies peripheral edema. Echocardiogram 10/30/2013 revealed normal left ventricular function with LV ejection fraction of 45% with moderate aortic insufficiency. ICD interrogation on 01/25/2015 revealed that the Bi V ICD is a elective replacement indication.  Past Medical and Surgical History  Past Medical History Past Medical History  Diagnosis Date  . GERD (gastroesophageal reflux disease)  . Obesity  . Sleep apnea   Past Surgical History She has a past surgical history that includes Insert / replace / remove pacemaker.   Medications and Allergies  Current Medications  Current Outpatient Prescriptions  Medication Sig Dispense Refill  . acetaminophen (TYLENOL) 500 MG tablet Take 500 mg by mouth 2 (two) times daily.  Marland Kitchen aspirin 81 MG EC tablet Take 81 mg by mouth once daily.  . carvedilol (COREG) 25 MG tablet Take 1 tablet (25 mg total) by mouth 2 (two) times daily with meals. 180 tablet 4  . cyclobenzaprine (FLEXERIL) 5 MG tablet Take 5 mg by mouth 2 (two) times daily as needed for Muscle spasms.  Marland Kitchen ezetimibe (ZETIA) 10 mg tablet Take 1 tablet (10 mg total) by mouth once daily. 90 tablet 4  . lisinopril (PRINIVIL,ZESTRIL) 10 MG tablet Take 1 tablet (10 mg total) by mouth once daily. 90 tablet 4  . MV,IRON,MIN/FOLIC ACID/BIOTIN (HAIR, SKIN & NAILS- ARGAN OIL ORAL) Take by mouth.  Marland Kitchen omeprazole (PRILOSEC) 20 MG DR capsule Take 1 capsule (20 mg total) by mouth once daily. 90 capsule 4   No current facility-administered medications for this visit.   Allergies: Review of  patient's allergies indicates no known allergies.  Social and Family History  Social History reports that she has never smoked. She does not have any smokeless tobacco history on file. She reports that she does not drink alcohol or use illicit drugs.  Family History Family History  Problem Relation Age of Onset  . Cancer Mother  . Stroke Father   Review of Systems   Review of Systems: The patient denies chest pain, reports chronic exertional shortness of breath, without orthopnea, paroxysmal nocturnal dyspnea, pedal edema, palpitations, heart racing, presyncope, syncope. Review of 12 Systems is negative except as described above.  Physical Examination   Vitals: Visit Vitals  . BP 138/74 (BP Location: Left upper arm, Patient Position: Sitting, BP Cuff Size: Adult)  . Pulse 60  . Ht 157.5 cm ( )  . Wt 95.5 kg (210 lb 9.6 oz)  . BMI 38.52 kg/m2   Ht:157.5  cm (5\' 2" ) Wt:95.5 kg (210 lb 9.6 oz) GMW:NUUV surface area is 2.04 meters squared. Body mass index is 38.52 kg/(m^2).  HEENT: Pupils equally reactive to light and accomodation  Neck: Supple without thyromegaly, carotid pulses 2+ Lungs: clear to auscultation bilaterally; no wheezes, rales, rhonchi Heart: Regular rate and rhythm. No gallops, murmurs or rub Abdomen: soft nontender, nondistended, with normal bowel sounds Extremities: no cyanosis, clubbing, or edema Peripheral Pulses: 2+ in all extremities, 2+ femoral pulses bilaterally  Assessment   69 y.o. female with  1. Acute on chronic systolic heart failure  2. Cardiomyopathy  3. Left bundle-branch block  4. Automatic implantable cardioverter-defibrillator in situ  5. Biventricular ICD (implantable cardioverter-defibrillator) in place   69 year old female with nonischemic dilated cardiomyopathy, chronic left bundle branch block, chronic systolic congestive heart failure, status post Bi V ICD, with recent interrogation revealing elective replacement  indication.  Plan   1. Continue current medications 2. Counseled patient about low-sodium diet 3. DASH diet printed instructions given to the patient 4. Proceed with Bi V ICD change out 5. Return to clinic after Bi V ICD change out  No orders of the defined types were placed in this encounter.  Return in about 2 weeks (around 02/23/2015), or after ICD changeout.  Marcina Millard, MD    Plan of Care Upcoming Encounters Upcoming Encounters  Date Type Specialty Providers Description  03/01/2015 Appointment Cardiology , Lyn Hollingshead, MD  [address]  [facility]  [place], [place] [address]  [phone]  [phone] (Fax)    03/01/2015 Appointment Cardiology Darrold Junker, Lyn Hollingshead, MD  [address]  [facility]  [place], [place] [address]  [phone]  [phone] (Fax)    Visit Diagnoses   Acute on chronic systolic heart failure - Primary Acute on chronic systolic heart failure   Cardiomyopathy   Left bundle-branch block   Automatic implantable cardioverter-defibrillator in situ   Biventricular ICD (implantable cardioverter-defibrillator) in place Document Information Primary Care Provider Farrel Demark (Jul. 31, 2015 - Present) [phone] (Work) [phone] (Fax)  [address] [place], [place] [address] Document Coverage Dates Nov. 09, 2016 - Nov. 09, 2016 Custodian Organization [facility] [phone] (Work) [place], [place] [address] Encounter Providers Marcina Millard (Attending) [phone] (Work) [phone] (Fax)  [address] [facility] [place], [place] [address] Encounter Date Nov. 09, 2016 - Nov. 09, 2016

## 2015-02-15 NOTE — Op Note (Signed)
[facility]   02/15/2015                     1:18 PM  PATIENT:  Emma SplinterMargaret P Langlais    PRE-OPERATIVE DIAGNOSIS:  CONGESTIVE HEART FAILURE  POST-OPERATIVE DIAGNOSIS:  Same  PROCEDURE:  ICD GENERATOR CHANGE  SURGEON:  Marcina MillardPARASCHOS,, MD    ANESTHESIA:     PREOPERATIVE INDICATIONS:  Emma Guzman is a  69 y.o. female with a diagnosis of CONGESTIVE HEART FAILURE who failed conservative measures and elected for surgical management.    The risks benefits and alternatives were discussed with the patient preoperatively including but not limited to the risks of infection, bleeding, cardiopulmonary complications, the need for revision surgery, among others, and the patient was willing to proceed.   OPERATIVE PROCEDURE: The patient was brought to the operating room fasting state. The left pectoral region was prepped and draped in the usual sterile manner. Anesthesia was obtained with 1% lidocaine locally. A 6 cm incision was performed over the left pectoral region. The old BiV ICD generator was retrieved by electrocautery and blunt dissection. The leads were disconnected and connected to a new BiV ICD generator ( Viva XT CRT-D ), and positioned the pocket. The pocket was closed with 204 0 Vicryl, respectively. Steri-Strips and a pressure dressing were applied.

## 2015-02-15 NOTE — Discharge Instructions (Signed)
May shower on Thursday May remove outer dressing tomorrow May drive on Friday F/u appt for first available in [place] office  AMBULATORY SURGERY  DISCHARGE INSTRUCTIONS   1) The drugs that you were given will stay in your system until tomorrow so for the next 24 hours you should not:  A) Drive an automobile B) Make any legal decisions C) Drink any alcoholic beverage   2) You may resume regular meals tomorrow.  Today it is better to start with liquids and gradually work up to solid foods.  You may eat anything you prefer, but it is better to start with liquids, then soup and crackers, and gradually work up to solid foods.   3) Please notify your doctor immediately if you have any unusual bleeding, trouble breathing, redness and pain at the surgery site, drainage, fever, or pain not relieved by medication.    4) Additional Instructions:        Please contact your physician with any problems or Same Day Surgery at [phone], Monday through Friday 6 am to 4 pm, or [facility] Main number at [phone].

## 2015-02-15 NOTE — Progress Notes (Signed)
Pt. Requested not to have her iv in her hand or wrist.   Honored patients request.

## 2015-02-15 NOTE — Anesthesia Preprocedure Evaluation (Addendum)
Anesthesia Evaluation  Patient identified by MRN, date of birth, ID band Patient awake    Reviewed: Allergy & Precautions, NPO status , Patient's Chart, lab work & pertinent test results  History of Anesthesia Complications Negative for: history of anesthetic complications  Airway Mallampati: III       Dental  (+) Poor Dentition, Partial Lower, Lower Dentures   Pulmonary neg pulmonary ROS,           Cardiovascular hypertension, Pt. on medications and Pt. on home beta blockers +CHF  + dysrhythmias (LBBB)      Neuro/Psych negative neurological ROS     GI/Hepatic GERD  Medicated,  Endo/Other  diabetes, Poorly Controlled, Type 2  Renal/GU Renal InsufficiencyRenal disease     Musculoskeletal  (+) Arthritis , Osteoarthritis,    Abdominal   Peds  Hematology   Anesthesia Other Findings   Reproductive/Obstetrics                            Anesthesia Physical Anesthesia Plan  ASA: III  Anesthesia Plan: General   Post-op Pain Management:    Induction: Intravenous  Airway Management Planned:   Additional Equipment:   Intra-op Plan:   Post-operative Plan:   Informed Consent: I have reviewed the patients History and Physical, chart, labs and discussed the procedure including the risks, benefits and alternatives for the proposed anesthesia with the patient or authorized representative who has indicated his/her understanding and acceptance.     Plan Discussed with:   Anesthesia Plan Comments:         Anesthesia Quick Evaluation

## 2015-02-15 NOTE — Transfer of Care (Signed)
Immediate Anesthesia Transfer of Care Note  Patient: Emma Guzman  Procedure(s) Performed: Procedure(s): ICD GENERATOR CHANGE (N/A)  Patient Location: PACU  Anesthesia Type:General  Level of Consciousness: awake, alert  and oriented  Airway & Oxygen Therapy: Patient Spontanous Breathing and Patient connected to face mask oxygen  Post-op Assessment: Report given to RN and Post -op Vital signs reviewed and stable  Post vital signs: Reviewed and stable  Last Vitals:  Filed Vitals:   02/15/15 1037  BP: 134/87  Pulse: 73  Temp: 37 C  Resp: 16    Complications: No apparent anesthesia complications

## 2015-02-22 DIAGNOSIS — Z95810 Presence of automatic (implantable) cardiac defibrillator: Secondary | ICD-10-CM | POA: Diagnosis not present

## 2015-02-22 DIAGNOSIS — I5023 Acute on chronic systolic (congestive) heart failure: Secondary | ICD-10-CM | POA: Diagnosis not present

## 2015-02-22 DIAGNOSIS — I447 Left bundle-branch block, unspecified: Secondary | ICD-10-CM | POA: Diagnosis not present

## 2015-02-22 DIAGNOSIS — I429 Cardiomyopathy, unspecified: Secondary | ICD-10-CM | POA: Diagnosis not present

## 2015-03-04 DIAGNOSIS — H25013 Cortical age-related cataract, bilateral: Secondary | ICD-10-CM | POA: Diagnosis not present

## 2015-03-08 ENCOUNTER — Other Ambulatory Visit: Payer: Self-pay

## 2015-03-08 DIAGNOSIS — H25013 Cortical age-related cataract, bilateral: Secondary | ICD-10-CM | POA: Diagnosis not present

## 2015-03-09 ENCOUNTER — Encounter: Payer: Self-pay | Admitting: *Deleted

## 2015-03-11 NOTE — Discharge Instructions (Signed)

## 2015-03-14 ENCOUNTER — Ambulatory Visit
Admission: RE | Admit: 2015-03-14 | Discharge: 2015-03-14 | Disposition: A | Discharge status: Home / Self Care | Payer: Medicare Other | Source: Ambulatory Visit | Attending: Ophthalmology | Admitting: Ophthalmology

## 2015-03-14 ENCOUNTER — Ambulatory Visit: Payer: Medicare Other | Admitting: Anesthesiology

## 2015-03-14 ENCOUNTER — Encounter
Admission: RE | Disposition: A | Discharge status: Home / Self Care | Payer: Self-pay | Source: Ambulatory Visit | Attending: Ophthalmology

## 2015-03-14 DIAGNOSIS — R011 Cardiac murmur, unspecified: Secondary | ICD-10-CM | POA: Diagnosis not present

## 2015-03-14 DIAGNOSIS — H2512 Age-related nuclear cataract, left eye: Secondary | ICD-10-CM | POA: Diagnosis not present

## 2015-03-14 DIAGNOSIS — Z7982 Long term (current) use of aspirin: Secondary | ICD-10-CM | POA: Diagnosis not present

## 2015-03-14 DIAGNOSIS — Z950 Presence of cardiac pacemaker: Secondary | ICD-10-CM | POA: Insufficient documentation

## 2015-03-14 DIAGNOSIS — G4730 Sleep apnea, unspecified: Secondary | ICD-10-CM | POA: Insufficient documentation

## 2015-03-14 DIAGNOSIS — I10 Essential (primary) hypertension: Secondary | ICD-10-CM | POA: Diagnosis not present

## 2015-03-14 DIAGNOSIS — Z9889 Other specified postprocedural states: Secondary | ICD-10-CM | POA: Diagnosis not present

## 2015-03-14 DIAGNOSIS — H25013 Cortical age-related cataract, bilateral: Secondary | ICD-10-CM | POA: Diagnosis not present

## 2015-03-14 DIAGNOSIS — E119 Type 2 diabetes mellitus without complications: Secondary | ICD-10-CM | POA: Diagnosis not present

## 2015-03-14 DIAGNOSIS — Z79899 Other long term (current) drug therapy: Secondary | ICD-10-CM | POA: Insufficient documentation

## 2015-03-14 DIAGNOSIS — H269 Unspecified cataract: Secondary | ICD-10-CM | POA: Diagnosis present

## 2015-03-14 HISTORY — PX: CATARACT EXTRACTION W/PHACO: SHX586

## 2015-03-14 HISTORY — DX: Presence of dental prosthetic device (complete) (partial): Z97.2

## 2015-03-14 HISTORY — DX: Unspecified hearing loss, unspecified ear: H91.90

## 2015-03-14 SURGERY — PHACOEMULSIFICATION, CATARACT, WITH IOL INSERTION
Anesthesia: Monitor Anesthesia Care | Laterality: Left | Wound class: Clean

## 2015-03-14 MED ORDER — BALANCED SALT IO SOLN
INTRAOCULAR | Status: DC | PRN
  Administered 2015-03-14: 1 mL via OPHTHALMIC

## 2015-03-14 MED ORDER — EPINEPHRINE HCL 1 MG/ML IJ SOLN
INTRAMUSCULAR | Status: DC | PRN
  Administered 2015-03-14: 92 mL via OPHTHALMIC

## 2015-03-14 MED ORDER — ACETAMINOPHEN 325 MG PO TABS: 325.0000 mg | ORAL_TABLET | ORAL | Status: DC | PRN

## 2015-03-14 MED ORDER — FENTANYL CITRATE (PF) 100 MCG/2ML IJ SOLN
INTRAMUSCULAR | Status: DC | PRN
  Administered 2015-03-14 (×2): 50 ug via INTRAVENOUS

## 2015-03-14 MED ORDER — ARMC OPHTHALMIC DILATING GEL
1.0000 "application " | OPHTHALMIC | Status: DC | PRN
  Administered 2015-03-14 (×2): 1 via OPHTHALMIC

## 2015-03-14 MED ORDER — MIDAZOLAM HCL 2 MG/2ML IJ SOLN
INTRAMUSCULAR | Status: DC | PRN
  Administered 2015-03-14: 2 mg via INTRAVENOUS

## 2015-03-14 MED ORDER — LACTATED RINGERS IV SOLN: INTRAVENOUS | Status: DC

## 2015-03-14 MED ORDER — CEFUROXIME OPHTHALMIC INJECTION 1 MG/0.1 ML
INJECTION | OPHTHALMIC | Status: DC | PRN
  Administered 2015-03-14: .2 mL via INTRACAMERAL

## 2015-03-14 MED ORDER — POVIDONE-IODINE 5 % OP SOLN
1.0000 "application " | OPHTHALMIC | Status: DC | PRN
  Administered 2015-03-14: 1 via OPHTHALMIC

## 2015-03-14 MED ORDER — NA HYALUR & NA CHOND-NA HYALUR 0.4-0.35 ML IO KIT
PACK | INTRAOCULAR | Status: DC | PRN
  Administered 2015-03-14: 1 mL via INTRAOCULAR

## 2015-03-14 MED ORDER — ACETAMINOPHEN 160 MG/5ML PO SOLN: 325.0000 mg | ORAL | Status: DC | PRN

## 2015-03-14 MED ORDER — TIMOLOL MALEATE 0.5 % OP SOLN
OPHTHALMIC | Status: DC | PRN
  Administered 2015-03-14: 1 [drp] via OPHTHALMIC

## 2015-03-14 MED ORDER — BRIMONIDINE TARTRATE 0.2 % OP SOLN
OPHTHALMIC | Status: DC | PRN
  Administered 2015-03-14: 1 [drp] via OPHTHALMIC

## 2015-03-14 MED ORDER — TETRACAINE HCL 0.5 % OP SOLN
1.0000 [drp] | OPHTHALMIC | Status: DC | PRN
  Administered 2015-03-14: 1 [drp] via OPHTHALMIC

## 2015-03-14 SURGICAL SUPPLY — 32 items
APL FBRTP 3 NS LF CTTN WD (MISCELLANEOUS) ×1
APPLICATOR COTTON TIP 3IN (MISCELLANEOUS) ×3 IMPLANT
CANNULA ANT/CHMB 27G (MISCELLANEOUS) ×1 IMPLANT
CANNULA ANT/CHMB 27GA (MISCELLANEOUS) ×3 IMPLANT
DISSECTOR HYDRO NUCLEUS 50X22 (MISCELLANEOUS) ×3 IMPLANT
GLOVE BIO SURGEON STRL SZ7 (GLOVE) ×3 IMPLANT
GLOVE SURG LX 6.5 MICRO (GLOVE) ×2
GLOVE SURG LX STRL 6.5 MICRO (GLOVE) ×1 IMPLANT
GOWN STRL REUS W/ TWL LRG LVL3 (GOWN DISPOSABLE) ×2 IMPLANT
GOWN STRL REUS W/TWL LRG LVL3 (GOWN DISPOSABLE) ×6
LENS IOL ACRSF IQ PC 20.0 (Intraocular Lens) IMPLANT
LENS IOL ACRYSOF IQ POST 20.0 (Intraocular Lens) ×3 IMPLANT
MARKER SKIN SURG W/RULER VIO (MISCELLANEOUS) ×3 IMPLANT
NDL FILTER BLUNT 18X1 1/2 (NEEDLE) ×1 IMPLANT
NEEDLE FILTER BLUNT 18X 1/2SAF (NEEDLE) ×2
NEEDLE FILTER BLUNT 18X1 1/2 (NEEDLE) ×1 IMPLANT
PACK CATARACT BRASINGTON (MISCELLANEOUS) ×3 IMPLANT
PACK EYE AFTER SURG (MISCELLANEOUS) ×3 IMPLANT
PACK OPTHALMIC (MISCELLANEOUS) ×3 IMPLANT
RING MALYGIN 7.0 (MISCELLANEOUS) IMPLANT
SOL BAL SALT 15ML (MISCELLANEOUS)
SOLUTION BAL SALT 15ML (MISCELLANEOUS) IMPLANT
SUT ETHILON 10-0 CS-B-6CS-B-6 (SUTURE)
SUT VICRYL  9 0 (SUTURE)
SUT VICRYL 9 0 (SUTURE) IMPLANT
SUTURE EHLN 10-0 CS-B-6CS-B-6 (SUTURE) IMPLANT
SYR 3ML LL SCALE MARK (SYRINGE) ×3 IMPLANT
SYR TB 1ML LUER SLIP (SYRINGE) ×3 IMPLANT
WATER STERILE IRR 250ML POUR (IV SOLUTION) ×3 IMPLANT
WATER STERILE IRR 500ML POUR (IV SOLUTION) IMPLANT
WICK EYE OCUCEL (MISCELLANEOUS) IMPLANT
WIPE NON LINTING 3.25X3.25 (MISCELLANEOUS) ×3 IMPLANT

## 2015-03-14 NOTE — Anesthesia Preprocedure Evaluation (Signed)
Anesthesia Evaluation  Patient identified by MRN, date of birth, ID band  Reviewed: Allergy & Precautions, H&P , NPO status , Patient's Chart, lab work & pertinent test results  Airway Mallampati: II  TM Distance: >3 FB Neck ROM: full    Dental   Pulmonary sleep apnea ,    Pulmonary exam normal        Cardiovascular Exercise Tolerance: Poor hypertension, +CHF  + Cardiac Defibrillator  Rhythm:regular Rate:Normal     Neuro/Psych    GI/Hepatic   Endo/Other  diabetes  Renal/GU Renal disease     Musculoskeletal   Abdominal   Peds  Hematology   Anesthesia Other Findings   Reproductive/Obstetrics                             Anesthesia Physical Anesthesia Plan  ASA: IV  Anesthesia Plan: MAC   Post-op Pain Management:    Induction:   Airway Management Planned:   Additional Equipment:   Intra-op Plan:   Post-operative Plan:   Informed Consent: I have reviewed the patients History and Physical, chart, labs and discussed the procedure including the risks, benefits and alternatives for the proposed anesthesia with the patient or authorized representative who has indicated his/her understanding and acceptance.     Plan Discussed with: CRNA  Anesthesia Plan Comments:         Anesthesia Quick Evaluation

## 2015-03-14 NOTE — Op Note (Signed)
Date of Surgery: 03/14/2015  PREOPERATIVE DIAGNOSES: Visually significant cortical cataract, left eye.  POSTOPERATIVE DIAGNOSES: Same  PROCEDURES PERFORMED: Cataract extraction with intraocular lens implant, left eye.  SURGEON: Devin GoingAnita P. Vin, M.D.  ANESTHESIA: MAC and topical  IMPLANTS: AcrySof IQ [id number] +20.0 D   Implant Name Type Inv. Item Serial No. Manufacturer Lot No. LRB No. Used  IMPLANT LENS - [id number] Intraocular Lens IMPLANT LENS [id number] ALCON   Left 1    COMPLICATIONS: None.  DESCRIPTION OF PROCEDURE: Therapeutic options were discussed with the patient preoperatively, including a discussion of risks and benefits of surgery. Informed consent was obtained. An IOL-Master and immersion biometry were used to take the lens measurements, and a dilated fundus exam was performed within 6 months of the surgical date.  The patient was premedicated and brought to the operating room and placed on the operating table in the supine position. After adequate anesthesia, the patient was prepped and draped in the usual sterile ophthalmic fashion. A wire lid speculum was inserted and the microscope was positioned. A Superblade was used to create a paracentesis site at the limbus and a small amount of dilute preservative free lidocaine was instilled into the anterior chamber, followed by dispersive viscoelastic. A clear corneal incision was created temporally using a 2.4 mm keratome blade. Capsulorrhexis was then performed. In situ phacoemulsification was performed.  Cortical material was removed with the irrigation-aspiration unit. Dispersive viscoelastic was instilled to open the capsular bag. A posterior chamber intraocular lens with the specifications above was inserted and positioned. Irrigation-aspiration was used to remove all viscoelastic. Cefuroxime 1cc was instilled into the anterior chamber, and the corneal incision was checked and found to be water tight. The eyelid speculum was  removed.  The operative eye was covered with protective goggles after instilling 1 drop of timolol and brimonidine. The patient tolerated the procedure well. There were no complications.

## 2015-03-14 NOTE — Anesthesia Procedure Notes (Signed)
Procedure Name: MAC Performed by: ,  Pre-anesthesia Checklist: Patient identified, Emergency Drugs available, Suction available, Timeout performed and Patient being monitored Patient Re-evaluated:Patient Re-evaluated prior to inductionOxygen Delivery Method: Nasal cannula Placement Confirmation: positive ETCO2       

## 2015-03-14 NOTE — Anesthesia Postprocedure Evaluation (Signed)
Anesthesia Post Note  Patient: Emma Guzman  Procedure(s) Performed: Procedure(s) (LRB): CATARACT EXTRACTION PHACO AND INTRAOCULAR LENS PLACEMENT (IOC) (Left)  Patient location during evaluation: PACU Anesthesia Type: MAC Level of consciousness: awake and alert and oriented Pain management: satisfactory to patient Vital Signs Assessment: post-procedure vital signs reviewed and stable Respiratory status: spontaneous breathing, nonlabored ventilation and respiratory function stable Cardiovascular status: blood pressure returned to baseline and stable Postop Assessment: Adequate PO intake and No signs of nausea or vomiting Anesthetic complications: no    [name],  J

## 2015-03-14 NOTE — Transfer of Care (Signed)
Immediate Anesthesia Transfer of Care Note  Patient: Emma SplinterMargaret P Thivierge  Procedure(s) Performed: Procedure(s) with comments: CATARACT EXTRACTION PHACO AND INTRAOCULAR LENS PLACEMENT (IOC) (Left) - DIABETIC - diet controlled  Patient Location: PACU  Anesthesia Type: MAC  Level of Consciousness: awake, alert  and patient cooperative  Airway and Oxygen Therapy: Patient Spontanous Breathing and Patient connected to supplemental oxygen  Post-op Assessment: Post-op Vital signs reviewed, Patient's Cardiovascular Status Stable, Respiratory Function Stable, Patent Airway and No signs of Nausea or vomiting  Post-op Vital Signs: Reviewed and stable  Complications: No apparent anesthesia complications

## 2015-03-15 ENCOUNTER — Encounter: Payer: Self-pay | Admitting: Ophthalmology

## 2015-04-12 DIAGNOSIS — I5023 Acute on chronic systolic (congestive) heart failure: Secondary | ICD-10-CM | POA: Diagnosis not present

## 2015-04-29 DIAGNOSIS — Z961 Presence of intraocular lens: Secondary | ICD-10-CM | POA: Diagnosis not present

## 2015-05-27 ENCOUNTER — Encounter: Payer: Self-pay | Admitting: Ophthalmology

## 2015-07-05 DIAGNOSIS — I5023 Acute on chronic systolic (congestive) heart failure: Secondary | ICD-10-CM | POA: Diagnosis not present

## 2015-07-05 DIAGNOSIS — Z95810 Presence of automatic (implantable) cardiac defibrillator: Secondary | ICD-10-CM | POA: Diagnosis not present

## 2015-07-05 DIAGNOSIS — I420 Dilated cardiomyopathy: Secondary | ICD-10-CM | POA: Diagnosis not present

## 2015-07-05 DIAGNOSIS — G4733 Obstructive sleep apnea (adult) (pediatric): Secondary | ICD-10-CM | POA: Diagnosis not present

## 2015-07-05 DIAGNOSIS — I447 Left bundle-branch block, unspecified: Secondary | ICD-10-CM | POA: Diagnosis not present

## 2015-08-23 ENCOUNTER — Other Ambulatory Visit: Payer: Self-pay

## 2015-09-08 ENCOUNTER — Ambulatory Visit (INDEPENDENT_AMBULATORY_CARE_PROVIDER_SITE_OTHER): Payer: Medicare Other | Admitting: Family Medicine

## 2015-09-08 ENCOUNTER — Encounter: Payer: Self-pay | Admitting: Family Medicine

## 2015-09-08 VITALS — BP 122/68 | HR 76 | Ht 62.0 in | Wt 200.0 lb

## 2015-09-08 DIAGNOSIS — M791 Myalgia, unspecified site: Secondary | ICD-10-CM

## 2015-09-08 DIAGNOSIS — S83206D Unspecified tear of unspecified meniscus, current injury, right knee, subsequent encounter: Secondary | ICD-10-CM

## 2015-09-08 MED ORDER — CYCLOBENZAPRINE HCL 5 MG PO TABS: 5.0000 mg | ORAL_TABLET | Freq: Two times a day (BID) | ORAL | Status: DC | PRN

## 2015-09-08 NOTE — Progress Notes (Signed)
Name: Emma Guzman   MRN: [id number]    DOB: 07/28/1945   Date:09/08/2015       Progress Note  Subjective  Chief Complaint  Chief Complaint  Patient presents with  . Knee Pain    R) knee pain    Knee Pain  The incident occurred more than 1 week ago. The pain is present in the left knee and right knee. The quality of the pain is described as aching. The pain is at a severity of 5/10. The pain is moderate. The pain has been fluctuating since onset. Associated symptoms include a loss of motion. Pertinent negatives include no inability to bear weight, loss of sensation, muscle weakness, numbness or tingling. She reports no foreign bodies present. The symptoms are aggravated by movement and weight bearing. She has tried acetaminophen for the symptoms. The treatment provided no relief.    No problem-specific assessment & plan notes found for this encounter.   Past Medical History  Diagnosis Date  . CHF (congestive heart failure) (HCC)   . Hypertension   . Cardiomyopathy (HCC)   . Obesity   . GERD (gastroesophageal reflux disease)   . Incontinence   . Chronic kidney disease     stones  . Sleep apnea     No CPAP since sinus surgery several yrs ago  . Diabetes mellitus without complication (HCC)     diet controlled  . Arthritis     "everywhere"  . Wears dentures     partial upper  . Hard of hearing     Past Surgical History  Procedure Laterality Date  . Pacemaker insertion    . Dental surgery    . Tonsillectomy    . Kidney stone surgery    . Implantable cardioverter defibrillator (icd) generator change N/A 02/15/2015    Procedure: ICD GENERATOR CHANGE;  Surgeon: Marcina MillardAlexander Paraschos, MD;  Location: [facility];  Service: Cardiovascular;  Laterality: N/A;  . Cataract extraction w/phaco Left 03/14/2015    Procedure: CATARACT EXTRACTION PHACO AND INTRAOCULAR LENS PLACEMENT (IOC);  Surgeon: Sherald HessAnita Prakash Vin-Parikh, MD;  [facility];  Service: Ophthalmology;   Laterality: Left;  DIABETIC - diet controlled    No family history on file.  Social History   Social History  . Marital Status: Married    Spouse Name: N/A  . Number of Children: N/A  . Years of Education: N/A   Occupational History  . Not on file.   Social History Main Topics  . Smoking status: Never Smoker   . Smokeless tobacco: Not on file  . Alcohol Use: No  . Drug Use: No  . Sexual Activity: Not on file   Other Topics Concern  . Not on file   Social History Narrative    No Known Allergies   Review of Systems  Constitutional: Negative for fever, chills, weight loss and malaise/fatigue.  HENT: Negative for ear discharge, ear pain and sore throat.   Eyes: Negative for blurred vision.  Respiratory: Negative for cough, sputum production, shortness of breath and wheezing.   Cardiovascular: Negative for chest pain, palpitations and leg swelling.  Gastrointestinal: Negative for heartburn, nausea, abdominal pain, diarrhea, constipation, blood in stool and melena.  Genitourinary: Negative for dysuria, urgency, frequency and hematuria.  Musculoskeletal: Negative for myalgias, back pain, joint pain and neck pain.  Skin: Negative for rash.  Neurological: Negative for dizziness, tingling, sensory change, focal weakness, numbness and headaches.  Endo/Heme/Allergies: Negative for environmental allergies and polydipsia. Does not bruise/bleed easily.  Psychiatric/Behavioral: Negative for depression and suicidal ideas. The patient is not nervous/anxious and does not have insomnia.      Objective  Filed Vitals:   09/08/15 1044  BP: 122/68  Pulse: 76  Height:  (1.575 m)  Weight: 200 lb (90.719 kg)    Physical Exam  Constitutional: She is well-developed, well-nourished, and in no distress. No distress.  HENT:  Head: Normocephalic and atraumatic.  Right Ear: External ear normal.  Left Ear: External ear normal.  Nose: Nose normal.  Mouth/Throat: Oropharynx is clear  and moist.  Eyes: Conjunctivae and EOM are normal. Pupils are equal, round, and reactive to light. Right eye exhibits no discharge. Left eye exhibits no discharge.  Neck: Normal range of motion. Neck supple. No JVD present. No thyromegaly present.  Cardiovascular: Normal rate, regular rhythm, normal heart sounds and intact distal pulses.  Exam reveals no gallop and no friction rub.   No murmur heard. Pulmonary/Chest: Effort normal and breath sounds normal. She has no wheezes. She has no rales.  Abdominal: Soft. Bowel sounds are normal. She exhibits no mass. There is no tenderness. There is no guarding.  Musculoskeletal: Normal range of motion. She exhibits no edema.       Right knee: She exhibits abnormal meniscus. Tenderness found. Medial joint line tenderness noted.  Lymphadenopathy:    She has no cervical adenopathy.  Neurological: She is alert. She has normal reflexes.  Skin: Skin is warm and dry. She is not diaphoretic.  Psychiatric: Mood and affect normal.  Nursing note and vitals reviewed.     Assessment & Plan  Problem List Items Addressed This Visit    None    Visit Diagnoses    Meniscus tear, right, subsequent encounter    -  Primary    Myalgia        Relevant Medications    cyclobenzaprine (FLEXERIL) 5 MG tablet         Dr. [facility]  09/08/2015

## 2016-01-05 DIAGNOSIS — I420 Dilated cardiomyopathy: Secondary | ICD-10-CM | POA: Diagnosis not present

## 2016-01-05 DIAGNOSIS — I447 Left bundle-branch block, unspecified: Secondary | ICD-10-CM | POA: Diagnosis not present

## 2016-01-05 DIAGNOSIS — G4730 Sleep apnea, unspecified: Secondary | ICD-10-CM | POA: Diagnosis not present

## 2016-01-05 DIAGNOSIS — Z95810 Presence of automatic (implantable) cardiac defibrillator: Secondary | ICD-10-CM | POA: Diagnosis not present

## 2016-01-05 DIAGNOSIS — I5023 Acute on chronic systolic (congestive) heart failure: Secondary | ICD-10-CM | POA: Diagnosis not present

## 2016-01-09 ENCOUNTER — Ambulatory Visit
Admission: EM | Admit: 2016-01-09 | Discharge: 2016-01-09 | Disposition: A | Discharge status: Home / Self Care | Payer: Medicare Other | Attending: Family Medicine | Admitting: Family Medicine

## 2016-01-09 ENCOUNTER — Encounter: Payer: Self-pay | Admitting: *Deleted

## 2016-01-09 ENCOUNTER — Ambulatory Visit (INDEPENDENT_AMBULATORY_CARE_PROVIDER_SITE_OTHER): Payer: Medicare Other

## 2016-01-09 DIAGNOSIS — M940 Chondrocostal junction syndrome [Tietze]: Secondary | ICD-10-CM

## 2016-01-09 DIAGNOSIS — R079 Chest pain, unspecified: Secondary | ICD-10-CM | POA: Diagnosis not present

## 2016-01-09 NOTE — ED Triage Notes (Signed)
Friday pt leaning over her truck fender to check oil and felt several "pop" sensations in her right lower anterior rib region. Today again leaning over to reach for an object in her truck and again felt "pop" sensation in the same area. Here c/o right rib pain. Denies dyspnea.

## 2016-01-11 ENCOUNTER — Telehealth: Payer: Self-pay

## 2016-01-25 NOTE — ED Provider Notes (Signed)
[facility]    CSN: [id number] Arrival date & time: 01/09/16  1749     History   Chief Complaint Chief Complaint  Patient presents with  . Rib Injury    HPI Emma Guzman is a 70 y.o. female.   70 yo female with a c/o 3 days of pain to right lower rib area. States she was leaning over her truck fender to check oil and felt several "pop" sensations in her right lower anterior rib region. Today again leaning over to reach for an object in her truck and again felt "pop" sensation in the same area. Denies dyspnea.           Past Medical History:  Diagnosis Date  . Arthritis    "everywhere"  . Cardiomyopathy (HCC)   . CHF (congestive heart failure) (HCC)   . Chronic kidney disease    stones  . Diabetes mellitus without complication (HCC)    diet controlled  . GERD (gastroesophageal reflux disease)   . Hard of hearing   . Hypertension   . Incontinence   . Obesity   . Sleep apnea    No CPAP since sinus surgery several yrs ago  . Wears dentures    partial upper    Patient Active Problem List   Diagnosis Date Noted  . Apnea, sleep 04/07/2014  . Cardiac defibrillator in place 04/07/2014  . Cardiomyopathy (HCC) 04/07/2014  . Acid reflux 04/07/2014  . Morbid obesity (HCC) 05/12/2008  . CARDIOMYOPATHY, PRIMARY, DILATED 05/12/2008  . LBBB 05/12/2008  . SYSTOLIC HEART FAILURE, ACUTE ON CHRONIC 05/12/2008  . AUTOMATIC IMPLANTABLE CARDIAC DEFIBRILLATOR SITU 05/12/2008  . Automatic implantable cardioverter-defibrillator in situ 05/12/2008  . 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 Block, bundle branch, left 05/12/2008    Past Surgical History:  Procedure Laterality Date  . CATARACT EXTRACTION W/PHACO Left 03/14/2015   Procedure: CATARACT EXTRACTION PHACO AND INTRAOCULAR LENS PLACEMENT (IOC);  Surgeon: Sherald Hess, MD;  Location: [facility];  Service: Ophthalmology;  Laterality: Left;  DIABETIC - diet controlled  . DENTAL  SURGERY    . IMPLANTABLE CARDIOVERTER DEFIBRILLATOR (ICD) GENERATOR CHANGE N/A 02/15/2015   Procedure: ICD GENERATOR CHANGE;  Surgeon: Marcina Millard, MD;  Location: [facility];  Service: Cardiovascular;  Laterality: N/A;  . KIDNEY STONE SURGERY    . PACEMAKER INSERTION    . TONSILLECTOMY      OB History    No data available       Home Medications    Prior to Admission medications   Medication Sig Start Date End Date Taking? Authorizing Provider  acetaminophen (TYLENOL) 500 MG tablet Take 500 mg by mouth every 6 (six) hours as needed.   Yes Historical Provider, MD  aspirin 81 MG tablet Take 81 mg by mouth daily.   Yes Historical Provider, MD  carvedilol (COREG) 25 MG tablet Take 25 mg by mouth 2 (two) times daily with a meal. Dr Darrold Junker   Yes Historical Provider, MD  ezetimibe (ZETIA) 10 MG tablet Take 10 mg by mouth at bedtime. Dr Darrold Junker   Yes Historical Provider, MD  lisinopril (PRINIVIL,ZESTRIL) 10 MG tablet Take 10 mg by mouth daily. Dr Darrold Junker   Yes Historical Provider, MD  Multiple Vitamin (MULTIVITAMIN) capsule Take 1 capsule by mouth daily.   Yes Historical Provider, MD  Multiple Vitamins-Minerals (HAIR SKIN AND NAILS FORMULA PO) Take 1 capsule by mouth daily.   Yes Historical Provider, MD  omeprazole (PRILOSEC) 20 MG capsule Take 20 mg  by mouth at bedtime and may repeat dose one time if needed. Dr Darrold JunkerParaschos   Yes Historical Provider, MD  cyclobenzaprine (FLEXERIL) 5 MG tablet Take 1 tablet (5 mg total) by mouth 2 (two) times daily as needed for muscle spasms. 09/08/15   Duanne Limerickeanna C Jones, MD    Family History History reviewed. No pertinent family history.  Social History Social History  Substance Use Topics  . Smoking status: Never Smoker  . Smokeless tobacco: Never Used  . Alcohol use No     Allergies   Review of patient's allergies indicates no known allergies.   Review of Systems Review of Systems   Physical Exam Triage Vital Signs ED Triage Vitals    Enc Vitals Group     BP 01/09/16 1807 115/60     Pulse Rate 01/09/16 1807 69     Resp 01/09/16 1807 16     Temp 01/09/16 1807 99 F (37.2 C)     Temp Source 01/09/16 1807 Oral     SpO2 01/09/16 1807 95 %     Weight 01/09/16 1809 201 lb (91.2 kg)     Height 01/09/16 1809 5\' 2"  (1.575 m)     Head Circumference --      Peak Flow --      Pain Score 01/09/16 1814 6     Pain Loc --      Pain Edu? --      Excl. in GC? --    No data found.   Updated Vital Signs BP 115/60 (BP Location: Left Arm)   Pulse 69   Temp 99 F (37.2 C) (Oral)   Resp 16   Ht 5\' 2"  (1.575 m)   Wt 201 lb (91.2 kg)   SpO2 95%   BMI 36.76 kg/m   Visual Acuity Right Eye Distance:   Left Eye Distance:   Bilateral Distance:    Right Eye Near:   Left Eye Near:    Bilateral Near:     Physical Exam  Constitutional: She appears well-developed and well-nourished. No distress.  Cardiovascular: Normal rate, regular rhythm, normal heart sounds and intact distal pulses.   Pulmonary/Chest: Effort normal and breath sounds normal. No respiratory distress. She has no wheezes. She has no rales. She exhibits tenderness (right lower rib tenderness; no step-off).  Skin: No rash noted. She is not diaphoretic.  Vitals reviewed.    UC Treatments / Results  Labs (all labs ordered are listed, but only abnormal results are displayed) Labs Reviewed - No data to display  EKG  EKG Interpretation None       Radiology No results found.  Procedures Procedures (including critical care time)  Medications Ordered in UC Medications - No data to display   Initial Impression / Assessment and Plan / UC Course  I have reviewed the triage vital signs and the nursing notes.  Pertinent labs & imaging results that were available during my care of the patient were reviewed by me and considered in my medical decision making (see chart for details).  Clinical Course      Final Clinical Impressions(s) / UC Diagnoses    Final diagnoses:  Costochondritis    New Prescriptions Discharge Medication List as of 01/09/2016  8:44 PM     1. x-ray results (negative) and diagnosis reviewed with patient 2. Recommend supportive treatment with ice, rest, otc analgesics prn 3. Follow-up prn if symptoms worsen or don't improve   Payton Mccallumrlando , MD 01/25/16 [phone]

## 2016-07-10 DIAGNOSIS — I5023 Acute on chronic systolic (congestive) heart failure: Secondary | ICD-10-CM | POA: Diagnosis not present

## 2016-08-02 DIAGNOSIS — I420 Dilated cardiomyopathy: Secondary | ICD-10-CM | POA: Diagnosis not present

## 2016-08-02 DIAGNOSIS — I447 Left bundle-branch block, unspecified: Secondary | ICD-10-CM | POA: Diagnosis not present

## 2016-08-02 DIAGNOSIS — Z95810 Presence of automatic (implantable) cardiac defibrillator: Secondary | ICD-10-CM | POA: Diagnosis not present

## 2016-08-02 DIAGNOSIS — I5022 Chronic systolic (congestive) heart failure: Secondary | ICD-10-CM | POA: Diagnosis not present

## 2016-08-02 DIAGNOSIS — I423 Endomyocardial (eosinophilic) disease: Secondary | ICD-10-CM | POA: Diagnosis not present

## 2016-08-23 DIAGNOSIS — I429 Cardiomyopathy, unspecified: Secondary | ICD-10-CM | POA: Diagnosis not present

## 2016-08-23 DIAGNOSIS — I509 Heart failure, unspecified: Secondary | ICD-10-CM | POA: Diagnosis not present

## 2016-08-23 DIAGNOSIS — I420 Dilated cardiomyopathy: Secondary | ICD-10-CM | POA: Diagnosis not present

## 2017-02-12 DIAGNOSIS — I10 Essential (primary) hypertension: Secondary | ICD-10-CM | POA: Diagnosis not present

## 2017-02-12 DIAGNOSIS — Z95810 Presence of automatic (implantable) cardiac defibrillator: Secondary | ICD-10-CM | POA: Diagnosis not present

## 2017-02-12 DIAGNOSIS — E785 Hyperlipidemia, unspecified: Secondary | ICD-10-CM | POA: Diagnosis not present

## 2017-02-12 DIAGNOSIS — I5022 Chronic systolic (congestive) heart failure: Secondary | ICD-10-CM | POA: Diagnosis not present

## 2017-02-26 DIAGNOSIS — I5022 Chronic systolic (congestive) heart failure: Secondary | ICD-10-CM | POA: Diagnosis not present

## 2017-03-19 ENCOUNTER — Ambulatory Visit (INDEPENDENT_AMBULATORY_CARE_PROVIDER_SITE_OTHER): Payer: Medicare Other

## 2017-03-19 ENCOUNTER — Other Ambulatory Visit: Payer: Self-pay

## 2017-03-19 ENCOUNTER — Encounter: Payer: Self-pay | Admitting: Emergency Medicine

## 2017-03-19 ENCOUNTER — Emergency Department: Payer: Medicare Other

## 2017-03-19 ENCOUNTER — Emergency Department
Admission: EM | Admit: 2017-03-19 | Discharge: 2017-03-19 | Disposition: A | Discharge status: Home / Self Care | Payer: Medicare Other | Attending: Emergency Medicine | Admitting: Emergency Medicine

## 2017-03-19 ENCOUNTER — Ambulatory Visit
Admission: EM | Admit: 2017-03-19 | Discharge: 2017-03-19 | Disposition: A | Discharge status: Other Acute Inpatient Hospital | Payer: Medicare Other | Source: Home / Self Care | Attending: Family Medicine | Admitting: Family Medicine

## 2017-03-19 DIAGNOSIS — M25532 Pain in left wrist: Secondary | ICD-10-CM

## 2017-03-19 DIAGNOSIS — I110 Hypertensive heart disease with heart failure: Secondary | ICD-10-CM | POA: Insufficient documentation

## 2017-03-19 DIAGNOSIS — S0083XA Contusion of other part of head, initial encounter: Secondary | ICD-10-CM | POA: Diagnosis not present

## 2017-03-19 DIAGNOSIS — S60222A Contusion of left hand, initial encounter: Secondary | ICD-10-CM | POA: Diagnosis not present

## 2017-03-19 DIAGNOSIS — I5023 Acute on chronic systolic (congestive) heart failure: Secondary | ICD-10-CM | POA: Diagnosis not present

## 2017-03-19 DIAGNOSIS — M79641 Pain in right hand: Secondary | ICD-10-CM | POA: Diagnosis not present

## 2017-03-19 DIAGNOSIS — S0990XA Unspecified injury of head, initial encounter: Secondary | ICD-10-CM | POA: Diagnosis not present

## 2017-03-19 DIAGNOSIS — W19XXXA Unspecified fall, initial encounter: Secondary | ICD-10-CM

## 2017-03-19 DIAGNOSIS — S0993XA Unspecified injury of face, initial encounter: Secondary | ICD-10-CM | POA: Diagnosis not present

## 2017-03-19 DIAGNOSIS — Y999 Unspecified external cause status: Secondary | ICD-10-CM | POA: Insufficient documentation

## 2017-03-19 DIAGNOSIS — Y929 Unspecified place or not applicable: Secondary | ICD-10-CM | POA: Diagnosis not present

## 2017-03-19 DIAGNOSIS — M79642 Pain in left hand: Secondary | ICD-10-CM

## 2017-03-19 DIAGNOSIS — S6991XA Unspecified injury of right wrist, hand and finger(s), initial encounter: Secondary | ICD-10-CM | POA: Diagnosis not present

## 2017-03-19 DIAGNOSIS — S52571A Other intraarticular fracture of lower end of right radius, initial encounter for closed fracture: Secondary | ICD-10-CM

## 2017-03-19 DIAGNOSIS — Z95810 Presence of automatic (implantable) cardiac defibrillator: Secondary | ICD-10-CM | POA: Diagnosis not present

## 2017-03-19 DIAGNOSIS — S6992XA Unspecified injury of left wrist, hand and finger(s), initial encounter: Secondary | ICD-10-CM | POA: Diagnosis not present

## 2017-03-19 DIAGNOSIS — R221 Localized swelling, mass and lump, neck: Secondary | ICD-10-CM | POA: Diagnosis not present

## 2017-03-19 DIAGNOSIS — Z79899 Other long term (current) drug therapy: Secondary | ICD-10-CM | POA: Insufficient documentation

## 2017-03-19 DIAGNOSIS — Y939 Activity, unspecified: Secondary | ICD-10-CM | POA: Insufficient documentation

## 2017-03-19 DIAGNOSIS — Z23 Encounter for immunization: Secondary | ICD-10-CM | POA: Insufficient documentation

## 2017-03-19 DIAGNOSIS — Z7982 Long term (current) use of aspirin: Secondary | ICD-10-CM | POA: Diagnosis not present

## 2017-03-19 DIAGNOSIS — E119 Type 2 diabetes mellitus without complications: Secondary | ICD-10-CM | POA: Insufficient documentation

## 2017-03-19 DIAGNOSIS — R220 Localized swelling, mass and lump, head: Secondary | ICD-10-CM | POA: Diagnosis not present

## 2017-03-19 DIAGNOSIS — M25531 Pain in right wrist: Secondary | ICD-10-CM | POA: Diagnosis not present

## 2017-03-19 DIAGNOSIS — S52501A Unspecified fracture of the lower end of right radius, initial encounter for closed fracture: Secondary | ICD-10-CM

## 2017-03-19 DIAGNOSIS — G501 Atypical facial pain: Secondary | ICD-10-CM | POA: Diagnosis not present

## 2017-03-19 DIAGNOSIS — W01198A Fall on same level from slipping, tripping and stumbling with subsequent striking against other object, initial encounter: Secondary | ICD-10-CM | POA: Diagnosis not present

## 2017-03-19 HISTORY — DX: Pure hypercholesterolemia, unspecified: E78.00

## 2017-03-19 MED ORDER — HYDROCODONE-ACETAMINOPHEN 5-325 MG PO TABS: ORAL_TABLET | ORAL | 0 refills | Status: DC

## 2017-03-19 MED ORDER — ACETAMINOPHEN 325 MG PO TABS
650.0000 mg | ORAL_TABLET | Freq: Once | ORAL | Status: AC
  Administered 2017-03-19: 650 mg via ORAL
  Filled 2017-03-19: qty 2

## 2017-03-19 MED ORDER — TETANUS-DIPHTH-ACELL PERTUSSIS 5-2.5-18.5 LF-MCG/0.5 IM SUSP
0.5000 mL | Freq: Once | INTRAMUSCULAR | Status: AC
  Administered 2017-03-19: 0.5 mL via INTRAMUSCULAR
  Filled 2017-03-19: qty 0.5

## 2017-03-19 NOTE — ED Notes (Signed)
Pt has right sided facial swelling. Right arm is in a sling. Per patient, ptt was seen and [facility]; pt sent to the ED to have a CT scan of the head.

## 2017-03-19 NOTE — Discharge Instructions (Signed)
Recommend patient go to Emergency Department for further evaluation of facial injury (possible facial CT) Follow up with orthopedist regarding wrist fracture

## 2017-03-19 NOTE — ED Triage Notes (Signed)
Patient states that she was at a barber shop with her husband and went to move her truck. Patient states that she tripped over a concrete barrier and landed on her face on the concrete. Patient states that she tried to catch herself and is having extreme pain in her right wrist as well at the left. Patient states that she has noticed a scrape on her knee. Patient states that this occurred around 30 minutes prior.

## 2017-03-19 NOTE — ED Provider Notes (Signed)
[facility] Emergency Department Provider Note  ____________________________________________  Time seen: Approximately 10:07 PM  I have reviewed the triage vital signs and the nursing notes.   HISTORY  Chief Complaint Fall and Facial Injury    HPI Emma Guzman is a 71 y.o. female presents to the emergency department with right-sided facial pain since falling earlier today.  Patient also reports that she experienced right wrist pain after falling on her right outstretched hand.  Patient was seen earlier today by urgent care and was diagnosed with a nondisplaced right distal radius fracture.  Patient was referred to the ED by urgent care for a CT of her head and face.  Patient reports no loss of consciousness.  She denies blurry vision, nausea, vomiting or disorientation.  No prior history of traumatic brain injury.  Patient's tetanus status is out of date.  Patient has received a prescription for Vicodin.  Vital signs are reassuring prior to discharge.  All patient questions were answered.  Past Medical History:  Diagnosis Date  . Arthritis    "everywhere"  . Cardiomyopathy (HCC)   . CHF (congestive heart failure) (HCC)   . Chronic kidney disease    stones  . Diabetes mellitus without complication (HCC)    diet controlled  . GERD (gastroesophageal reflux disease)   . Hard of hearing   . Hypercholesteremia   . Hypertension   . Incontinence   . Obesity   . Sleep apnea    No CPAP since sinus surgery several yrs ago  . Wears dentures    partial upper    Patient Active Problem List   Diagnosis Date Noted  . Apnea, sleep 04/07/2014  . Cardiac defibrillator in place 04/07/2014  . Cardiomyopathy (HCC) 04/07/2014  . Acid reflux 04/07/2014  . Morbid obesity (HCC) 05/12/2008  . CARDIOMYOPATHY, PRIMARY, DILATED 05/12/2008  . LBBB 05/12/2008  . SYSTOLIC HEART FAILURE, ACUTE ON CHRONIC 05/12/2008  . AUTOMATIC IMPLANTABLE CARDIAC DEFIBRILLATOR SITU  05/12/2008  . Automatic implantable cardioverter-defibrillator in situ 05/12/2008  . 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 Block, bundle branch, left 05/12/2008    Past Surgical History:  Procedure Laterality Date  . CATARACT EXTRACTION W/PHACO Left 03/14/2015   Procedure: CATARACT EXTRACTION PHACO AND INTRAOCULAR LENS PLACEMENT (IOC);  Surgeon: Sherald Hess, MD;  Location: [facility];  Service: Ophthalmology;  Laterality: Left;  DIABETIC - diet controlled  . DENTAL SURGERY    . IMPLANTABLE CARDIOVERTER DEFIBRILLATOR (ICD) GENERATOR CHANGE N/A 02/15/2015   Procedure: ICD GENERATOR CHANGE;  Surgeon: Marcina Millard, MD;  Location: [facility];  Service: Cardiovascular;  Laterality: N/A;  . KIDNEY STONE SURGERY    . PACEMAKER INSERTION    . TONSILLECTOMY      Prior to Admission medications   Medication Sig Start Date End Date Taking? Authorizing Provider  acetaminophen (TYLENOL) 500 MG tablet Take 500 mg by mouth every 6 (six) hours as needed.    [provider]  aspirin 81 MG tablet Take 81 mg by mouth daily.    [provider]  carvedilol (COREG) 25 MG tablet Take 25 mg by mouth 2 (two) times daily with a meal. Dr Darrold Junker    [provider]  cyclobenzaprine (FLEXERIL) 5 MG tablet Take 1 tablet (5 mg total) by mouth 2 (two) times daily as needed for muscle spasms. 09/08/15   Duanne Limerick, MD  ezetimibe (ZETIA) 10 MG tablet Take 10 mg by mouth at bedtime. Dr Darrold Junker    [provider]  HYDROcodone-acetaminophen (NORCO/VICODIN) 5-325 MG tablet 1-2 tabs po q 8 hours prn 03/19/17   Payton Mccallumonty, [place], MD  lisinopril (PRINIVIL,ZESTRIL) 10 MG tablet Take 10 mg by mouth daily. Dr Darrold JunkerParaschos    [provider]  Multiple Vitamin (MULTIVITAMIN) capsule Take 1 capsule by mouth daily.    [provider]  Multiple Vitamins-Minerals (HAIR SKIN AND NAILS FORMULA PO) Take 1 capsule by mouth daily.    [provider]   omeprazole (PRILOSEC) 20 MG capsule Take 20 mg by mouth at bedtime and may repeat dose one time if needed. Dr Darrold JunkerParaschos    [provider]    Allergies Patient has no known allergies.  No family history on file.  Social History Social History   Tobacco Use  . Smoking status: Never Smoker  . Smokeless tobacco: Never Used  Substance Use Topics  . Alcohol use: No  . Drug use: No     Review of Systems  Constitutional: No fever/chills Eyes: No visual changes. No discharge ENT: No upper respiratory complaints. Cardiovascular: no chest pain. Respiratory: no cough. No SOB. Musculoskeletal: Negative for musculoskeletal pain. Skin: Patient has right sided facial edema and ecchymosis. Neurological: Negative for headaches, focal weakness or numbness.   ____________________________________________   PHYSICAL EXAM:  VITAL SIGNS: ED Triage Vitals  Enc Vitals Group     BP 03/19/17 1950 (!) 129/116     Pulse Rate 03/19/17 1950 81     Resp 03/19/17 1950 20     Temp 03/19/17 1950 98.4 F (36.9 C)     Temp Source 03/19/17 1950 Oral     SpO2 03/19/17 1950 100 %     Weight 03/19/17 1950 205 lb (93 kg)     Height 03/19/17 1950 5\' 2"  (1.575 m)     Head Circumference --      Peak Flow --      Pain Score 03/19/17 1949 7     Pain Loc --      Pain Edu? --      Excl. in GC? --      Constitutional: Alert and oriented. Well appearing and in no acute distress. Eyes: Conjunctivae are normal. PERRL. EOMI. Head: Patient has right-sided facial edema and ecchymosis. ENT:      Ears: TMs are pearly bilaterally without evidence of bloody effusion.      Nose: No congestion/rhinnorhea.      Mouth/Throat: Mucous membranes are moist.  Neck: Full range of motion with no tenderness elicited with palpation along the cervical spine. Cardiovascular: Normal rate, regular rhythm. Normal S1 and S2.  Good peripheral circulation. Respiratory: Normal respiratory effort without tachypnea or  retractions. Lungs CTAB. Good air entry to the bases with no decreased or absent breath sounds. Musculoskeletal: Patient is unable to demonstrate full range of motion at the wrist, likely secondary to pain.  She is able to move all 5 right fingers.  Palpable radial pulse, right.. Neurologic:  Normal speech and language. No gross focal neurologic deficits are appreciated.  Psychiatric: Mood and affect are normal. Speech and behavior are normal. Patient exhibits appropriate insight and judgement.   ____________________________________________   LABS (all labs ordered are listed, but only abnormal results are displayed)  Labs Reviewed - No data to display ____________________________________________  EKG   ____________________________________________  RADIOLOGY Geraldo PitterI, Jaclyn M Woods, personally viewed and evaluated these images (plain radiographs) as part of my medical decision making, as well as reviewing the written report by the radiologist.  Dg Wrist Complete  Left  Result Date: 03/19/2017 CLINICAL DATA:  Fall with wrist pain EXAM: LEFT WRIST - COMPLETE 3+ VIEW COMPARISON:  None. FINDINGS: Moderate to marked arthritis at the first [facility] joint. Calcifications at the triangular fibrocartilage. No definite acute displaced fracture or malalignment is seen. IMPRESSION: 1. No definite acute osseous abnormality 2. Chondrocalcinosis 3. Advanced arthritis at the first [facility] Electronically Signed   By: Jasmine PangKim  Fujinaga M.D.   On: 03/19/2017 17:56   Dg Wrist Complete Right  Result Date: 03/19/2017 CLINICAL DATA:  Fall with right wrist pain.  Initial encounter. EXAM: RIGHT WRIST - COMPLETE 3+ VIEW COMPARISON:  None. FINDINGS: Subtle but convincing fracture lucency through the radial styloid, nondisplaced. On the lateral view there is also a posterior lip fracture. Normal radiocarpal alignment. Osteopenia. First CMC osteoarthritis. Suspect previous partial trapezium resection. IMPRESSION: Nondisplaced  distal radius fracture involving the dorsal lip and styloid. Electronically Signed   By: Marnee SpringJonathon  Watts M.D.   On: 03/19/2017 17:57   Ct Head Wo Contrast  Result Date: 03/19/2017 CLINICAL DATA:  Pt states she was walking and tripped over a piece of raised concrete and hit the right side of her face on the raised concrete parking space divider. Has right sided facial bruising and swelling. Denies LOC EXAM: CT HEAD WITHOUT CONTRAST CT MAXILLOFACIAL WITHOUT CONTRAST TECHNIQUE: Multidetector CT imaging of the head and maxillofacial structures were performed using the standard protocol without intravenous contrast. Multiplanar CT image reconstructions of the maxillofacial structures were also generated. COMPARISON:  11/08/2006 FINDINGS: CT HEAD FINDINGS Brain: No evidence of acute infarction, hemorrhage, hydrocephalus, extra-axial collection or mass lesion/mass effect. There is mild ventricular sulcal enlargement reflecting age-appropriate volume loss. Patchy areas of white matter hypoattenuation are noted consistent with mild chronic microvascular ischemic change. Vascular: No hyperdense vessel or unexpected calcification. Skull: Normal. Negative for fracture or focal lesion. Other: None. CT MAXILLOFACIAL FINDINGS Osseous: No fracture. There are changes from sinus surgery on the right with a medial right antrectomy, medial turbinectomy, uncinatectomy and partial ethmoidectomies. Periapical lucency is seen involving several residual teeth most evident involving the right maxillary molar. Orbits: There is mild preseptal right infraorbital swelling/ hemorrhage that extends to the right cheek. Right globe and postseptal orbit are unremarkable. Unremarkable left globe and orbit. Sinuses: Minor dependent secretions in the left sphenoid sinus and minor mucosal thickening along the inferior maxillary sinuses. Remaining sinuses clear. Clear mastoid air cells and middle ear cavities. Soft tissues: Soft tissue  contusion/hemorrhage along the right cheek. No soft tissue masses. No adenopathy. IMPRESSION: HEAD CT 1. No acute intracranial abnormalities. 2. Age related volume loss and mild chronic microvascular ischemic change. MAXILLOFACIAL CT 1. No fracture. 2. Right preseptal infraorbital and right cheek soft tissue swelling/hemorrhage. Electronically Signed   By: Amie Portlandavid  Ormond M.D.   On: 03/19/2017 21:05   Dg Hand Complete Left  Result Date: 03/19/2017 CLINICAL DATA:  Left hand pain EXAM: LEFT HAND - COMPLETE 3+ VIEW COMPARISON:  None. FINDINGS: Joint space narrowing at the DIP joints. No fracture or malalignment is seen. Advanced arthritis at the first [facility] joint. Calcifications at the triangular fibrocartilage. Mild erosion at the head of the second middle phalanx. IMPRESSION: 1. No acute osseous abnormality 2. Arthritis at the DIP joints with mild erosive change at the second PIP joint. Advanced arthritis at the first [facility] joint 3. 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   On: 03/19/2017 17:59   Dg Hand Complete Right  Result Date: 03/19/2017  CLINICAL DATA:  Fall with pain EXAM: RIGHT HAND - COMPLETE 3+ VIEW COMPARISON:  None. FINDINGS: Narrowing at the DIP joints. Marked arthritis at the first [facility] joint. No fracture or dislocation in the digits. Probable acute intra-articular distal radius fracture. IMPRESSION: 1. No acute osseous abnormality within the digits of the right hand. Arthritis at the DIP joints and first CMC joint. 2. Probable intra-articular distal radius fracture Electronically Signed   By: Jasmine Pang M.D.   On: 03/19/2017 17:57   Ct Maxillofacial Wo Contrast  Result Date: 03/19/2017 CLINICAL DATA:  Pt states she was walking and tripped over a piece of raised concrete and hit the right side of her face on the raised concrete parking space divider. Has right sided facial bruising and swelling. Denies LOC EXAM: CT HEAD WITHOUT CONTRAST CT MAXILLOFACIAL  WITHOUT CONTRAST TECHNIQUE: Multidetector CT imaging of the head and maxillofacial structures were performed using the standard protocol without intravenous contrast. Multiplanar CT image reconstructions of the maxillofacial structures were also generated. COMPARISON:  11/08/2006 FINDINGS: CT HEAD FINDINGS Brain: No evidence of acute infarction, hemorrhage, hydrocephalus, extra-axial collection or mass lesion/mass effect. There is mild ventricular sulcal enlargement reflecting age-appropriate volume loss. Patchy areas of white matter hypoattenuation are noted consistent with mild chronic microvascular ischemic change. Vascular: No hyperdense vessel or unexpected calcification. Skull: Normal. Negative for fracture or focal lesion. Other: None. CT MAXILLOFACIAL FINDINGS Osseous: No fracture. There are changes from sinus surgery on the right with a medial right antrectomy, medial turbinectomy, uncinatectomy and partial ethmoidectomies. Periapical lucency is seen involving several residual teeth most evident involving the right maxillary molar. Orbits: There is mild preseptal right infraorbital swelling/ hemorrhage that extends to the right cheek. Right globe and postseptal orbit are unremarkable. Unremarkable left globe and orbit. Sinuses: Minor dependent secretions in the left sphenoid sinus and minor mucosal thickening along the inferior maxillary sinuses. Remaining sinuses clear. Clear mastoid air cells and middle ear cavities. Soft tissues: Soft tissue contusion/hemorrhage along the right cheek. No soft tissue masses. No adenopathy. IMPRESSION: HEAD CT 1. No acute intracranial abnormalities. 2. Age related volume loss and mild chronic microvascular ischemic change. MAXILLOFACIAL CT 1. No fracture. 2. Right preseptal infraorbital and right cheek soft tissue swelling/hemorrhage. Electronically Signed   By: Amie Portland M.D.   On: 03/19/2017 21:05     ____________________________________________    PROCEDURES  Procedure(s) performed:    Procedures    Medications  acetaminophen (TYLENOL) tablet 650 mg (650 mg Oral Given 03/19/17 2047)  Tdap (BOOSTRIX) injection 0.5 mL (0.5 mLs Intramuscular Given 03/19/17 2140)     ____________________________________________   INITIAL IMPRESSION / ASSESSMENT AND PLAN / ED COURSE  Pertinent labs & imaging results that were available during my care of the patient were reviewed by me and considered in my medical decision making (see chart for details).  Review of the [place] CSRS was performed in accordance of the [place] prior to dispensing any controlled drugs.     Assessment and plan Facial contusion Patient presents to the emergency department after falling today after tripping on concrete.  As patient had significant right-sided facial edema, CT head and CT maxillofacial were warranted.  Soft tissue swelling was identified on CT examination but no acute fractures or evidence of intracranial hemorrhage.  Neurologic exam is reassuring.  Patient had been prescribed Vicodin by urgent care.  Patient was given Tylenol in the emergency department.  Strict return precautions were given to return for new or worsening symptoms and patient voiced  understanding regarding this recommendation.  All patient questions were answered.    ____________________________________________  FINAL CLINICAL IMPRESSION(S) / ED DIAGNOSES  Final diagnoses:  Fall, initial encounter  Contusion of face, initial encounter      NEW MEDICATIONS STARTED DURING THIS VISIT:  ED Discharge Orders    None          This chart was dictated using voice recognition software/Dragon. Despite best efforts to proofread, errors can occur which can change the meaning. Any change was purely unintentional.    Orvil Feil, PA-C 03/19/17 2221    Minna Antis, MD 03/19/17 2329

## 2017-03-19 NOTE — ED Triage Notes (Signed)
Pt presents to ED with bruising and swelling to the right side of her face and pain to her right wrist after fall earlier this afteroon. Pt states she was walking and tripped over a piece of raised concrete and hit the right side of her face on the raised concrete parking space divider and landed on her right hand. [facility] and was told her wrist was broken but was encouraged to come to this ED for a CT of her face. Splint placed on pt right arm. Pt speaking clearly and able to answer all questions without difficulty.

## 2017-03-20 ENCOUNTER — Other Ambulatory Visit: Payer: Self-pay

## 2017-03-20 DIAGNOSIS — W1831XA Fall on same level due to stepping on an object, initial encounter: Secondary | ICD-10-CM | POA: Diagnosis not present

## 2017-03-20 DIAGNOSIS — S52501A Unspecified fracture of the lower end of right radius, initial encounter for closed fracture: Secondary | ICD-10-CM | POA: Diagnosis not present

## 2017-03-22 ENCOUNTER — Ambulatory Visit: Payer: Medicare Other | Admitting: Family Medicine

## 2017-04-04 DIAGNOSIS — S52591D Other fractures of lower end of right radius, subsequent encounter for closed fracture with routine healing: Secondary | ICD-10-CM | POA: Diagnosis not present

## 2017-04-18 DIAGNOSIS — S52591D Other fractures of lower end of right radius, subsequent encounter for closed fracture with routine healing: Secondary | ICD-10-CM | POA: Diagnosis not present

## 2017-04-18 DIAGNOSIS — S52531D Colles' fracture of right radius, subsequent encounter for closed fracture with routine healing: Secondary | ICD-10-CM | POA: Diagnosis not present

## 2017-05-02 DIAGNOSIS — S52531D Colles' fracture of right radius, subsequent encounter for closed fracture with routine healing: Secondary | ICD-10-CM | POA: Diagnosis not present

## 2017-07-05 ENCOUNTER — Other Ambulatory Visit: Payer: Self-pay

## 2017-08-18 IMAGING — CR DG RIBS W/ CHEST 3+V*R*
5 series · 6 of 6 positions shown · non-contrast
Comparison: PA and lateral chest 02/11/2015.

CLINICAL DATA: The patient heard 3 pops in the right lower chest
with onset of pain when leaning over 4 days ago. Initial encounter.

EXAM:
RIGHT RIBS AND CHEST - 3+ VIEW

[chest pa]
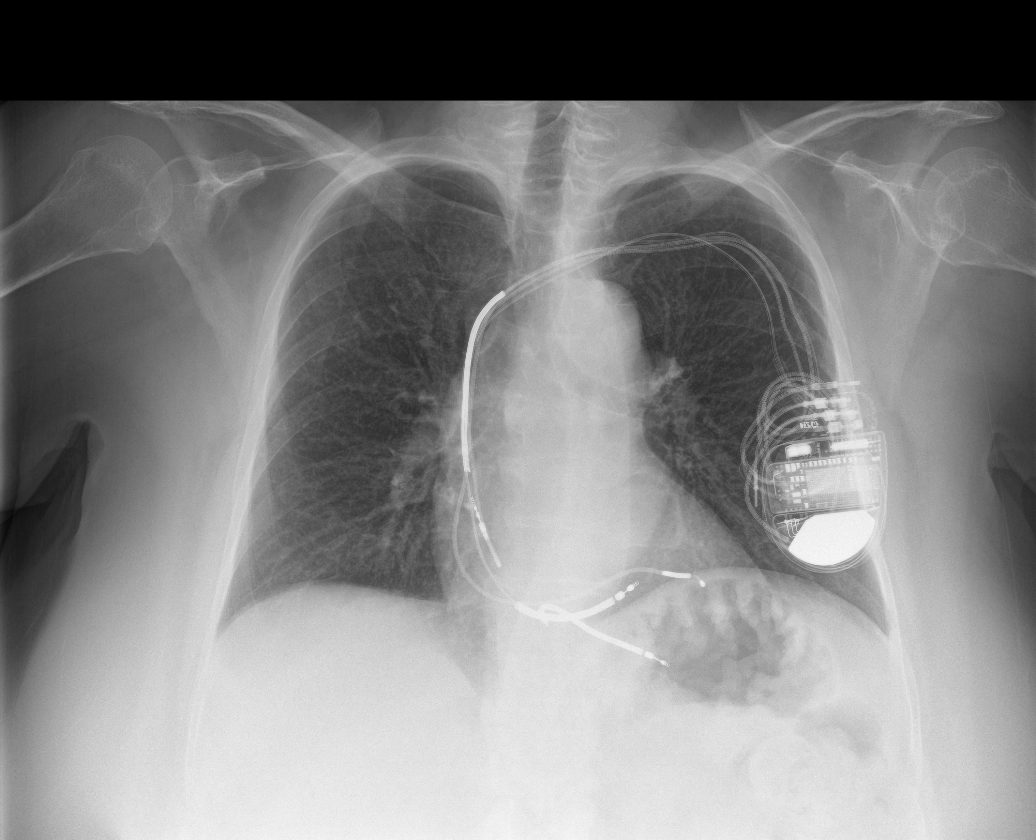

[rib pa (1 of 2)]
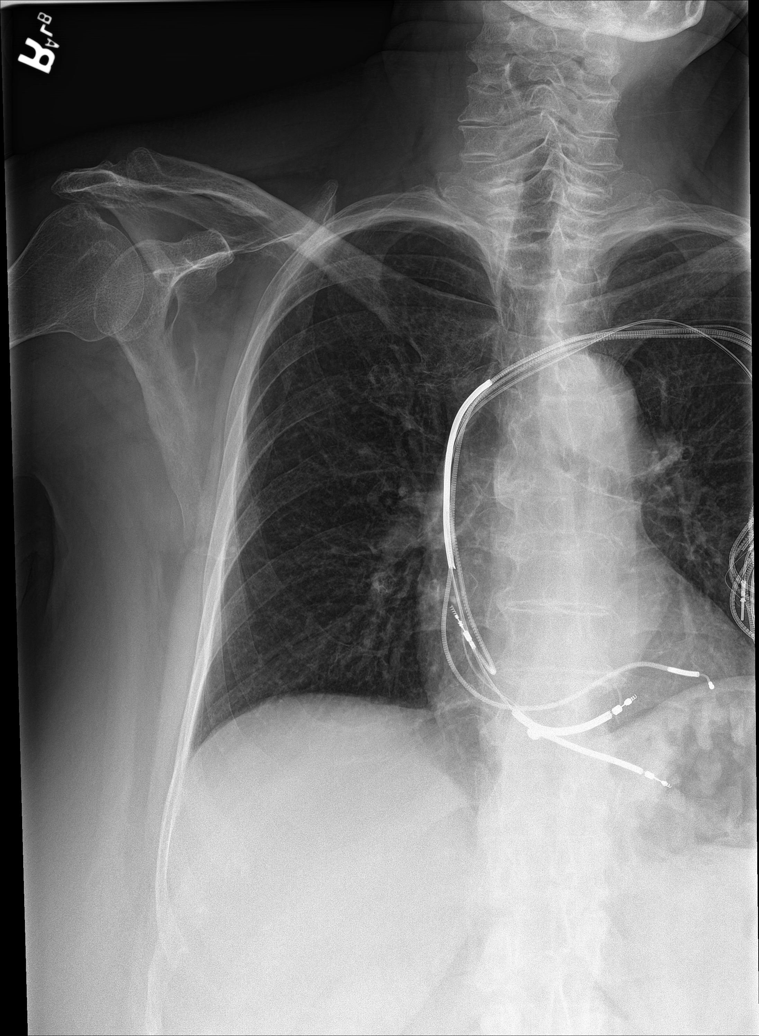

[rib pa (2 of 2)]
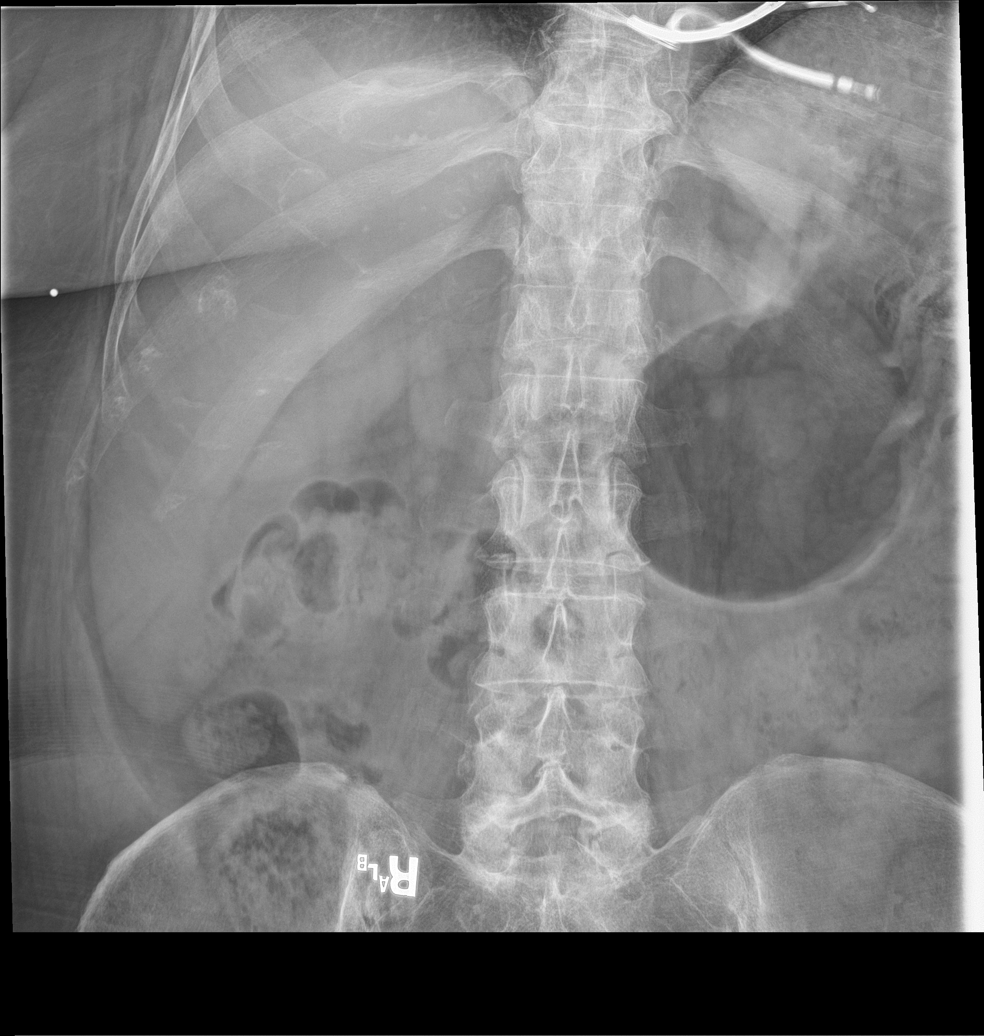

[rib obl (1 of 2)]
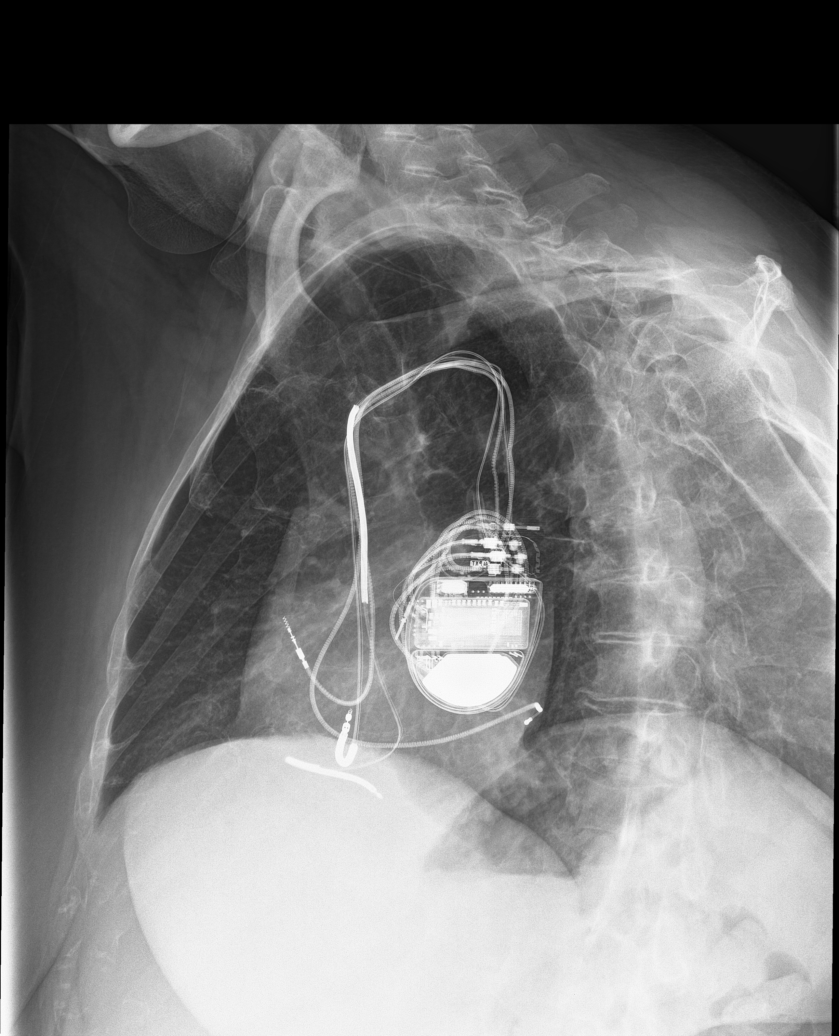

[Series 5: rib obl · 0.14mm/px · 2 of 2 slices shown (2 of 2)]
[im 1/2]
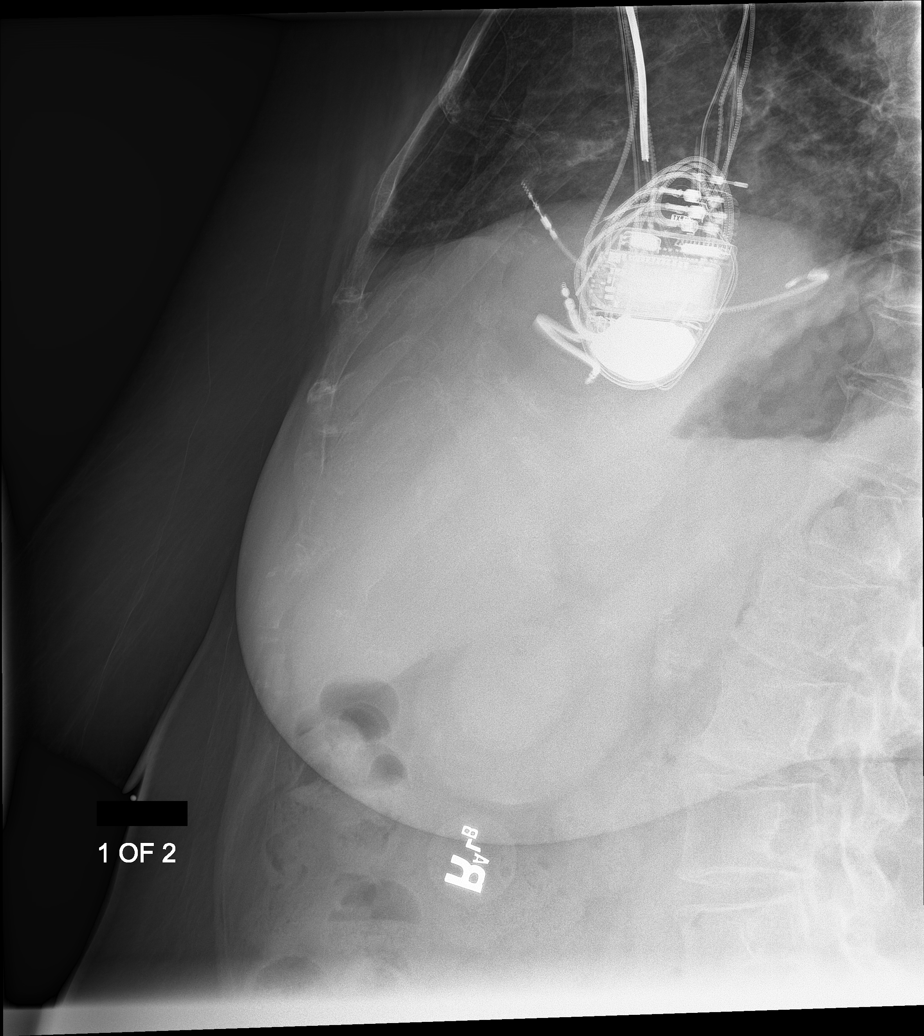
[im 2/2]
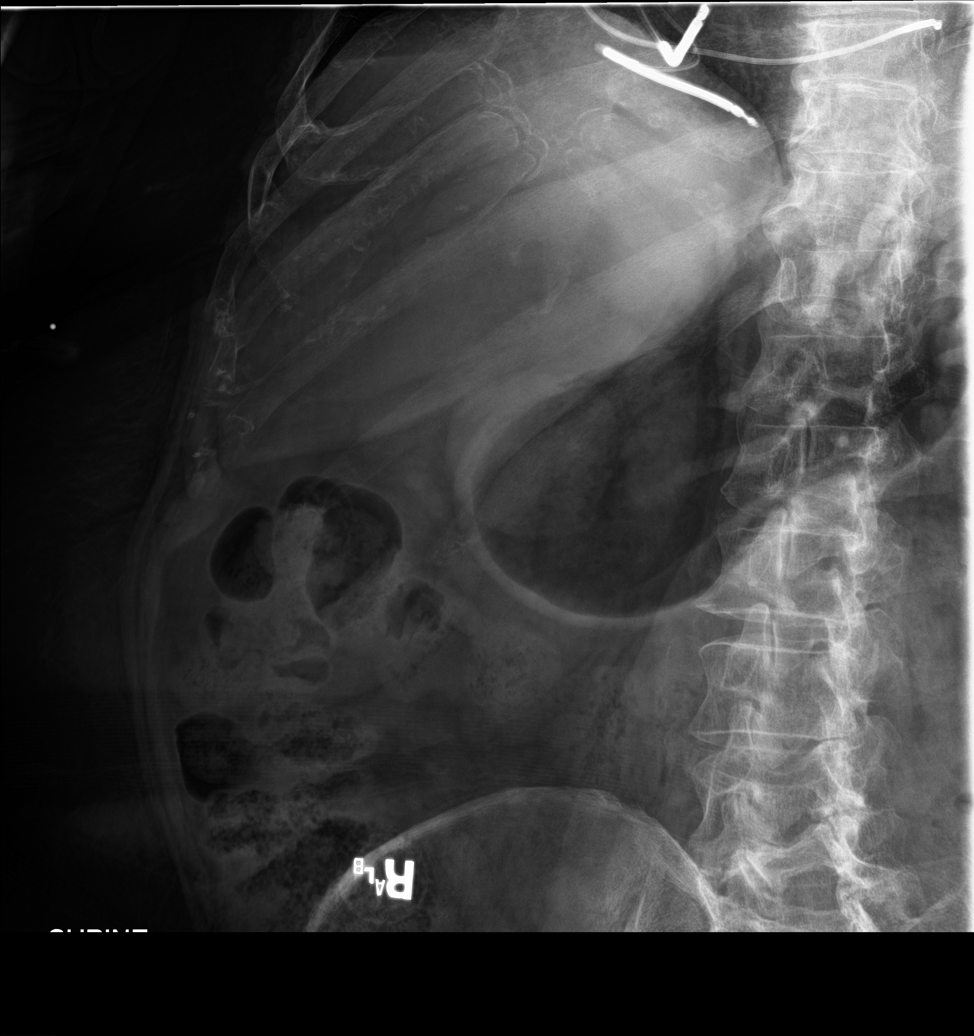

[6 of 6 positions shown; findings below may reference images not displayed]

FINDINGS: Single view of the chest demonstrates clear lungs and normal heart
size. No pneumothorax or pleural effusion. AICD is noted. No
fracture is identified.
IMPRESSION: No acute disease.  Negative for fracture.

## 2018-06-16 ENCOUNTER — Telehealth: Payer: Self-pay | Admitting: Family Medicine

## 2018-06-16 NOTE — Telephone Encounter (Signed)
Called to schedule Medicare Annual Wellness Visit with the Nurse Health Advisor.  Patient states she is not seeing Dr. Yetta Barre anymore at [facility].  Patient states she is now seeing Dr. Angus Palms with [facility] in [place].  Trixie Rude, Care Guide.

## 2018-11-20 ENCOUNTER — Ambulatory Visit
Admission: EM | Admit: 2018-11-20 | Discharge: 2018-11-20 | Disposition: A | Discharge status: Home / Self Care | Payer: Medicare Other

## 2018-11-20 ENCOUNTER — Other Ambulatory Visit: Payer: Self-pay

## 2018-11-20 DIAGNOSIS — K047 Periapical abscess without sinus: Secondary | ICD-10-CM | POA: Diagnosis not present

## 2018-11-20 MED ORDER — AMOXICILLIN 500 MG PO CAPS: 500.0000 mg | ORAL_CAPSULE | Freq: Two times a day (BID) | ORAL | 0 refills | Status: DC

## 2018-11-20 NOTE — ED Provider Notes (Signed)
[place], [place]   Name: Emma SplinterMargaret P Cravey DOB: 1945-09-03 MRN: [id number] CSN: [id number] PCP: Duanne LimerickJones, Deanna C, MD  Arrival date and time:  11/20/18 1144  Chief Complaint:  Dental Pain   NOTE: Prior to seeing the patient today, I have reviewed the triage nursing documentation and vital signs. Clinical staff has updated patient's PMH/PSHx, current medication list, and drug allergies/intolerances to ensure comprehensive history available to assist in medical decision making.   History:   HPI: Emma Guzman is a 73 y.o. female who presents today with complaints of tenderness and swelling to her RIGHT lower jaw that began 3 days ago. Patient with history of previous dental abscesses, which have resulted in multiple extractions. Patient has an anterior tooth that is broken. Patient notes that her jaw and lip began to swell yesterday. Patient states, "I picked and scratch at it and was able to get some pus out of it". She denies any associated fevers. Patient has a Education officer, communitydentist, but notes that she is not planning on any further extraction. Patient states, "this one will just have to fall out by itself". Patient has been taking APAP at home for pain, which she reports has been effective.   Past Medical History:  Diagnosis Date  . Arthritis    "everywhere"  . Cardiomyopathy (HCC)   . CHF (congestive heart failure) (HCC)   . Chronic kidney disease    stones  . Diabetes mellitus without complication (HCC)    diet controlled  . GERD (gastroesophageal reflux disease)   . Hard of hearing   . Hypercholesteremia   . Hypertension   . Incontinence   . Obesity   . Sleep apnea    No CPAP since sinus surgery several yrs ago  . Wears dentures    partial upper    Past Surgical History:  Procedure Laterality Date  . CATARACT EXTRACTION W/PHACO Left 03/14/2015   Procedure: CATARACT EXTRACTION PHACO AND INTRAOCULAR LENS PLACEMENT (IOC);  Surgeon: Sherald HessAnita Prakash Vin-Parikh, MD;  [facility];  Service: Ophthalmology;  Laterality: Left;  DIABETIC - diet controlled  . DENTAL SURGERY    . IMPLANTABLE CARDIOVERTER DEFIBRILLATOR (ICD) GENERATOR CHANGE N/A 02/15/2015   Procedure: ICD GENERATOR CHANGE;  Surgeon: Marcina MillardAlexander Paraschos, MD;  Location: [facility];  Service: Cardiovascular;  Laterality: N/A;  . KIDNEY STONE SURGERY    . PACEMAKER INSERTION    . TONSILLECTOMY      Family History  Problem Relation Age of Onset  . Cancer Mother   . Cancer Father     Social History   Tobacco Use  . Smoking status: Never Smoker  . Smokeless tobacco: Never Used  Substance Use Topics  . Alcohol use: No  . Drug use: No    Patient Active Problem List   Diagnosis Date Noted  . Apnea, sleep 04/07/2014  . Cardiac defibrillator in place 04/07/2014  . Cardiomyopathy (HCC) 04/07/2014  . Acid reflux 04/07/2014  . Morbid obesity (HCC) 05/12/2008  . CARDIOMYOPATHY, PRIMARY, DILATED 05/12/2008  . LBBB 05/12/2008  . SYSTOLIC HEART FAILURE, ACUTE ON CHRONIC 05/12/2008  . AUTOMATIC IMPLANTABLE CARDIAC DEFIBRILLATOR SITU 05/12/2008  . Automatic implantable cardioverter-defibrillator in situ 05/12/2008  . 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 Block, bundle branch, left 05/12/2008    Home Medications:    Current Meds  Medication Sig  . acetaminophen (TYLENOL) 500 MG tablet Take 500 mg by mouth every 6 (six) hours as needed.  Marland Kitchen. aspirin 81 MG tablet Take 81 mg by mouth  daily.  . carvedilol (COREG) 25 MG tablet Take 25 mg by mouth 2 (two) times daily with a meal. Dr Saralyn Pilar  . cyclobenzaprine (FLEXERIL) 5 MG tablet Take 1 tablet (5 mg total) by mouth 2 (two) times daily as needed for muscle spasms.  Marland Kitchen ezetimibe (ZETIA) 10 MG tablet Take 10 mg by mouth at bedtime. Dr Saralyn Pilar  . glipiZIDE (GLUCOTROL XL) 10 MG 24 hr tablet TAKE 1 TABLET BY MOUTH TWICE A DAY  . lisinopril (PRINIVIL,ZESTRIL) 10 MG tablet Take 10 mg by mouth daily. Dr Saralyn Pilar  . Multiple Vitamin (MULTIVITAMIN) capsule  Take 1 capsule by mouth daily.  Marland Kitchen omeprazole (PRILOSEC) 20 MG capsule Take 20 mg by mouth at bedtime and may repeat dose one time if needed. Dr Saralyn Pilar  . [DISCONTINUED] Multiple Vitamins-Minerals (HAIR SKIN AND NAILS FORMULA PO) Take 1 capsule by mouth daily.    Allergies:   Patient has no known allergies.  Review of Systems (ROS): Review of Systems  Constitutional: Negative for chills and fever.  HENT: Positive for dental problem. Negative for drooling and trouble swallowing.   Respiratory: Negative for cough and shortness of breath.   Cardiovascular: Negative for chest pain and palpitations.  Gastrointestinal: Negative for nausea and vomiting.  All other systems reviewed and are negative.    Vital Signs: Today's Vitals   11/20/18 1204 11/20/18 1207 11/20/18 1241  BP:  133/68   Pulse:  75   Resp:  18   Temp:  98.4 F (36.9 C)   TempSrc:  Oral   SpO2:  99%   Weight: 200 lb (90.7 kg)    Height: 5' (1.524 m)    PainSc: 3   3     Physical Exam: Physical Exam  Constitutional: She is oriented to person, place, and time and well-developed, well-nourished, and in no distress.  HENT:  Head: Normocephalic and atraumatic.  Mouth/Throat: Mucous membranes are normal. Dental abscesses (RIGHT lower; multiple missing teeth; unable to identify tooth number) present.  Eyes: EOM are normal.  Cardiovascular: Normal rate.  Pulmonary/Chest: Effort normal.  Neurological: She is alert and oriented to person, place, and time. Gait normal. GCS score is 15.  Skin: Skin is warm and dry. No rash noted.  Psychiatric: Mood, memory, affect and judgment normal.  Nursing note and vitals reviewed.   Urgent Care Treatments / Results:   LABS: PLEASE NOTE: all labs that were ordered this encounter are listed, however only abnormal results are displayed. Labs Reviewed - No data to display  EKG: -None  RADIOLOGY: No results found.  PROCEDURES: Procedures  MEDICATIONS RECEIVED THIS VISIT:  Medications - No data to display  PERTINENT CLINICAL COURSE NOTES/UPDATES:   Initial Impression / Assessment and Plan / Urgent Care Course:  Pertinent labs & imaging results that were available during my care of the patient were personally reviewed by me and considered in my medical decision making (see lab/imaging section of note for values and interpretations).  Emma Guzman is a 73 y.o. female who presents to [facility] today with complaints of Dental Pain   Patient is well appearing overall in clinic today. She does not appear to be in any acute distress. Presenting symptoms (see HPI) and exam as documented above. Symptoms and exam consistent with developing abscess. Tooth is broken off and only a remnant remains above the gumline. Patient notes draining pus at home. Will cover with 7 day course of amoxicillin. Encouraged warm salt water gargles to help with pain and inflammation.  Pain has been controlled with APAP alone; patient to continue. She was encouraged to follow up with dentistry to discuss further management and need for extraction.   I have reviewed the follow up and strict return precautions for any new or worsening symptoms. Patient is aware of symptoms that would be deemed urgent/emergent, and would thus require further evaluation either here or in the emergency department. At the time of discharge, she verbalized understanding and consent with the discharge plan as it was reviewed with her. All questions were fielded by provider and/or clinic staff prior to patient discharge.    Final Clinical Impressions / Urgent Care Diagnoses:   Final diagnoses:  Dental abscess    New Prescriptions:  [place] Controlled Substance Registry consulted? Not Applicable  Meds ordered this encounter  Medications  . amoxicillin (AMOXIL) 500 MG capsule    Sig: Take 1 capsule (500 mg total) by mouth 2 (two) times daily.    Dispense:  14 capsule    Refill:  0    Recommended Follow up  Care:  Patient encouraged to follow up with the following provider within the specified time frame, or sooner as dictated by the severity of her symptoms. As always, she was instructed that for any urgent/emergent care needs, she should seek care either here or in the emergency department for more immediate evaluation.  Follow-up Information    Your dentist.         NOTE: This note was prepared using Dragon dictation software along with smaller phrase technology. Despite my best ability to proofread, there is the potential that transcriptional errors may still occur from this process, and are completely unintentional.    Verlee MonteGray,  E, NP 11/20/18 1321

## 2018-11-20 NOTE — Discharge Instructions (Signed)
It was very nice seeing you today in clinic. Thank you for entrusting me with your care.   Warm salt water gargles will help with pain and inflammation. May use Tylenol as needed for pain. Antibiotic as discussed. Your prescriptions have been called in to your pharmacy.   Make arrangements to follow up with your dentist in 1 week for re-evaluation if not improving. If your symptoms/condition worsens, please seek follow up care either here or in the ER. Please remember, our [place] providers are "right here with you" when you need [place].   Again, it was my pleasure to take care of you today. Thank you for choosing our clinic. I hope that you start to feel better quickly.   Honor Loh, MSN, APRN, FNP-C, CEN Advanced Practice Provider [facility]

## 2018-11-20 NOTE — ED Triage Notes (Signed)
Patient states that she has a tooth abscess starting with tenderness. Patient states that she was able to get some pus to drain yesterday. Patient states that she doesn't want to go in to the weekend without an antibiotic.

## 2019-09-14 ENCOUNTER — Other Ambulatory Visit: Payer: Self-pay | Admitting: Family Medicine

## 2019-09-14 DIAGNOSIS — Z780 Asymptomatic menopausal state: Secondary | ICD-10-CM

## 2019-09-14 DIAGNOSIS — Z1231 Encounter for screening mammogram for malignant neoplasm of breast: Secondary | ICD-10-CM

## 2020-09-14 ENCOUNTER — Other Ambulatory Visit: Payer: Self-pay | Admitting: Family Medicine

## 2020-09-14 DIAGNOSIS — Z780 Asymptomatic menopausal state: Secondary | ICD-10-CM

## 2020-09-14 DIAGNOSIS — Z1231 Encounter for screening mammogram for malignant neoplasm of breast: Secondary | ICD-10-CM

## 2020-09-28 ENCOUNTER — Encounter: Payer: Self-pay | Admitting: Emergency Medicine

## 2020-09-28 ENCOUNTER — Ambulatory Visit
Admission: EM | Admit: 2020-09-28 | Discharge: 2020-09-28 | Disposition: A | Discharge status: Home / Self Care | Payer: Medicare Other | Attending: Family Medicine | Admitting: Family Medicine

## 2020-09-28 ENCOUNTER — Ambulatory Visit (INDEPENDENT_AMBULATORY_CARE_PROVIDER_SITE_OTHER): Payer: Medicare Other

## 2020-09-28 ENCOUNTER — Other Ambulatory Visit: Payer: Self-pay

## 2020-09-28 DIAGNOSIS — S299XXA Unspecified injury of thorax, initial encounter: Secondary | ICD-10-CM | POA: Diagnosis not present

## 2020-09-28 DIAGNOSIS — R0782 Intercostal pain: Secondary | ICD-10-CM | POA: Diagnosis not present

## 2020-09-28 MED ORDER — TRAMADOL HCL 50 MG PO TABS: 50.0000 mg | ORAL_TABLET | Freq: Two times a day (BID) | ORAL | 0 refills | Status: DC | PRN

## 2020-09-28 NOTE — ED Triage Notes (Signed)
Pt states she fell in her home yesterday. She states she has bruising and pain on the left side of her rib cage, pain in her left pinkie toe an abrasion on her left.

## 2020-09-28 NOTE — Discharge Instructions (Addendum)
Rest.  Ice and heat to the area.  Medication as needed.  Take care  Dr. Adriana Simas

## 2020-09-28 NOTE — ED Provider Notes (Signed)
[facility]    CSN: [id number] Arrival date & time: 09/28/20  1456   History   Chief Complaint Chief Complaint  Patient presents with   Fall   Rib Injury    HPI 75 year old female presents with the above complaints.  Suffered a fall at home yesterday. Got up from the couch and somehow fell. She does not know how. No syncope, dizziness. No head injury. She states that she injured her left lower ribs. Also injured her left 5th toe. Pain currently 7/10 in severity. No relieving factors. Exacerbated by activity.  Past Medical History:  Diagnosis Date   Arthritis    "everywhere"   Cardiomyopathy [facility])    CHF (congestive heart failure) (HCC)    Chronic kidney disease    stones   Diabetes mellitus without complication (HCC)    diet controlled   GERD (gastroesophageal reflux disease)    Hard of hearing    Hypercholesteremia    Hypertension    Incontinence    Obesity    Sleep apnea    No CPAP since sinus surgery several yrs ago   Wears dentures    partial upper    Patient Active Problem List   Diagnosis Date Noted   Apnea, sleep 04/07/2014   Cardiac defibrillator in place 04/07/2014   Cardiomyopathy (HCC) 04/07/2014   Acid reflux 04/07/2014   Morbid obesity (HCC) 05/12/2008   CARDIOMYOPATHY, PRIMARY, DILATED 05/12/2008   LBBB 05/12/2008   SYSTOLIC HEART FAILURE, ACUTE ON CHRONIC 05/12/2008   AUTOMATIC IMPLANTABLE CARDIAC DEFIBRILLATOR SITU 05/12/2008   Automatic implantable cardioverter-defibrillator in situ 05/12/2008   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   Block, bundle branch, left 05/12/2008    Past Surgical History:  Procedure Laterality Date   CATARACT EXTRACTION W/PHACO Left 03/14/2015   Procedure: CATARACT EXTRACTION PHACO AND INTRAOCULAR LENS PLACEMENT (IOC);  Surgeon: Sherald Hess, MD;  Location: [facility];  Service: Ophthalmology;  Laterality: Left;  DIABETIC - diet controlled   DENTAL SURGERY     IMPLANTABLE  CARDIOVERTER DEFIBRILLATOR (ICD) GENERATOR CHANGE N/A 02/15/2015   Procedure: ICD GENERATOR CHANGE;  Surgeon: Marcina Millard, MD;  Location: [facility];  Service: Cardiovascular;  Laterality: N/A;   KIDNEY STONE SURGERY     PACEMAKER INSERTION     TONSILLECTOMY      OB History   No obstetric history on file.      Home Medications    Prior to Admission medications   Medication Sig Start Date End Date Taking? Authorizing Provider  acetaminophen (TYLENOL) 500 MG tablet Take 500 mg by mouth every 6 (six) hours as needed.   Yes [provider]  aspirin 81 MG tablet Take 81 mg by mouth daily.   Yes [provider]  carvedilol (COREG) 25 MG tablet Take 25 mg by mouth 2 (two) times daily with a meal. Dr Darrold Junker   Yes [provider]  cyclobenzaprine (FLEXERIL) 5 MG tablet Take 1 tablet (5 mg total) by mouth 2 (two) times daily as needed for muscle spasms. 09/08/15  Yes Duanne Limerick, MD  ezetimibe (ZETIA) 10 MG tablet Take 10 mg by mouth at bedtime. Dr Darrold Junker   Yes [provider]  glipiZIDE (GLUCOTROL XL) 10 MG 24 hr tablet TAKE 1 TABLET BY MOUTH TWICE A DAY 06/01/18  Yes [provider]  lisinopril (PRINIVIL,ZESTRIL) 10 MG tablet Take 10 mg by mouth daily. Dr Darrold Junker   Yes [provider]  Multiple Vitamin (MULTIVITAMIN) capsule Take 1 capsule  by mouth daily.   Yes [provider]  omeprazole (PRILOSEC) 20 MG capsule Take 20 mg by mouth at bedtime and may repeat dose one time if needed. Dr Darrold Junker   Yes [provider]  traMADol (ULTRAM) 50 MG tablet Take 1 tablet (50 mg total) by mouth every 12 (twelve) hours as needed. 09/28/20  Yes ,  G, DO  amoxicillin (AMOXIL) 500 MG capsule Take 1 capsule (500 mg total) by mouth 2 (two) times daily. 11/20/18   Verlee Monte, NP    Family History Family History  Problem Relation Age of Onset   Cancer Mother    Cancer Father     Social History Social History    Tobacco Use   Smoking status: Never   Smokeless tobacco: Never  Vaping Use   Vaping Use: Never used  Substance Use Topics   Alcohol use: No   Drug use: No     Allergies   Patient has no known allergies.   Review of Systems Review of Systems Per HPI  Physical Exam Triage Vital Signs ED Triage Vitals  Enc Vitals Group     BP 09/28/20 1527 110/80     Pulse Rate 09/28/20 1527 95     Resp 09/28/20 1527 18     Temp 09/28/20 1527 98.5 F (36.9 C)     Temp Source 09/28/20 1527 Oral     SpO2 09/28/20 1527 96 %     Weight 09/28/20 1529 199 lb 15.3 oz (90.7 kg)     Height 09/28/20 1529 5' (1.524 m)     Head Circumference --      Peak Flow --      Pain Score 09/28/20 1528 7     Pain Loc --      Pain Edu? --      Excl. in GC? --    Updated Vital Signs BP 110/80 (BP Location: Right Arm)   Pulse 95   Temp 98.5 F (36.9 C) (Oral)   Resp 18   Ht 5' (1.524 m)   Wt 90.7 kg   SpO2 96%   BMI 39.05 kg/m   Visual Acuity Right Eye Distance:   Left Eye Distance:   Bilateral Distance:    Right Eye Near:   Left Eye Near:    Bilateral Near:     Physical Exam Vitals and nursing note reviewed.  Constitutional:      General: She is not in acute distress.    Appearance: Normal appearance. She is not ill-appearing.  HENT:     Head: Normocephalic and atraumatic.  Cardiovascular:     Rate and Rhythm: Normal rate and regular rhythm.  Pulmonary:     Effort: Pulmonary effort is normal.     Breath sounds: Normal breath sounds.  Chest:     Comments: Tenderness just below the left breast (ribs). Neurological:     Mental Status: She is alert.  Psychiatric:        Mood and Affect: Mood normal.        Behavior: Behavior normal.     UC Treatments / Results  Labs (all labs ordered are listed, but only abnormal results are displayed) Labs Reviewed - No data to display  EKG   Radiology DG Ribs Unilateral W/Chest Left  Result Date: 09/28/2020 CLINICAL DATA:  Fall,  injury yesterday, anterior rib pain EXAM: LEFT RIBS AND CHEST - 3+ VIEW COMPARISON:  Radiograph 01/09/2016 FINDINGS: Pacer/AICD battery pack overlies the left chest wall with  paired leads directed towards the cardiac apex/septum, and additional leads in the coronary sinus and right atrium. Leads appear well seated and contiguous, unchanged in positioning from comparison prior. No visible displaced rib fractures are seen. No pneumothorax or visible effusion. No consolidation or edema. Stable cardiomediastinal contours. No other acute or conspicuous abnormality of the chest wall. IMPRESSION: No visible displaced rib fracture or other acute abnormality of the chest wall. No acute cardiopulmonary abnormality. Battery pack and pacer/defibrillator leads in stable positioning. Electronically Signed   By: Kreg Shropshire M.D.   On: 09/28/2020 16:40    Procedures Procedures (including critical care time)  Medications Ordered in UC Medications - No data to display  Initial Impression / Assessment and Plan / UC Course  I have reviewed the triage vital signs and the nursing notes.  Pertinent labs & imaging results that were available during my care of the patient were reviewed by me and considered in my medical decision making (see chart for details).    75 year old female presents with a rib injury. Xray obtained and was independently reviewed by me. Interpretation: No apparent rib fracture. Tramadol as needed. Supportive care.   Final Clinical Impressions(s) / UC Diagnoses   Final diagnoses:  Rib injury     Discharge Instructions      Rest.  Ice and heat to the area.  Medication as needed.  Take care  Dr. Adriana Simas    ED Prescriptions     Medication Sig Dispense Auth. Provider   traMADol (ULTRAM) 50 MG tablet Take 1 tablet (50 mg total) by mouth every 12 (twelve) hours as needed. 10 tablet Everlene Other G, DO      I have reviewed the PDMP during this encounter.   Tommie Sams, [place] 09/29/20  2227

## 2020-11-07 ENCOUNTER — Ambulatory Visit
Admission: RE | Admit: 2020-11-07 | Discharge: 2020-11-07 | Disposition: A | Discharge status: Home / Self Care | Payer: Medicare Other | Source: Ambulatory Visit | Attending: Family Medicine | Admitting: Family Medicine

## 2020-11-07 ENCOUNTER — Other Ambulatory Visit: Payer: Self-pay

## 2020-11-07 DIAGNOSIS — Z1231 Encounter for screening mammogram for malignant neoplasm of breast: Secondary | ICD-10-CM | POA: Insufficient documentation

## 2020-11-07 DIAGNOSIS — Z780 Asymptomatic menopausal state: Secondary | ICD-10-CM | POA: Diagnosis present

## 2021-08-07 ENCOUNTER — Other Ambulatory Visit: Payer: Self-pay | Admitting: Family Medicine

## 2021-08-07 DIAGNOSIS — I5022 Chronic systolic (congestive) heart failure: Secondary | ICD-10-CM

## 2021-08-07 DIAGNOSIS — R634 Abnormal weight loss: Secondary | ICD-10-CM

## 2021-08-18 ENCOUNTER — Ambulatory Visit
Admission: RE | Admit: 2021-08-18 | Discharge: 2021-08-18 | Disposition: A | Discharge status: Home / Self Care | Payer: Medicare Other | Source: Ambulatory Visit | Attending: Family Medicine | Admitting: Family Medicine

## 2021-08-18 DIAGNOSIS — I5022 Chronic systolic (congestive) heart failure: Secondary | ICD-10-CM | POA: Diagnosis present

## 2021-08-18 DIAGNOSIS — R634 Abnormal weight loss: Secondary | ICD-10-CM

## 2021-08-18 MED ORDER — IOHEXOL 300 MG/ML  SOLN
100.0000 mL | Freq: Once | INTRAMUSCULAR | Status: AC | PRN
  Administered 2021-08-18: 100 mL via INTRAVENOUS

## 2022-10-24 ENCOUNTER — Other Ambulatory Visit: Payer: Self-pay

## 2022-10-24 ENCOUNTER — Emergency Department: Payer: Medicare Other

## 2022-10-24 ENCOUNTER — Inpatient Hospital Stay
Admission: EM | Admit: 2022-10-24 | Discharge: 2022-10-29 | DRG: 638 | Disposition: A | Discharge status: Skilled Nursing Facility | Payer: Medicare Other | Attending: Internal Medicine | Admitting: Internal Medicine

## 2022-10-24 ENCOUNTER — Observation Stay: Payer: Medicare Other

## 2022-10-24 ENCOUNTER — Encounter: Payer: Self-pay | Admitting: Internal Medicine

## 2022-10-24 DIAGNOSIS — I429 Cardiomyopathy, unspecified: Secondary | ICD-10-CM

## 2022-10-24 DIAGNOSIS — E7800 Pure hypercholesterolemia, unspecified: Secondary | ICD-10-CM

## 2022-10-24 DIAGNOSIS — Z91148 Patient's other noncompliance with medication regimen for other reason: Secondary | ICD-10-CM

## 2022-10-24 DIAGNOSIS — Z961 Presence of intraocular lens: Secondary | ICD-10-CM | POA: Diagnosis present

## 2022-10-24 DIAGNOSIS — Y92009 Unspecified place in unspecified non-institutional (private) residence as the place of occurrence of the external cause: Secondary | ICD-10-CM

## 2022-10-24 DIAGNOSIS — N390 Urinary tract infection, site not specified: Secondary | ICD-10-CM | POA: Diagnosis present

## 2022-10-24 DIAGNOSIS — I10 Essential (primary) hypertension: Secondary | ICD-10-CM | POA: Diagnosis present

## 2022-10-24 DIAGNOSIS — K5900 Constipation, unspecified: Secondary | ICD-10-CM | POA: Diagnosis present

## 2022-10-24 DIAGNOSIS — E860 Dehydration: Secondary | ICD-10-CM | POA: Diagnosis present

## 2022-10-24 DIAGNOSIS — Z7984 Long term (current) use of oral hypoglycemic drugs: Secondary | ICD-10-CM

## 2022-10-24 DIAGNOSIS — W06XXXA Fall from bed, initial encounter: Secondary | ICD-10-CM | POA: Diagnosis present

## 2022-10-24 DIAGNOSIS — K219 Gastro-esophageal reflux disease without esophagitis: Secondary | ICD-10-CM | POA: Diagnosis present

## 2022-10-24 DIAGNOSIS — N3000 Acute cystitis without hematuria: Secondary | ICD-10-CM

## 2022-10-24 DIAGNOSIS — W19XXXA Unspecified fall, initial encounter: Secondary | ICD-10-CM

## 2022-10-24 DIAGNOSIS — E119 Type 2 diabetes mellitus without complications: Secondary | ICD-10-CM

## 2022-10-24 DIAGNOSIS — Z79899 Other long term (current) drug therapy: Secondary | ICD-10-CM

## 2022-10-24 DIAGNOSIS — I5022 Chronic systolic (congestive) heart failure: Secondary | ICD-10-CM

## 2022-10-24 DIAGNOSIS — E1110 Type 2 diabetes mellitus with ketoacidosis without coma: Principal | ICD-10-CM | POA: Diagnosis present

## 2022-10-24 DIAGNOSIS — I43 Cardiomyopathy in diseases classified elsewhere: Secondary | ICD-10-CM | POA: Diagnosis present

## 2022-10-24 DIAGNOSIS — E0810 Diabetes mellitus due to underlying condition with ketoacidosis without coma: Secondary | ICD-10-CM

## 2022-10-24 DIAGNOSIS — Z7982 Long term (current) use of aspirin: Secondary | ICD-10-CM

## 2022-10-24 DIAGNOSIS — I130 Hypertensive heart and chronic kidney disease with heart failure and stage 1 through stage 4 chronic kidney disease, or unspecified chronic kidney disease: Secondary | ICD-10-CM | POA: Diagnosis present

## 2022-10-24 DIAGNOSIS — N189 Chronic kidney disease, unspecified: Secondary | ICD-10-CM | POA: Diagnosis present

## 2022-10-24 DIAGNOSIS — B9620 Unspecified Escherichia coli [E. coli] as the cause of diseases classified elsewhere: Secondary | ICD-10-CM | POA: Diagnosis present

## 2022-10-24 DIAGNOSIS — R7989 Other specified abnormal findings of blood chemistry: Secondary | ICD-10-CM

## 2022-10-24 DIAGNOSIS — E1122 Type 2 diabetes mellitus with diabetic chronic kidney disease: Secondary | ICD-10-CM | POA: Diagnosis present

## 2022-10-24 DIAGNOSIS — Z950 Presence of cardiac pacemaker: Secondary | ICD-10-CM

## 2022-10-24 DIAGNOSIS — R748 Abnormal levels of other serum enzymes: Secondary | ICD-10-CM

## 2022-10-24 DIAGNOSIS — E876 Hypokalemia: Secondary | ICD-10-CM | POA: Diagnosis not present

## 2022-10-24 DIAGNOSIS — Z9842 Cataract extraction status, left eye: Secondary | ICD-10-CM

## 2022-10-24 LAB — CBC
HCT: 43.7 % (ref 36.0–46.0)
Hemoglobin: 14.9 g/dL (ref 12.0–15.0)
MCH: 29.6 pg (ref 26.0–34.0)
MCHC: 34.1 g/dL (ref 30.0–36.0)
MCV: 86.7 fL (ref 80.0–100.0)
Platelets: 204 10*3/uL (ref 150–400)
RBC: 5.04 MIL/uL (ref 3.87–5.11)
RDW: 12.5 % (ref 11.5–15.5)
WBC: 11 10*3/uL — ABNORMAL HIGH (ref 4.0–10.5)
nRBC: 0 % (ref 0.0–0.2)

## 2022-10-24 LAB — COMPREHENSIVE METABOLIC PANEL
ALT: 13 U/L (ref 0–44)
AST: 22 U/L (ref 15–41)
Albumin: 3.7 g/dL (ref 3.5–5.0)
Alkaline Phosphatase: 52 U/L (ref 38–126)
Anion gap: 18 — ABNORMAL HIGH (ref 5–15)
BUN: 25 mg/dL — ABNORMAL HIGH (ref 8–23)
CO2: 16 mmol/L — ABNORMAL LOW (ref 22–32)
Calcium: 9.2 mg/dL (ref 8.9–10.3)
Chloride: 100 mmol/L (ref 98–111)
Creatinine, Ser: 0.86 mg/dL (ref 0.44–1.00)
GFR, Estimated: >60 mL/min (ref >60)
Glucose, Bld: 364 mg/dL — ABNORMAL HIGH (ref 70–99)
Potassium: 4.2 mmol/L (ref 3.5–5.1)
Sodium: 134 mmol/L — ABNORMAL LOW (ref 135–145)
Total Bilirubin: 2.2 mg/dL — ABNORMAL HIGH (ref 0.3–1.2)
Total Protein: 7.2 g/dL (ref 6.5–8.1)

## 2022-10-24 LAB — BLOOD GAS, VENOUS
Acid-base deficit: 5.4 mmol/L — ABNORMAL HIGH (ref 0.0–2.0)
Bicarbonate: 20.6 mmol/L (ref 20.0–28.0)
O2 Saturation: 73 %
Patient temperature: 37
pCO2, Ven: 41 mmHg — ABNORMAL LOW (ref 44–60)
pH, Ven: 7.31 (ref 7.25–7.43)
pO2, Ven: 47 mmHg — ABNORMAL HIGH (ref 32–45)

## 2022-10-24 LAB — GLUCOSE, CAPILLARY
Glucose-Capillary: 278 mg/dL — ABNORMAL HIGH (ref 70–99)
Glucose-Capillary: 179 mg/dL — ABNORMAL HIGH (ref 70–99)
Glucose-Capillary: 223 mg/dL — ABNORMAL HIGH (ref 70–99)
Glucose-Capillary: 184 mg/dL — ABNORMAL HIGH (ref 70–99)
Glucose-Capillary: 160 mg/dL — ABNORMAL HIGH (ref 70–99)
Glucose-Capillary: 132 mg/dL — ABNORMAL HIGH (ref 70–99)

## 2022-10-24 LAB — URINALYSIS, ROUTINE W REFLEX MICROSCOPIC
Bilirubin Urine: NEGATIVE
Glucose, UA: >=500 mg/dL — AB
Ketones, ur: 80 mg/dL — AB
Nitrite: NEGATIVE
Protein, ur: 30 mg/dL — AB
Specific Gravity, Urine: 1.022 (ref 1.005–1.030)
WBC, UA: >50 WBC/hpf (ref 0–5)
pH: 5 (ref 5.0–8.0)

## 2022-10-24 LAB — LACTIC ACID, PLASMA
Lactic Acid, Venous: 2.9 mmol/L (ref 0.5–1.9)
Lactic Acid, Venous: 1.6 mmol/L (ref 0.5–1.9)
Lactic Acid, Venous: 1.5 mmol/L (ref 0.5–1.9)

## 2022-10-24 LAB — BASIC METABOLIC PANEL
Anion gap: 14 (ref 5–15)
Anion gap: 12 (ref 5–15)
BUN: 25 mg/dL — ABNORMAL HIGH (ref 8–23)
BUN: 25 mg/dL — ABNORMAL HIGH (ref 8–23)
CO2: 19 mmol/L — ABNORMAL LOW (ref 22–32)
CO2: 22 mmol/L (ref 22–32)
Calcium: 9.1 mg/dL (ref 8.9–10.3)
Calcium: 9.1 mg/dL (ref 8.9–10.3)
Chloride: 104 mmol/L (ref 98–111)
Chloride: 104 mmol/L (ref 98–111)
Creatinine, Ser: 0.72 mg/dL (ref 0.44–1.00)
Creatinine, Ser: 0.56 mg/dL (ref 0.44–1.00)
GFR, Estimated: >60 mL/min (ref >60)
GFR, Estimated: >60 mL/min (ref >60)
Glucose, Bld: 299 mg/dL — ABNORMAL HIGH (ref 70–99)
Glucose, Bld: 195 mg/dL — ABNORMAL HIGH (ref 70–99)
Potassium: 4.2 mmol/L (ref 3.5–5.1)
Potassium: 3.5 mmol/L (ref 3.5–5.1)
Sodium: 137 mmol/L (ref 135–145)
Sodium: 138 mmol/L (ref 135–145)

## 2022-10-24 LAB — HEMOGLOBIN A1C
Hgb A1c MFr Bld: 11.6 % — ABNORMAL HIGH (ref 4.8–5.6)
Mean Plasma Glucose: 286.22 mg/dL

## 2022-10-24 LAB — MRSA NEXT GEN BY PCR, NASAL: MRSA by PCR Next Gen: NOT DETECTED

## 2022-10-24 LAB — CBG MONITORING, ED: Glucose-Capillary: 296 mg/dL — ABNORMAL HIGH (ref 70–99)

## 2022-10-24 LAB — CK: Total CK: 310 U/L — ABNORMAL HIGH (ref 38–234)

## 2022-10-24 LAB — BRAIN NATRIURETIC PEPTIDE: B Natriuretic Peptide: 41.4 pg/mL (ref 0.0–100.0)

## 2022-10-24 LAB — BETA-HYDROXYBUTYRIC ACID: Beta-Hydroxybutyric Acid: 5.23 mmol/L — ABNORMAL HIGH (ref 0.05–0.27)

## 2022-10-24 MED ORDER — DEXTROSE IN LACTATED RINGERS 5 % IV SOLN: INTRAVENOUS | Status: DC

## 2022-10-24 MED ORDER — ORAL CARE MOUTH RINSE: 15.0000 mL | OROMUCOSAL | Status: DC | PRN

## 2022-10-24 MED ORDER — CARVEDILOL 25 MG PO TABS
25.0000 mg | ORAL_TABLET | Freq: Two times a day (BID) | ORAL | Status: DC
  Administered 2022-10-24 – 2022-10-29 (×11): 25 mg via ORAL
  Filled 2022-10-24 (×11): qty 1

## 2022-10-24 MED ORDER — INSULIN ASPART 100 UNIT/ML IJ SOLN
0.0000 [IU] | INTRAMUSCULAR | Status: DC
  Administered 2022-10-25 (×2): 3 [IU] via SUBCUTANEOUS
  Administered 2022-10-25: 4 [IU] via SUBCUTANEOUS
  Administered 2022-10-25: 3 [IU] via SUBCUTANEOUS
  Filled 2022-10-24 (×4): qty 1

## 2022-10-24 MED ORDER — LACTATED RINGERS IV BOLUS: 20.0000 mL/kg | Freq: Once | INTRAVENOUS | Status: DC

## 2022-10-24 MED ORDER — SODIUM CHLORIDE 0.9 % IV SOLN
1.0000 g | INTRAVENOUS | Status: DC
  Administered 2022-10-24 – 2022-10-26 (×3): 1 g via INTRAVENOUS
  Filled 2022-10-24 (×4): qty 10

## 2022-10-24 MED ORDER — LACTATED RINGERS IV BOLUS
20.0000 mL/kg | Freq: Once | INTRAVENOUS | Status: AC
  Administered 2022-10-24: 1044 mL via INTRAVENOUS

## 2022-10-24 MED ORDER — CHLORHEXIDINE GLUCONATE CLOTH 2 % EX PADS
6.0000 | MEDICATED_PAD | Freq: Every day | CUTANEOUS | Status: DC
  Administered 2022-10-24 – 2022-10-26 (×3): 6 via TOPICAL

## 2022-10-24 MED ORDER — INSULIN GLARGINE-YFGN 100 UNIT/ML ~~LOC~~ SOLN
5.0000 [IU] | Freq: Once | SUBCUTANEOUS | Status: AC
  Administered 2022-10-24: 5 [IU] via SUBCUTANEOUS
  Filled 2022-10-24: qty 0.05

## 2022-10-24 MED ORDER — PANTOPRAZOLE SODIUM 40 MG PO TBEC
40.0000 mg | DELAYED_RELEASE_TABLET | Freq: Every day | ORAL | Status: DC
  Administered 2022-10-24 – 2022-10-29 (×6): 40 mg via ORAL
  Filled 2022-10-24 (×6): qty 1

## 2022-10-24 MED ORDER — ENOXAPARIN SODIUM 40 MG/0.4ML IJ SOSY
40.0000 mg | PREFILLED_SYRINGE | INTRAMUSCULAR | Status: DC
  Administered 2022-10-24 – 2022-10-29 (×6): 40 mg via SUBCUTANEOUS
  Filled 2022-10-24 (×6): qty 0.4

## 2022-10-24 MED ORDER — HYDRALAZINE HCL 20 MG/ML IJ SOLN: 5.0000 mg | INTRAMUSCULAR | Status: DC | PRN

## 2022-10-24 MED ORDER — INSULIN REGULAR(HUMAN) IN NACL 100-0.9 UT/100ML-% IV SOLN
INTRAVENOUS | Status: DC
  Administered 2022-10-24: 8.5 [IU]/h via INTRAVENOUS
  Filled 2022-10-24 (×2): qty 100

## 2022-10-24 MED ORDER — INSULIN REGULAR(HUMAN) IN NACL 100-0.9 UT/100ML-% IV SOLN: INTRAVENOUS | Status: DC

## 2022-10-24 MED ORDER — POTASSIUM CHLORIDE 10 MEQ/100ML IV SOLN
10.0000 meq | INTRAVENOUS | Status: AC
  Administered 2022-10-24 (×2): 10 meq via INTRAVENOUS
  Filled 2022-10-24 (×3): qty 100

## 2022-10-24 MED ORDER — ACETAMINOPHEN 325 MG PO TABS
650.0000 mg | ORAL_TABLET | Freq: Four times a day (QID) | ORAL | Status: DC | PRN
  Administered 2022-10-26 – 2022-10-29 (×9): 650 mg via ORAL
  Filled 2022-10-24 (×9): qty 2

## 2022-10-24 MED ORDER — LACTATED RINGERS IV SOLN: INTRAVENOUS | Status: DC

## 2022-10-24 MED ORDER — DEXTROSE 50 % IV SOLN: 0.0000 mL | INTRAVENOUS | Status: DC | PRN

## 2022-10-24 MED ORDER — SODIUM CHLORIDE 0.9 % IV BOLUS
1000.0000 mL | Freq: Once | INTRAVENOUS | Status: AC
  Administered 2022-10-24: 1000 mL via INTRAVENOUS

## 2022-10-24 MED ORDER — ONDANSETRON HCL 4 MG/2ML IJ SOLN: 4.0000 mg | Freq: Three times a day (TID) | INTRAMUSCULAR | Status: DC | PRN

## 2022-10-24 MED ORDER — SODIUM CHLORIDE 0.9 % IV SOLN: INTRAVENOUS | Status: DC

## 2022-10-24 MED ORDER — SODIUM CHLORIDE 0.9 % IV BOLUS
500.0000 mL | Freq: Once | INTRAVENOUS | Status: AC
  Administered 2022-10-24: 500 mL via INTRAVENOUS

## 2022-10-24 NOTE — H&P (Signed)
History and Physical    Emma Guzman MEQ:[id number] DOB: [birth year] DOA: 10/24/2022  Referring MD/NP/PA:   PCP: Rayetta Humphrey, MD   Patient coming from:  The patient is coming from home.     Chief Complaint: fall and weakness  HPI: Emma Guzman is a 77 y.o. female with medical history significant of HTN, HLD, DM, sCHF, AICD, GERD, HOH, LBBB, who presents with fall and weakness.   Per his son at bedside, patient rolled out of the bed possibly in the Sunday night, found on the floor.  Patient states that she has been on the ground for several days.  She has generalized weakness, could not reach her roller. No head or neck injury.  She has pain in both knees, which is chronic issue.  No chest pain, cough, shortness of breath.  No fever or chills.  No nausea, vomiting, diarrhea or abdominal pain.  Patient has increased urinary frequency, no dysuria or burning with urination.  Data reviewed independently and ED Course: pt was found to have WBC 11.0,, lactic acid 2.9, anion gap 18, bicarbonate 16, urine ketone 80, beta hydroxybutyric acid of 5.23, positive urinalysis (hazy appearance, moderate amount of leukocyte, rare bacteria, WBC> 50), CKD 310, temperature normal, blood pressure 141/101, heart rate 118 --> 89, RR 18, oxygen saturation 96% on room air.  VBG with pH 7.31, CO2 41, O2 47.  Chest x-ray negative.  CT head negative.  Patient is placed on stepdown for observation.   EKG: I have personally reviewed.  Paced rhythm, QTc 520   Review of Systems:   General: no fevers, chills, no body weight gain, has poor appetite, has fatigue HEENT: no blurry vision, hearing changes or sore throat Respiratory: no dyspnea, coughing, wheezing CV: no chest pain, no palpitations GI: no nausea, vomiting, abdominal pain, diarrhea, constipation GU: no dysuria, burning on urination, has increased urinary frequency, no hematuria  Ext: no leg edema Neuro: no unilateral weakness, numbness, or  tingling, no vision change or hearing loss. Has fall Skin: no rash, no skin tear. MSK: No muscle spasm, no deformity, no limitation of range of movement in spin. Has pain in both knees Heme: No easy bruising.  Travel history: No recent long distant travel.   Allergy: No Known Allergies  Past Medical History:  Diagnosis Date   Arthritis    "everywhere"   Cardiomyopathy (HCC)    CHF (congestive heart failure) (HCC)    Chronic kidney disease    stones   Diabetes mellitus without complication (HCC)    diet controlled   GERD (gastroesophageal reflux disease)    Hard of hearing    Hypercholesteremia    Hypertension    Incontinence    Obesity    Sleep apnea    No CPAP since sinus surgery several yrs ago   Wears dentures    partial upper    Past Surgical History:  Procedure Laterality Date   CATARACT EXTRACTION W/PHACO Left 03/14/2015   Procedure: CATARACT EXTRACTION PHACO AND INTRAOCULAR LENS PLACEMENT (IOC);  Surgeon: Sherald Hess, MD;  Location: [facility];  Service: Ophthalmology;  Laterality: Left;  DIABETIC - diet controlled   DENTAL SURGERY     IMPLANTABLE CARDIOVERTER DEFIBRILLATOR (ICD) GENERATOR CHANGE N/A 02/15/2015   Procedure: ICD GENERATOR CHANGE;  Surgeon: Marcina Millard, MD;  Location: [facility];  Service: Cardiovascular;  Laterality: N/A;   KIDNEY STONE SURGERY     PACEMAKER INSERTION     TONSILLECTOMY  Social History:  reports that she has never smoked. She has never used smokeless tobacco. She reports that she does not drink alcohol and does not use drugs.  Family History:  Family History  Problem Relation Age of Onset   Cancer Mother    Cancer Father    Breast cancer Neg Hx      Prior to Admission medications   Medication Sig Start Date End Date Taking? Authorizing Provider  acetaminophen (TYLENOL) 500 MG tablet Take 500 mg by mouth every 6 (six) hours as needed.    [provider]  amoxicillin (AMOXIL) 500 MG  capsule Take 1 capsule (500 mg total) by mouth 2 (two) times daily. 11/20/18   Emma Monte, NP  aspirin 81 MG tablet Take 81 mg by mouth daily.    [provider]  carvedilol (COREG) 25 MG tablet Take 25 mg by mouth 2 (two) times daily with a meal. Dr Darrold Junker    [provider]  cyclobenzaprine (FLEXERIL) 5 MG tablet Take 1 tablet (5 mg total) by mouth 2 (two) times daily as needed for muscle spasms. 09/08/15   Duanne Limerick, MD  ezetimibe (ZETIA) 10 MG tablet Take 10 mg by mouth at bedtime. Dr Darrold Junker    [provider]  glipiZIDE (GLUCOTROL XL) 10 MG 24 hr tablet TAKE 1 TABLET BY MOUTH TWICE A DAY 06/01/18   [provider]  lisinopril (PRINIVIL,ZESTRIL) 10 MG tablet Take 10 mg by mouth daily. Dr Darrold Junker    [provider]  Multiple Vitamin (MULTIVITAMIN) capsule Take 1 capsule by mouth daily.    [provider]  omeprazole (PRILOSEC) 20 MG capsule Take 20 mg by mouth at bedtime and may repeat dose one time if needed. Dr Darrold Junker    [provider]  traMADol (ULTRAM) 50 MG tablet Take 1 tablet (50 mg total) by mouth every 12 (twelve) hours as needed. 09/28/20   Tommie Sams, DO    Physical Exam: Vitals:   10/24/22 1400 10/24/22 1430 10/24/22 1500 10/24/22 1600  BP: 139/89 (!) 148/94 (!) 141/85 (!) 141/101  Pulse: (!) 113 (!) 107 (!) 106 98  Resp: (!) 8 (!) 0 18 14  Temp:      TempSrc:      SpO2: 96% 96% 97% 96%  Weight:      Height:       General: Not in acute distress.  Dry mucous membranes HEENT:       Eyes: PERRL, EOMI, no jaundice       ENT: No discharge from the ears and nose, no pharynx injection, no tonsillar enlargement.        Neck: No JVD, no bruit, no mass felt. Heme: No neck lymph node enlargement. Cardiac: S1/S2, RRR, No murmurs, No gallops or rubs. Respiratory: No rales, wheezing, rhonchi or rubs. GI: Soft, nondistended, nontender, no rebound pain, no organomegaly, BS present. GU: No hematuria Ext:  No pitting leg edema bilaterally. 1+DP/PT pulse bilaterally. Musculoskeletal: No joint deformities, No joint redness or warmth, no limitation of ROM in spin.  Has tenderness in both knees, no effusion or swelling in knees Skin: No rashes.  Neuro: Alert, oriented X3, cranial nerves II-XII grossly intact, moves all extremities normally.  Psych: Patient is not psychotic, no suicidal or hemocidal ideation.  Labs on Admission: I have personally reviewed following labs and imaging studies  CBC: Recent Labs  Lab 10/24/22 1226  WBC 11.0*  HGB 14.9  HCT 43.7  MCV 86.7  PLT 204   Basic Metabolic Panel: Recent Labs  Lab 10/24/22 1226  NA 134*  K 4.2  CL 100  CO2 16*  GLUCOSE 364*  BUN 25*  CREATININE 0.86  CALCIUM 9.2   GFR: Estimated Creatinine Clearance: 41.3 mL/min (by C-G formula based on SCr of 0.86 mg/dL). Liver Function Tests: Recent Labs  Lab 10/24/22 1226  AST 22  ALT 13  ALKPHOS 52  BILITOT 2.2*  PROT 7.2  ALBUMIN 3.7   No results for input(s): "LIPASE", "AMYLASE" in the last 168 hours. No results for input(s): "AMMONIA" in the last 168 hours. Coagulation Profile: No results for input(s): "INR", "PROTIME" in the last 168 hours. Cardiac Enzymes: Recent Labs  Lab 10/24/22 1226  CKTOTAL 310*   BNP (last 3 results) No results for input(s): "PROBNP" in the last 8760 hours. HbA1C: No results for input(s): "HGBA1C" in the last 72 hours. CBG: Recent Labs  Lab 10/24/22 1722  GLUCAP 296*   Lipid Profile: No results for input(s): "CHOL", "HDL", "LDLCALC", "TRIG", "CHOLHDL", "LDLDIRECT" in the last 72 hours. Thyroid Function Tests: No results for input(s): "TSH", "T4TOTAL", "FREET4", "T3FREE", "THYROIDAB" in the last 72 hours. Anemia Panel: No results for input(s): "VITAMINB12", "FOLATE", "FERRITIN", "TIBC", "IRON", "RETICCTPCT" in the last 72 hours. Urine analysis:    Component Value Date/Time   COLORURINE YELLOW (A) 10/24/2022 1556   APPEARANCEUR HAZY  (A) 10/24/2022 1556   LABSPEC 1.022 10/24/2022 1556   PHURINE 5.0 10/24/2022 1556   GLUCOSEU >=500 (A) 10/24/2022 1556   HGBUR SMALL (A) 10/24/2022 1556   BILIRUBINUR NEGATIVE 10/24/2022 1556   KETONESUR 80 (A) 10/24/2022 1556   PROTEINUR 30 (A) 10/24/2022 1556   NITRITE NEGATIVE 10/24/2022 1556   LEUKOCYTESUR MODERATE (A) 10/24/2022 1556   Sepsis Labs: @LABRCNTIP (procalcitonin:4,lacticidven:4) )No results found for this or any previous visit (from the past 240 hour(s)).   Radiological Exams on Admission: DG Chest Port 1 View  Result Date: 10/24/2022 CLINICAL DATA:  Syncope. EXAM: PORTABLE CHEST 1 VIEW COMPARISON:  September 28, 2020. FINDINGS: The heart size and mediastinal contours are within normal limits. Left-sided defibrillator is unchanged in position. Both lungs are clear. The visualized skeletal structures are unremarkable. IMPRESSION: No active disease. Electronically Signed   By: Lupita Raider M.D.   On: 10/24/2022 15:56   CT Head Wo Contrast  Result Date: 10/24/2022 CLINICAL DATA:  Mental status change EXAM: CT HEAD WITHOUT CONTRAST TECHNIQUE: Contiguous axial images were obtained from the base of the skull through the vertex without intravenous contrast. RADIATION DOSE REDUCTION: This exam was performed according to the departmental dose-optimization program which includes automated exposure control, adjustment of the mA and/or kV according to patient size and/or use of iterative reconstruction technique. COMPARISON:  CT head 03/19/2017 FINDINGS: Brain: No intracranial hemorrhage, mass effect, or evidence of acute infarct. No hydrocephalus. No extra-axial fluid collection. Generalized cerebral atrophy. Ill-defined hypoattenuation within the cerebral white matter is nonspecific but consistent with chronic small vessel ischemic disease. Vascular: No hyperdense vessel. Intracranial arterial calcification. Skull: No fracture or focal lesion. Sinuses/Orbits: No acute finding. Postoperative  change about the right maxillary sinus. Mucosal thickening throughout the paranasal sinuses. No mastoid effusion. Other: None. IMPRESSION: 1. No acute intracranial abnormality. Electronically Signed   By: Minerva Fester M.D.   On: 10/24/2022 15:30      Assessment/Plan Principal Problem:   DKA (diabetic ketoacidosis) (HCC) Active Problems:   Diabetes mellitus without complication (HCC)   Fall at home, initial encounter   UTI (urinary  tract infection)   Hypertension   Hypercholesteremia   Cardiomyopathy (HCC)   Chronic systolic CHF (congestive heart failure) (HCC)   Elevated CK   Elevated lactic acid level   Assessment and Plan:   DKA (diabetic ketoacidosis) (HCC): Patient likely has mild DKA with anion gap of 18, bicarbonate 16, blood sugar 364, urine ketones 80, beta hydroxybutyric acid 5.23.  - Place in SDU for obs - IVF:  1L of NS and 1044 ml of LR in ED - start insulin drip with BMP q4h - IVF: LR at 75 cc/h, will switch to D5-LR at75 cc/h when CBG<250 - replete K as needed - Zofran prn nausea  - NPO  - consult to diabetic educator  Diabetes mellitus without complication [facility]): Recent A1c 14.6, poorly controlled.  Patient taking glipizide at home, now has DKA -On DKA protocol  Fall at home, initial encounter: -Fall precaution -PT/OT -Follow-up x-ray of both knees  UTI (urinary tract infection) -IV Rocephin -Follow-up urine culture  Hypertension -IV hydralazine as needed -Coreg  Hypercholesteremia: Patient is not taking Zetia currently -Follow-up with PCP  Cardiomyopathy (HCC) and hx of chronic systolic CHF (congestive heart failure) (HCC): 2D echo on 08/23/2016 showed EF of 50%.  Patient does not have leg edema or JVD.  CHF seem to be compensated. -Check BNP  Elevated CK: CK 310 -on IVF -repeat CK in AM  Elevated lactic acid level: Lactic acid 2.9, clinically not septic.  Likely due to dehydration -IV fluid as above -Trend lactic acid  level       DVT ppx: SQ Lovenox  Code Status: Full code  Family Communication: Yes, patient's son at bed side.      Disposition Plan:  Anticipate discharge back to previous environment  Consults called:  none  Admission status and Level of care: Stepdown:    for obs   Dispo: The patient is from: Home              Anticipated d/c is to: Home              Anticipated d/c date is: 1 day              Patient currently is not medically stable to d/c.    Severity of Illness:  The appropriate patient status for this patient is OBSERVATION. Observation status is judged to be reasonable and necessary in order to provide the required intensity of service to ensure the patient's safety. The patient's presenting symptoms, physical exam findings, and initial radiographic and laboratory data in the context of their medical condition is felt to place them at decreased risk for further clinical deterioration. Furthermore, it is anticipated that the patient will be medically stable for discharge from the hospital within 2 midnights of admission.        Date of Service 10/24/2022    Lorretta Harp [facility]   If 7PM-7AM, please contact night-coverage [email or web address] 10/24/2022, 5:49 PM

## 2022-10-24 NOTE — ED Notes (Signed)
Lactic redraw to be collected after fluid bolus

## 2022-10-24 NOTE — ED Notes (Signed)
First nurse note: From home [facility] for fall, son found on ground today. Pt says rolled out of bed, laid on floor for 3 days. Chronic generalized pain (hips, knees, arms, hands), negative stroke screen EMS  366 CBG, no meds since 3 days ST, 130, paced rhythm 20# L forearm, fluid given by EMS 157/96, last HR 112 after fluids  Pt currently alert and oriented. Not soiled

## 2022-10-24 NOTE — ED Triage Notes (Signed)
Refer to first nurse note 

## 2022-10-24 NOTE — ED Provider Notes (Signed)
Care assumed of patient from outgoing provider.  See their note for initial history, exam and plan.  Clinical Course as of 10/24/22 1623  Wed Oct 24, 2022  1509 Presented to the emergency department with unknown downtime, found down at her home for possibly 3 days.  Arrived tachycardic and appeared dehydrated.  Hyperglycemia but waiting on beta hydroxybutyrate and urine to determine if in DKA.  Mild leukocytosis.  No signs of rhabdomyolysis.  History of medication noncompliance.  Currently lives alone by herself and her son is here with her.  Pending CT scan and urine.  Plan to admit for dehydration [SM]    Clinical Course User Index [SM] Corena Herter, MD   Beta-hydroxybutyrate significantly elevated at 5.2.  Urine with large amount of ketones.  Does have moderate leukocytes and WBC.  Patient without any symptoms of burning with urination, urinary urgency or frequency.  Notified by nursing staff that patient had a run of possible V. tach.  Patient was asymptomatic during that time.  Lasted for approximately 3 seconds and spontaneously resolved.  Repeat EKG without change.  Patient without complaints of any chest pain.  Given another 1 L of IV fluids and started on insulin infusion.  Patient has been noncompliant with her glipizide.  .Critical Care  Performed by: Corena Herter, MD Authorized by: Corena Herter, MD   Critical care provider statement:    Critical care time (minutes):  30   Critical care time was exclusive of:  Separately billable procedures and treating other patients   Critical care was necessary to treat or prevent imminent or life-threatening deterioration of the following conditions:  Endocrine crisis   Critical care was time spent personally by me on the following activities:  Development of treatment plan with patient or surrogate, discussions with consultants, evaluation of patient's response to treatment, examination of patient, ordering and review of laboratory  studies, ordering and review of radiographic studies, ordering and performing treatments and interventions, pulse oximetry, re-evaluation of patient's condition and review of old charts     Corena Herter, MD 10/24/22 1623

## 2022-10-24 NOTE — ED Provider Notes (Signed)
[facility] Provider Note   Event Date/Time   First MD Initiated Contact with Patient 10/24/22 1314     (approximate) History  Fall  HPI Emma Guzman is a 77 y.o. female with a past medical history of type 2 diabetes and medication nonadherence who presents via EMS after being found down for unknown period of time.  Patient appears tachycardic and clinically dehydrated.  Patient is alert and oriented x 3 at this time.  Patient states that she was down for 4-5 days however caregiver at bedside states that he saw her 4 days ago to make up her bed and do her laundry where patient was acting appropriately.  Patient's caregiver at bedside is also concerned that she normally gets around with a rollator however when she was found her rollator was in a separate room which is very unlike her.  Patient denies crawling to try to get a phone or otherwise.  Patient was found soiled.  Patient denies any complaints or pain at this time ROS: Patient currently denies any vision changes, tinnitus, difficulty speaking, facial droop, sore throat, chest pain, shortness of breath, abdominal pain, nausea/vomiting/diarrhea, dysuria, or weakness/numbness/paresthesias in any extremity   Physical Exam  Triage Vital Signs: ED Triage Vitals  Encounter Vitals Group     BP 10/24/22 1222 (!) 142/79     Systolic BP Percentile --      Diastolic BP Percentile --      Pulse Rate 10/24/22 1222 (!) 118     Resp 10/24/22 1222 18     Temp 10/24/22 1222 98 F (36.7 C)     Temp Source 10/24/22 1222 Oral     SpO2 10/24/22 1222 96 %     Weight 10/24/22 1223 115 lb (52.2 kg)     Height 10/24/22 1223 5\' 1"  (1.549 m)     Head Circumference --      Peak Flow --      Pain Score 10/24/22 1223 0     Pain Loc --      Pain Education --      Exclude from Growth Chart --    Most recent vital signs: Vitals:   10/25/22 0900 10/25/22 1000  BP: 128/77 135/76  Pulse: 82 83  Resp: 18 14  Temp:    SpO2:  96% 98%   General: Awake, oriented x4. CV:  Good peripheral perfusion.  Resp:  Normal effort.  Abd:  No distention.  Other:  Elderly well-developed, well-nourished Caucasian female laying in bed in no acute distress.  Cracked lips and skin turgor ED Results / Procedures / Treatments  Labs (all labs ordered are listed, but only abnormal results are displayed) Labs Reviewed  CBC - Abnormal; Notable for the following components:      Result Value   WBC 11.0 (*)    All other components within normal limits  COMPREHENSIVE METABOLIC PANEL - Abnormal; Notable for the following components:   Sodium 134 (*)    CO2 16 (*)    Glucose, Bld 364 (*)    BUN 25 (*)    Total Bilirubin 2.2 (*)    Anion gap 18 (*)    All other components within normal limits  CK - Abnormal; Notable for the following components:   Total CK 310 (*)    All other components within normal limits  LACTIC ACID, PLASMA - Abnormal; Notable for the following components:   Lactic Acid, Venous 2.9 (*)    All other  components within normal limits  URINALYSIS, ROUTINE W REFLEX MICROSCOPIC - Abnormal; Notable for the following components:   Color, Urine YELLOW (*)    APPearance HAZY (*)    Glucose, UA >=500 (*)    Hgb urine dipstick SMALL (*)    Ketones, ur 80 (*)    Protein, ur 30 (*)    Leukocytes,Ua MODERATE (*)    Bacteria, UA RARE (*)    All other components within normal limits  BLOOD GAS, VENOUS - Abnormal; Notable for the following components:   pCO2, Ven 41 (*)    pO2, Ven 47 (*)    Acid-base deficit 5.4 (*)    All other components within normal limits  BETA-HYDROXYBUTYRIC ACID - Abnormal; Notable for the following components:   Beta-Hydroxybutyric Acid 5.23 (*)    All other components within normal limits  BASIC METABOLIC PANEL - Abnormal; Notable for the following components:   CO2 19 (*)    Glucose, Bld 299 (*)    BUN 25 (*)    All other components within normal limits  BASIC METABOLIC PANEL - Abnormal;  Notable for the following components:   Glucose, Bld 195 (*)    BUN 25 (*)    All other components within normal limits  BASIC METABOLIC PANEL - Abnormal; Notable for the following components:   Glucose, Bld 160 (*)    BUN 25 (*)    Calcium 8.8 (*)    All other components within normal limits  BASIC METABOLIC PANEL - Abnormal; Notable for the following components:   Potassium 3.1 (*)    Glucose, Bld 132 (*)    Calcium 8.8 (*)    Anion gap 3 (*)    All other components within normal limits  HEMOGLOBIN A1C - Abnormal; Notable for the following components:   Hgb A1c MFr Bld 11.6 (*)    All other components within normal limits  CBC - Abnormal; Notable for the following components:   RBC 3.74 (*)    Hemoglobin 11.2 (*)    HCT 31.8 (*)    All other components within normal limits  GLUCOSE, CAPILLARY - Abnormal; Notable for the following components:   Glucose-Capillary 278 (*)    All other components within normal limits  GLUCOSE, CAPILLARY - Abnormal; Notable for the following components:   Glucose-Capillary 223 (*)    All other components within normal limits  GLUCOSE, CAPILLARY - Abnormal; Notable for the following components:   Glucose-Capillary 179 (*)    All other components within normal limits  BASIC METABOLIC PANEL - Abnormal; Notable for the following components:   Potassium 3.1 (*)    Glucose, Bld 153 (*)    Calcium 8.8 (*)    All other components within normal limits  GLUCOSE, CAPILLARY - Abnormal; Notable for the following components:   Glucose-Capillary 184 (*)    All other components within normal limits  GLUCOSE, CAPILLARY - Abnormal; Notable for the following components:   Glucose-Capillary 160 (*)    All other components within normal limits  GLUCOSE, CAPILLARY - Abnormal; Notable for the following components:   Glucose-Capillary 132 (*)    All other components within normal limits  GLUCOSE, CAPILLARY - Abnormal; Notable for the following components:    Glucose-Capillary 146 (*)    All other components within normal limits  GLUCOSE, CAPILLARY - Abnormal; Notable for the following components:   Glucose-Capillary 167 (*)    All other components within normal limits  GLUCOSE, CAPILLARY - Abnormal; Notable for the  following components:   Glucose-Capillary 174 (*)    All other components within normal limits  GLUCOSE, CAPILLARY - Abnormal; Notable for the following components:   Glucose-Capillary 144 (*)    All other components within normal limits  GLUCOSE, CAPILLARY - Abnormal; Notable for the following components:   Glucose-Capillary 134 (*)    All other components within normal limits  CBG MONITORING, ED - Abnormal; Notable for the following components:   Glucose-Capillary 296 (*)    All other components within normal limits  MRSA NEXT GEN BY PCR, NASAL  URINE CULTURE  LACTIC ACID, PLASMA  BRAIN NATRIURETIC PEPTIDE  CK  LACTIC ACID, PLASMA  LACTIC ACID, PLASMA   EKG ED ECG REPORT I, Merwyn Katos, the attending physician, personally viewed and interpreted this ECG. Date: 10/24/2022 EKG Time: 1335 Rate: 109 Rhythm: Paced QRS Axis: normal Intervals: normal ST/T Wave abnormalities: normal Narrative Interpretation: Tachycardic paced rhythm.  No evidence of acute ischemia RADIOLOGY ED MD interpretation: Pending -Agree with radiology assessment Official radiology report(s): DG Chest Port 1 View  Result Date: 10/24/2022 CLINICAL DATA:  Syncope. EXAM: PORTABLE CHEST 1 VIEW COMPARISON:  September 28, 2020. FINDINGS: The heart size and mediastinal contours are within normal limits. Left-sided defibrillator is unchanged in position. Both lungs are clear. The visualized skeletal structures are unremarkable. IMPRESSION: No active disease. Electronically Signed   By: Lupita Raider M.D.   On: 10/24/2022 15:56   CT Head Wo Contrast  Result Date: 10/24/2022 CLINICAL DATA:  Mental status change EXAM: CT HEAD WITHOUT CONTRAST TECHNIQUE:  Contiguous axial images were obtained from the base of the skull through the vertex without intravenous contrast. RADIATION DOSE REDUCTION: This exam was performed according to the departmental dose-optimization program which includes automated exposure control, adjustment of the mA and/or kV according to patient size and/or use of iterative reconstruction technique. COMPARISON:  CT head 03/19/2017 FINDINGS: Brain: No intracranial hemorrhage, mass effect, or evidence of acute infarct. No hydrocephalus. No extra-axial fluid collection. Generalized cerebral atrophy. Ill-defined hypoattenuation within the cerebral white matter is nonspecific but consistent with chronic small vessel ischemic disease. Vascular: No hyperdense vessel. Intracranial arterial calcification. Skull: No fracture or focal lesion. Sinuses/Orbits: No acute finding. Postoperative change about the right maxillary sinus. Mucosal thickening throughout the paranasal sinuses. No mastoid effusion. Other: None. IMPRESSION: 1. No acute intracranial abnormality. Electronically Signed   By: Minerva Fester M.D.   On: 10/24/2022 15:30   PROCEDURES: Critical Care performed: No .1-3 Lead EKG Interpretation  Performed by: Merwyn Katos, MD Authorized by: Merwyn Katos, MD     Interpretation: abnormal     ECG rate:  107   ECG rate assessment: tachycardic     Rhythm: paced     Ectopy: none     Conduction: normal    MEDICATIONS ORDERED IN ED: Medications  dextrose 50 % solution 0-50 mL (has no administration in time range)  ondansetron (ZOFRAN) injection 4 mg (has no administration in time range)  hydrALAZINE (APRESOLINE) injection 5 mg (has no administration in time range)  acetaminophen (TYLENOL) tablet 650 mg (has no administration in time range)  enoxaparin (LOVENOX) injection 40 mg (40 mg Subcutaneous Given 10/24/22 1803)  cefTRIAXone (ROCEPHIN) 1 g in sodium chloride 0.9 % 100 mL IVPB (0 g Intravenous Stopped 10/24/22 1848)  carvedilol  (COREG) tablet 25 mg (25 mg Oral Given 10/25/22 0855)  pantoprazole (PROTONIX) EC tablet 40 mg (40 mg Oral Given 10/25/22 0950)  Oral care mouth rinse (has  no administration in time range)  Chlorhexidine Gluconate Cloth 2 % PADS 6 each (6 each Topical Given 10/25/22 0954)  insulin aspart (novoLOG) injection 0-20 Units (3 Units Subcutaneous Given 10/25/22 0855)  sodium chloride 0.9 % bolus 1,000 mL (0 mLs Intravenous Stopped 10/24/22 1644)  lactated ringers bolus 1,044 mL (0 mLs Intravenous Stopped 10/24/22 1800)  potassium chloride 10 mEq in 100 mL IVPB (10 mEq Intravenous New Bag/Given 10/24/22 1850)  sodium chloride 0.9 % bolus 500 mL ( Intravenous Stopped 10/24/22 2351)  insulin glargine-yfgn (SEMGLEE) injection 5 Units (5 Units Subcutaneous Given 10/24/22 2355)   IMPRESSION / MDM / ASSESSMENT AND PLAN / ED COURSE  I reviewed the triage vital signs and the nursing notes.                             The patient is on the cardiac monitor to evaluate for evidence of arrhythmia and/or significant heart rate changes. Patient's presentation is most consistent with acute presentation with potential threat to life or bodily function. Patient is 77 year old female with the above-stated past medical history presents via EMS after being found down for unknown period of time.  Patient is clinically dehydrated and will resuscitate with IV crystalloid.  Patient is alert and oriented x 3 at this time.  I have low suspicion for delirium.  Differential diagnosis also includes but is not limited to urinary tract infection, pneumonia, sepsis, intracranial hemorrhage, DKA, or intra-abdominal pathology.  Care of this patient will be signed out to the oncoming physician at the end of my shift.  All pertinent patient information conveyed and all questions answered.  All further care and disposition decisions will be made by the oncoming physician. Clinical Course as of 10/25/22 1111  Wed Oct 24, 2022  1509 Presented to the  emergency department with unknown downtime, found down at her home for possibly 3 days.  Arrived tachycardic and appeared dehydrated.  Hyperglycemia but waiting on beta hydroxybutyrate and urine to determine if in DKA.  Mild leukocytosis.  No signs of rhabdomyolysis.  History of medication noncompliance.  Currently lives alone by herself and her son is here with her.  Pending CT scan and urine.  Plan to admit for dehydration [SM]    Clinical Course User Index [SM] Corena Herter, MD   FINAL CLINICAL IMPRESSION(S) / ED DIAGNOSES   Final diagnoses:  None   Rx / DC Orders   ED Discharge Orders     None      Note:  This document was prepared using Dragon voice recognition software and may include unintentional dictation errors.   Merwyn Katos, MD 10/25/22 [phone]

## 2022-10-25 DIAGNOSIS — N189 Chronic kidney disease, unspecified: Secondary | ICD-10-CM | POA: Diagnosis present

## 2022-10-25 DIAGNOSIS — Z9842 Cataract extraction status, left eye: Secondary | ICD-10-CM | POA: Diagnosis not present

## 2022-10-25 DIAGNOSIS — E7800 Pure hypercholesterolemia, unspecified: Secondary | ICD-10-CM | POA: Diagnosis present

## 2022-10-25 DIAGNOSIS — Z950 Presence of cardiac pacemaker: Secondary | ICD-10-CM | POA: Diagnosis not present

## 2022-10-25 DIAGNOSIS — W06XXXA Fall from bed, initial encounter: Secondary | ICD-10-CM | POA: Diagnosis present

## 2022-10-25 DIAGNOSIS — N3001 Acute cystitis with hematuria: Secondary | ICD-10-CM | POA: Diagnosis not present

## 2022-10-25 DIAGNOSIS — W19XXXA Unspecified fall, initial encounter: Secondary | ICD-10-CM | POA: Diagnosis not present

## 2022-10-25 DIAGNOSIS — E860 Dehydration: Secondary | ICD-10-CM | POA: Diagnosis present

## 2022-10-25 DIAGNOSIS — I10 Essential (primary) hypertension: Secondary | ICD-10-CM

## 2022-10-25 DIAGNOSIS — Z961 Presence of intraocular lens: Secondary | ICD-10-CM | POA: Diagnosis present

## 2022-10-25 DIAGNOSIS — E119 Type 2 diabetes mellitus without complications: Secondary | ICD-10-CM

## 2022-10-25 DIAGNOSIS — K5900 Constipation, unspecified: Secondary | ICD-10-CM | POA: Diagnosis present

## 2022-10-25 DIAGNOSIS — N390 Urinary tract infection, site not specified: Secondary | ICD-10-CM | POA: Diagnosis present

## 2022-10-25 DIAGNOSIS — K219 Gastro-esophageal reflux disease without esophagitis: Secondary | ICD-10-CM | POA: Diagnosis present

## 2022-10-25 DIAGNOSIS — I5022 Chronic systolic (congestive) heart failure: Secondary | ICD-10-CM | POA: Diagnosis present

## 2022-10-25 DIAGNOSIS — Z7982 Long term (current) use of aspirin: Secondary | ICD-10-CM | POA: Diagnosis not present

## 2022-10-25 DIAGNOSIS — E1122 Type 2 diabetes mellitus with diabetic chronic kidney disease: Secondary | ICD-10-CM | POA: Diagnosis present

## 2022-10-25 DIAGNOSIS — Z79899 Other long term (current) drug therapy: Secondary | ICD-10-CM | POA: Diagnosis not present

## 2022-10-25 DIAGNOSIS — I130 Hypertensive heart and chronic kidney disease with heart failure and stage 1 through stage 4 chronic kidney disease, or unspecified chronic kidney disease: Secondary | ICD-10-CM | POA: Diagnosis present

## 2022-10-25 DIAGNOSIS — I43 Cardiomyopathy in diseases classified elsewhere: Secondary | ICD-10-CM | POA: Diagnosis present

## 2022-10-25 DIAGNOSIS — E1110 Type 2 diabetes mellitus with ketoacidosis without coma: Secondary | ICD-10-CM | POA: Diagnosis present

## 2022-10-25 DIAGNOSIS — E876 Hypokalemia: Secondary | ICD-10-CM | POA: Diagnosis not present

## 2022-10-25 DIAGNOSIS — Z7984 Long term (current) use of oral hypoglycemic drugs: Secondary | ICD-10-CM | POA: Diagnosis not present

## 2022-10-25 DIAGNOSIS — Y92009 Unspecified place in unspecified non-institutional (private) residence as the place of occurrence of the external cause: Secondary | ICD-10-CM | POA: Diagnosis not present

## 2022-10-25 DIAGNOSIS — Z91148 Patient's other noncompliance with medication regimen for other reason: Secondary | ICD-10-CM | POA: Diagnosis not present

## 2022-10-25 LAB — GLUCOSE, CAPILLARY
Glucose-Capillary: 134 mg/dL — ABNORMAL HIGH (ref 70–99)
Glucose-Capillary: 136 mg/dL — ABNORMAL HIGH (ref 70–99)
Glucose-Capillary: 144 mg/dL — ABNORMAL HIGH (ref 70–99)
Glucose-Capillary: 146 mg/dL — ABNORMAL HIGH (ref 70–99)
Glucose-Capillary: 167 mg/dL — ABNORMAL HIGH (ref 70–99)
Glucose-Capillary: 174 mg/dL — ABNORMAL HIGH (ref 70–99)
Glucose-Capillary: 195 mg/dL — ABNORMAL HIGH (ref 70–99)
Glucose-Capillary: 226 mg/dL — ABNORMAL HIGH (ref 70–99)

## 2022-10-25 LAB — BASIC METABOLIC PANEL
Anion gap: 8 (ref 5–15)
Anion gap: 8 (ref 5–15)
BUN: 23 mg/dL (ref 8–23)
BUN: 22 mg/dL (ref 8–23)
CO2: 22 mmol/L (ref 22–32)
CO2: 25 mmol/L (ref 22–32)
Calcium: 8.8 mg/dL — ABNORMAL LOW (ref 8.9–10.3)
Chloride: 106 mmol/L (ref 98–111)
Chloride: 107 mmol/L (ref 98–111)
Chloride: 107 mmol/L (ref 98–111)
Creatinine, Ser: 0.5 mg/dL (ref 0.44–1.00)
Creatinine, Ser: 0.51 mg/dL (ref 0.44–1.00)
Creatinine, Ser: 0.54 mg/dL (ref 0.44–1.00)
GFR, Estimated: >60 mL/min (ref >60)
GFR, Estimated: >60 mL/min (ref >60)
GFR, Estimated: >60 mL/min (ref >60)
Glucose, Bld: 132 mg/dL — ABNORMAL HIGH (ref 70–99)
Glucose, Bld: 153 mg/dL — ABNORMAL HIGH (ref 70–99)
Glucose, Bld: 160 mg/dL — ABNORMAL HIGH (ref 70–99)
Potassium: 3.1 mmol/L — ABNORMAL LOW (ref 3.5–5.1)
Potassium: 3.1 mmol/L — ABNORMAL LOW (ref 3.5–5.1)
Potassium: 3.5 mmol/L (ref 3.5–5.1)
Sodium: 135 mmol/L (ref 135–145)
Sodium: 136 mmol/L (ref 135–145)
Sodium: 138 mmol/L (ref 135–145)

## 2022-10-25 LAB — CBC
HCT: 31.8 % — ABNORMAL LOW (ref 36.0–46.0)
Hemoglobin: 11.2 g/dL — ABNORMAL LOW (ref 12.0–15.0)
MCH: 29.9 pg (ref 26.0–34.0)
MCHC: 35.2 g/dL (ref 30.0–36.0)
MCV: 85 fL (ref 80.0–100.0)
Platelets: 175 10*3/uL (ref 150–400)
RBC: 3.74 MIL/uL — ABNORMAL LOW (ref 3.87–5.11)
RDW: 12.3 % (ref 11.5–15.5)
WBC: 8 10*3/uL (ref 4.0–10.5)
nRBC: 0 % (ref 0.0–0.2)

## 2022-10-25 LAB — LACTIC ACID, PLASMA: Lactic Acid, Venous: 1.2 mmol/L (ref 0.5–1.9)

## 2022-10-25 LAB — CK: Total CK: 187 U/L (ref 38–234)

## 2022-10-25 MED ORDER — POTASSIUM CHLORIDE CRYS ER 20 MEQ PO TBCR
40.0000 meq | EXTENDED_RELEASE_TABLET | Freq: Once | ORAL | Status: AC
  Administered 2022-10-25: 40 meq via ORAL
  Filled 2022-10-25: qty 2

## 2022-10-25 MED ORDER — INSULIN ASPART 100 UNIT/ML IJ SOLN: 0.0000 [IU] | Freq: Three times a day (TID) | INTRAMUSCULAR | Status: DC

## 2022-10-25 MED ORDER — INSULIN ASPART 100 UNIT/ML IJ SOLN
0.0000 [IU] | Freq: Three times a day (TID) | INTRAMUSCULAR | Status: DC
  Administered 2022-10-25 – 2022-10-26 (×2): 3 [IU] via SUBCUTANEOUS
  Administered 2022-10-26: 5 [IU] via SUBCUTANEOUS
  Administered 2022-10-26: 8 [IU] via SUBCUTANEOUS
  Administered 2022-10-27: 3 [IU] via SUBCUTANEOUS
  Administered 2022-10-27: 2 [IU] via SUBCUTANEOUS
  Administered 2022-10-28: 5 [IU] via SUBCUTANEOUS
  Administered 2022-10-28 – 2022-10-29 (×3): 2 [IU] via SUBCUTANEOUS
  Filled 2022-10-25 (×10): qty 1

## 2022-10-25 MED ORDER — INSULIN ASPART 100 UNIT/ML IJ SOLN
0.0000 [IU] | Freq: Every day | INTRAMUSCULAR | Status: DC
  Administered 2022-10-25 – 2022-10-27 (×2): 2 [IU] via SUBCUTANEOUS
  Filled 2022-10-25 (×2): qty 1

## 2022-10-25 MED ORDER — INSULIN GLARGINE-YFGN 100 UNIT/ML ~~LOC~~ SOLN
8.0000 [IU] | Freq: Every day | SUBCUTANEOUS | Status: DC
  Administered 2022-10-25: 8 [IU] via SUBCUTANEOUS
  Filled 2022-10-25: qty 0.08

## 2022-10-25 NOTE — Progress Notes (Signed)
Progress Note   Patient: Emma Guzman HQI:[id number] DOB: 22-Mar-1946 DOA: 10/24/2022     0 DOS: the patient was seen and examined on 10/25/2022   Brief hospital course: Emma Guzman is a 77 y.o. female with medical history significant of HTN, HLD, DM, sCHF, AICD, GERD, HOH, LBBB, who presents with fall and weakness. Per his son at bedside, patient rolled out of the bed possibly in the Sunday night, found on the floor.  Patient states that she has been on the ground for several days.  She has generalized weakness, could not reach her roller. No head or neck injury.  She has pain in both knees, which is chronic issue.  Patient has increased urinary frequency, no dysuria or burning with urination. She was found to have WBC 11.0,, lactic acid 2.9, anion gap 18, bicarbonate 16, urine ketone 80, beta hydroxybutyric acid of 5.23, positive urinalysis (hazy appearance, moderate amount of leukocyte, rare bacteria, WBC> 50), CKD 310, temperature normal, blood pressure 141/101, heart rate 118 --> 89, RR 18, oxygen saturation 96% on room air.  VBG with pH 7.31, CO2 41, O2 47.  Chest x-ray negative.  CT head negative.  Patient is admitted to hospitalist service stepdown for observation for further evaluation and management of DKA, weakness.  Assessment and Plan: DKA (diabetic ketoacidosis) (HCC):  Her anion gap closed, bicarb improved. DKA resolved, last blood sugar 136. She is able to eat well. She got Semglee 5 units. Insulin drip transitioned to SQ achs. Will monitor blood sugars, hypoglycemia protocol.   Diabetes mellitus without complication [facility]): Recent A1c 14.6, poorly controlled.  Patient taking glipizide at home, presented with DKA. Diabetes educator on board.   Fall at home, initial encounter: Fall precaution PT/OT eval for safe dispo. Patient requesting different facility. Follow-up x-ray of both knees   UTI (urinary tract infection) Continue IV Rocephin Will follow-up urine  culture.  Hypokalemia- She got IV replacement with fluids. Oral replacements ordered.   Hypertension IV hydralazine as needed Continue Coreg   Hypercholesteremia: Patient is not taking Zetia currently Follow-up with PCP   Cardiomyopathy (HCC) and hx of chronic systolic CHF (congestive heart failure) (HCC): 2D echo on 08/23/2016 showed EF of 50%.  Patient does not have leg edema or JVD.  CHF seem to be compensated. BNP 41. No exacerbation noted.   Elevated CK: CK 310---> 187 stop IVF.   Elevated lactic acid level: Lactic acid 2.9, clinically not septic.  Likely due to dehydration. Lactic acid trended down.        Subjective: Patent is seen and examined today morning, She is sitting in chair, states she ate better. Has hard of hearing, no family at bedside. She is transitioned to SQ insulin.  Physical Exam: Vitals:   10/25/22 0900 10/25/22 1000 10/25/22 1100 10/25/22 1200  BP: 128/77 135/76 120/73 129/71  Pulse: 82 83 78 74  Resp: 18 14 13 19   Temp:    98.1 F (36.7 C)  TempSrc:      SpO2: 96% 98% 98% 95%  Weight:      Height:       General - Elderly Caucasian female, no apparent distress HEENT - PERRLA, EOMI, atraumatic head, hard of hearing. Lung - Clear, rales, rhonchi, wheezes. Heart - S1, S2 heard, no murmurs, rubs, 1+ pedal edema Neuro - Alert, awake and oriented x 3, non focal exam. Skin - Warm and dry. Data Reviewed:     Latest Ref Rng & Units 10/25/2022  8:09 AM 10/25/2022    6:27 AM 10/25/2022   12:36 AM  BMP  Glucose 70 - 99 mg/dL 578  469  629   BUN 8 - 23 mg/dL 22  23  25    Creatinine 0.44 - 1.00 mg/dL 5.28  4.13  2.44   Sodium 135 - 145 mmol/L 138  135  136   Potassium 3.5 - 5.1 mmol/L 3.1  3.1  3.5   Chloride 98 - 111 mmol/L 107  107  106   CO2 22 - 32 mmol/L 23  25  22    Calcium 8.9 - 10.3 mg/dL 8.8  8.8  8.8       Latest Ref Rng & Units 10/25/2022    6:27 AM 10/24/2022   12:26 PM 02/11/2015   12:45 PM  CBC  WBC 4.0 - 10.5 K/uL 8.0  11.0   6.9   Hemoglobin 12.0 - 15.0 g/dL 01.0  27.2  53.6   Hematocrit 36.0 - 46.0 % 31.8  43.7  37.9   Platelets 150 - 400 K/uL 175  204  169      Family Communication: Patient's son called, not connected.  Disposition: Status is: Inpatient Remains inpatient appropriate because: blood sugar control, IV antibiotics.  Planned Discharge Destination: Skilled nursing facility    Time spent: 44 minutes  Author: Marcelino Duster, MD 10/25/2022 1:48 PM  For on call review [email or web address].

## 2022-10-25 NOTE — Progress Notes (Addendum)
0800 Patient incontinent of urine first thing. Up to the bedside commode with help-patient stated she wanted to lay in bed all day. After BSC transferred to chair. Patient very annoyed with nurse because she was placed in chair. Bath given and patient statyed in chair until 1300. Helped  to [facility] x 3.with success. 1300 Back to bed with rolling walker. Encouraged to stand straight and take steps. Patient has a negative attitude.

## 2022-10-25 NOTE — Inpatient Diabetes Management (Signed)
Inpatient Diabetes Program Recommendations  AACE/ADA: New Consensus Statement on Inpatient Glycemic Control (2015)  Target Ranges:  Prepandial:   less than 140 mg/dL      Peak postprandial:   less than 180 mg/dL (1-2 hours)      Critically ill patients:  140 - 180 mg/dL   Lab Results  Component Value Date   GLUCAP 136 (H) 10/25/2022   HGBA1C 11.6 (H) 10/24/2022    Review of Glycemic Control  Latest Reference Range & Units 10/24/22 23:29 10/25/22 00:34 10/25/22 01:32 10/25/22 01:56 10/25/22 03:59 10/25/22 07:27 10/25/22 11:28  Glucose-Capillary 70 - 99 mg/dL 629 (H) 528 (H) 413 (H) 174 (H) 144 (H) 134 (H) 136 (H)   Diabetes history: DM 2 Outpatient Diabetes medications:  Glucotrol XL 10 mg bid Current orders for Inpatient glycemic control:  Novolog 0-20 units tid with meals and HS  Inpatient Diabetes Program Recommendations:    Spoke with patient at bedside.  She was sitting in chair and eating lunch. Discussed A1C of 11.6% with patient.  She states that her PCP has told her in the past that she may need insulin for control of blood sugars but she has been hesitant to do so.  Discussed that her A1C indicates average blood sugar of 287 mg/dL.  We discussed normal blood sugar values and importance of glycemic control.  Also discussed that high blood sugars can cause weakness, dehydration and feeling unwell.  Patient is open to being on insulin now but is unsure that she can do this on her own.  Note current plans are for her to d/c to SNF for rehab.  Will revisit patient tomorrow to further discuss insulin administration with she and her son.   - MD please consider adding Semglee 8 units q HS and reduce Novolog correction to moderate tid with meals and HS.    Thanks,  Beryl Meager, RN, BC-ADM Inpatient Diabetes Coordinator Pager [phone]  (8a-5p)

## 2022-10-25 NOTE — TOC Initial Note (Addendum)
Transition of Care [facility]) - Initial/Assessment Note    Patient Details  Name: Emma Guzman MRN: [id number] Date of Birth: Aug 01, 1945  [facility]) CM/SW Contact:    Kreg Shropshire, RN Phone Number: 10/25/2022, 11:11 AM  Clinical Narrative:                 Cm received consult for SNF placement. Pt AAOx2 and cm spoke with Vincent Peyer. Pt was living alone in apartment. DME included RW, grab bars, tub bench, and grab bars over toilet. Son lives nearby and would check on her prn. Pt has worked with PT/OT while inpt and they recommended SNF. Son and pt wants to think about SNF placement due to negative experiences with family members in the past. Son stated he would get back with me today about SNF decision for STR.  1153-Pt and family agreeable to SNF. Pt does not want to go to [facility] at this time. Cm started SNF work that includes getting PASSAR, FL2 and bed search. Cm awaiting to hear back from facilities.  1400-Cm spoke with pt about bed offers. She requested to go to [facility].  Cm will start authorization.  1458-Authorization pending   Barriers to Discharge: Barriers Resolved   Patient Goals and CMS Choice   CMS Medicare.gov Compare Post Acute Care list provided to:: Patient Choice offered to / list presented to : Patient, Adult Children      Expected Discharge Plan and Services       Living arrangements for the past 2 months: Apartment                                      Prior Living Arrangements/Services Living arrangements for the past 2 months: Apartment Lives with:: Self   Do you feel safe going back to the place where you live?: Yes      Need for Family Participation in Patient Care: Yes (Comment) Care giver support system in place?: Yes (comment) Current home services: DME    Activities of Daily Living      Permission Sought/Granted                  Emotional Assessment   Attitude/Demeanor/Rapport: Engaged Affect  (typically observed): Calm Orientation: : Oriented to Self, Oriented to Place, Oriented to  Time      Admission diagnosis:  DKA (diabetic ketoacidosis) (HCC) [E11.10] Patient Active Problem List   Diagnosis Date Noted   DKA (diabetic ketoacidosis) (HCC) 10/24/2022   Diabetes mellitus without complication (HCC) 10/24/2022   Elevated CK 10/24/2022   UTI (urinary tract infection) 10/24/2022   Elevated lactic acid level 10/24/2022   Fall at home, initial encounter 10/24/2022   Chronic systolic CHF (congestive heart failure) (HCC) 10/24/2022   Hypertension    Hypercholesteremia    Apnea, sleep 04/07/2014   Cardiac defibrillator in place 04/07/2014   Cardiomyopathy (HCC) 04/07/2014   Acid reflux 04/07/2014   Morbid obesity (HCC) 05/12/2008   CARDIOMYOPATHY, PRIMARY, DILATED 05/12/2008   LBBB 05/12/2008   SYSTOLIC HEART FAILURE, ACUTE ON CHRONIC 05/12/2008   AUTOMATIC IMPLANTABLE CARDIAC DEFIBRILLATOR SITU 05/12/2008   Automatic implantable cardioverter-defibrillator in situ 05/12/2008   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 [place] [address]  Phone: [phone] Fax: [phone]     Social Determinants of Health (SDOH) Social History: SDOH Screenings   Food Insecurity: No Food Insecurity (09/15/2021)   Received from [facility]  Transportation Needs: No Transportation Needs (09/15/2021)   Received from [facility]  Financial Resource Strain: Low Risk  (09/15/2021)   Received from [facility]  Physical Activity: Inactive (09/15/2021)   Received from [facility]  Social Connections: Moderately Integrated (09/15/2021)   Received from [facility]  Stress: Stress Concern Present (09/15/2021)   Received from [facility]   Tobacco Use: Low Risk  (10/24/2022)   SDOH Interventions:     Readmission Risk Interventions     No data to display

## 2022-10-25 NOTE — Evaluation (Signed)
Physical Therapy Evaluation Patient Details Name: Emma Guzman MRN: [id number] DOB: [birth year] Today's Date: 10/25/2022  History of Present Illness  Emma Guzman is a 77 y.o. female with medical history significant of HTN, HLD, DM, sCHF, AICD, GERD, HOH, LBBB, who presents with fall and weakness. On floor for unknown amount of time until son found her. imaging negative at this time.   Clinical Impression  Patient received in recliner. She is alert and pleasant. She is agreeable to PT/ OT assessment. Patient is able to stand from recliner with min +2 assist and step pivoted to [facility] and then back to recliner. Patient reports dizziness with standing and is unable to ambulate further at this time. Patient will continue to benefit from skilled PT to improve safety, strength and independence.         Assistance Recommended at Discharge Intermittent Supervision/Assistance  If plan is discharge home, recommend the following:  Can travel by private vehicle  A lot of help with walking and/or transfers;A little help with bathing/dressing/bathroom;Help with stairs or ramp for entrance;Direct supervision/assist for medications management;Assistance with cooking/housework;Assist for transportation   No    Equipment Recommendations None recommended by PT  Recommendations for Other Services       Functional Status Assessment Patient has had a recent decline in their functional status and demonstrates the ability to make significant improvements in function in a reasonable and predictable amount of time.     Precautions / Restrictions Precautions Precautions: Fall Restrictions Weight Bearing Restrictions: No      Mobility  Bed Mobility               General bed mobility comments: NT patient in recliner and remained in recliner    Transfers Overall transfer level: Needs assistance Equipment used: Rolling walker (2 wheels) Transfers: Sit to/from Stand, Bed to  chair/wheelchair/BSC Sit to Stand: Min assist, +2 physical assistance   Step pivot transfers: Min assist       General transfer comment: min A for walker management, cues for sequencing, reports feeling dizzy with upright mobility    Ambulation/Gait                  Stairs            Wheelchair Mobility     Tilt Bed    Modified Rankin (Stroke Patients Only)       Balance Overall balance assessment: Needs assistance Sitting-balance support: Feet supported Sitting balance-Leahy Scale: Good     Standing balance support: Bilateral upper extremity supported, During functional activity, Reliant on assistive device for balance Standing balance-Leahy Scale: Fair                               Pertinent Vitals/Pain Pain Assessment Pain Assessment: Faces Faces Pain Scale: Hurts a little bit Pain Location: all over Pain Descriptors / Indicators: Discomfort, Sore Pain Intervention(s): Monitored during session, Repositioned    Home Living Family/patient expects to be discharged to:: Private residence Living Arrangements: Alone Available Help at Discharge: Family;Available PRN/intermittently Type of Home: Apartment Home Access: Level entry       Home Layout: One level Home Equipment: Cane - single point;Rollator (4 wheels);Grab bars - tub/shower;Tub bench;Grab bars - toilet      Prior Function Prior Level of Function : Independent/Modified Independent               ADLs Comments: Assist from family with IALDs and  medication management     Hand Dominance        Extremity/Trunk Assessment   Upper Extremity Assessment Upper Extremity Assessment: Generalized weakness    Lower Extremity Assessment Lower Extremity Assessment: Generalized weakness    Cervical / Trunk Assessment Cervical / Trunk Assessment: Normal  Communication   Communication: No difficulties  Cognition Arousal/Alertness: Awake/alert Behavior During Therapy:  WFL for tasks assessed/performed Overall Cognitive Status: Within Functional Limits for tasks assessed                                          General Comments      Exercises     Assessment/Plan    PT Assessment Patient needs continued PT services  PT Problem List Decreased strength;Decreased activity tolerance;Decreased balance;Decreased mobility;Pain       PT Treatment Interventions DME instruction;Gait training;Functional mobility training;Therapeutic activities;Patient/family education;Therapeutic exercise    PT Goals (Current goals can be found in the Care Plan section)  Acute Rehab PT Goals Patient Stated Goal: to reduce pain PT Goal Formulation: With patient Time For Goal Achievement: 11/08/22 Potential to Achieve Goals: Good    Frequency Min 1X/week     Co-evaluation PT/OT/SLP Co-Evaluation/Treatment: Yes Reason for Co-Treatment: For patient/therapist safety;To address functional/ADL transfers PT goals addressed during session: Mobility/safety with mobility;Balance         AM-PAC PT "6 Clicks" Mobility  Outcome Measure Help needed turning from your back to your side while in a flat bed without using bedrails?: A Lot Help needed moving from lying on your back to sitting on the side of a flat bed without using bedrails?: A Lot Help needed moving to and from a bed to a chair (including a wheelchair)?: A Little Help needed standing up from a chair using your arms (e.g., wheelchair or bedside chair)?: A Little Help needed to walk in hospital room?: A Lot Help needed climbing 3-5 steps with a railing? : Total 6 Click Score: 13    End of Session   Activity Tolerance: Patient limited by fatigue;Patient limited by pain Patient left: in chair;with call bell/phone within reach Nurse Communication: Mobility status PT Visit Diagnosis: Other abnormalities of gait and mobility (R26.89);Difficulty in walking, not elsewhere classified (R26.2);Muscle  weakness (generalized) (M62.81);History of falling (Z91.81);Pain Pain - part of body:  (all over)    Time: 1027-2536 PT Time Calculation (min) (ACUTE ONLY): 23 min   Charges:   PT Evaluation $PT Eval Moderate Complexity: 1 Mod   PT General Charges $$ ACUTE PT VISIT: 1 Visit          , PT, GCS 10/25/22,10:30 AM

## 2022-10-25 NOTE — Evaluation (Signed)
Occupational Therapy Evaluation Patient Details Name: Emma Guzman MRN: [id number] DOB: 1945-11-04 Today's Date: 10/25/2022   History of Present Illness Emma Guzman is a 77 y.o. female with medical history significant of HTN, HLD, DM, sCHF, AICD, GERD, HOH, LBBB, who presents with fall and weakness. On floor for unknown amount of time until son found her. imaging negative at this time.   Clinical Impression   Pt was seen for OT evaluation this date. Prior to hospital admission, pt was MODI with ADLs and ambulating with a rollator. Pt lives alone in an level entry apartment. Pt presents to acute OT demonstrating impaired ADL performance and functional mobility 2/2 generalized weakness and decreased activity tolerance (See OT problem list for additional functional deficits). Pt currently requires MIN A for transfers with RW in place. Multimodal cueing provided for sequencing and RW management while transferring to [facility]. Pt reported generalized soreness and dizziness upon standing and mobility. Pt would benefit from skilled OT services to address noted impairments and functional limitations (see below for any additional details) in order to maximize safety and independence while minimizing falls risk and caregiver burden. Anticipate the need for follow up OT services upon acute hospital DC.       Recommendations for follow up therapy are one component of a multi-disciplinary discharge planning process, led by the attending physician.  Recommendations may be updated based on patient status, additional functional criteria and insurance authorization.   Assistance Recommended at Discharge Intermittent Supervision/Assistance  Patient can return home with the following A little help with walking and/or transfers;A little help with bathing/dressing/bathroom;Assistance with cooking/housework;Direct supervision/assist for medications management;Assist for transportation;Help with stairs or ramp for  entrance    Functional Status Assessment  Patient has had a recent decline in their functional status and demonstrates the ability to make significant improvements in function in a reasonable and predictable amount of time.  Equipment Recommendations  Other (comment) (Defer to next venue of care)    Recommendations for Other Services       Precautions / Restrictions Precautions Precautions: Fall Restrictions Weight Bearing Restrictions: No      Mobility Bed Mobility               General bed mobility comments: Not assessed - pt recieved and reterned to recliner    Transfers Overall transfer level: Needs assistance Equipment used: Rolling walker (2 wheels) Transfers: Bed to chair/wheelchair/BSC       Step pivot transfers: Min assist     General transfer comment: MIN A for RW management and VC for sequencing      Balance Overall balance assessment: Needs assistance Sitting-balance support: Feet supported Sitting balance-Leahy Scale: Good     Standing balance support: Bilateral upper extremity supported, Reliant on assistive device for balance Standing balance-Leahy Scale: Fair Standing balance comment: Pt reported feeling dizzy upon standing, held onto RW, but no signs of LOB                           ADL either performed or assessed with clinical judgement   ADL Overall ADL's : Needs assistance/impaired                         Toilet Transfer: Minimal assistance;Cueing for sequencing;Stand-pivot;BSC/3in1;Rolling walker (2 wheels)           Functional mobility during ADLs: Minimal assistance;Rolling walker (2 wheels) General ADL Comments: Supervision to MIN A to  complete ADLs is anticipated     Vision         Perception     Praxis      Pertinent Vitals/Pain Pain Assessment Pain Assessment: Faces Faces Pain Scale: Hurts a little bit Pain Location: General soreness Pain Descriptors / Indicators: Sore Pain  Intervention(s): Monitored during session, Repositioned     Hand Dominance     Extremity/Trunk Assessment Upper Extremity Assessment Upper Extremity Assessment: Generalized weakness   Lower Extremity Assessment Lower Extremity Assessment: Generalized weakness   Cervical / Trunk Assessment Cervical / Trunk Assessment: Normal   Communication Communication Communication: No difficulties   Cognition Arousal/Alertness: Awake/alert Behavior During Therapy: WFL for tasks assessed/performed Overall Cognitive Status: Within Functional Limits for tasks assessed                                 General Comments: Alert and oriented     General Comments       Exercises     Shoulder Instructions      Home Living Family/patient expects to be discharged to:: Private residence Living Arrangements: Alone Available Help at Discharge: Family;Available PRN/intermittently Type of Home: Apartment Home Access: Level entry     Home Layout: One level     Bathroom Shower/Tub: Chief Strategy Officer: Handicapped height Bathroom Accessibility: Yes How Accessible: Accessible via walker Home Equipment: Cane - single point;Rollator (4 wheels);Grab bars - tub/shower;Tub bench;Grab bars - toilet          Prior Functioning/Environment Prior Level of Function : Independent/Modified Independent               ADLs Comments: Assist from family with IALDs and medication management        OT Problem List: Decreased strength;Impaired balance (sitting and/or standing);Decreased activity tolerance      OT Treatment/Interventions: Self-care/ADL training;Therapeutic exercise;Energy conservation;DME and/or AE instruction;Therapeutic activities;Patient/family education    OT Goals(Current goals can be found in the care plan section) Acute Rehab OT Goals Patient Stated Goal: To feel better and go home OT Goal Formulation: With patient Time For Goal Achievement:  11/08/22 Potential to Achieve Goals: Fair ADL Goals Pt Will Perform Grooming: with modified independence;standing Pt Will Transfer to Toilet: with modified independence;ambulating Pt Will Perform Toileting - Clothing Manipulation and hygiene: with modified independence;sit to/from stand  OT Frequency: Min 1X/week    Co-evaluation PT/OT/SLP Co-Evaluation/Treatment: Yes Reason for Co-Treatment: For patient/therapist safety;To address functional/ADL transfers PT goals addressed during session: Mobility/safety with mobility;Balance OT goals addressed during session: ADL's and self-care;Proper use of Adaptive equipment and DME      AM-PAC OT "6 Clicks" Daily Activity     Outcome Measure Help from another person eating meals?: None Help from another person taking care of personal grooming?: A Little Help from another person toileting, which includes using toliet, bedpan, or urinal?: A Little Help from another person bathing (including washing, rinsing, drying)?: A Little Help from another person to put on and taking off regular upper body clothing?: A Little Help from another person to put on and taking off regular lower body clothing?: A Little 6 Click Score: 19   End of Session Equipment Utilized During Treatment: Rolling walker (2 wheels) Nurse Communication: Mobility status  Activity Tolerance: Patient tolerated treatment well Patient left: in chair;with call bell/phone within reach  OT Visit Diagnosis: Unsteadiness on feet (R26.81);History of falling (Z91.81)  Time: 1308-6578 OT Time Calculation (min): 21 min Charges:  OT General Charges $OT Visit: 1 Visit OT Evaluation $OT Eval Low Complexity: 1 Low [employer], OTS 10/25/2022, 11:11 AM

## 2022-10-25 NOTE — NC FL2 (Signed)
[place] MEDICAID FL2 LEVEL OF CARE FORM     IDENTIFICATION  Patient Name: Emma Guzman Birthdate: [birth year] Sex: female Admission Date (Current Location): 10/24/2022  [facility] and [place] Number:  Chiropodist and Address:  [facility], [address], [place], [place] [address]      Provider Number: [id number]  Attending Physician Name and Address:  Marcelino Duster, MD  Relative Name and Phone Number:  Vincent Peyer [phone]    Current Level of Care: Hospital Recommended Level of Care: Skilled Nursing Facility Prior Approval Number:    Date Approved/Denied:   PASRR Number: [id number]  Discharge Plan: SNF    Current Diagnoses: Patient Active Problem List   Diagnosis Date Noted   DKA (diabetic ketoacidosis) (HCC) 10/24/2022   Diabetes mellitus without complication (HCC) 10/24/2022   Elevated CK 10/24/2022   UTI (urinary tract infection) 10/24/2022   Elevated lactic acid level 10/24/2022   Fall at home, initial encounter 10/24/2022   Chronic systolic CHF (congestive heart failure) (HCC) 10/24/2022   Hypertension    Hypercholesteremia    Apnea, sleep 04/07/2014   Cardiac defibrillator in place 04/07/2014   Cardiomyopathy (HCC) 04/07/2014   Acid reflux 04/07/2014   Morbid obesity (HCC) 05/12/2008   CARDIOMYOPATHY, PRIMARY, DILATED 05/12/2008   LBBB 05/12/2008   SYSTOLIC HEART FAILURE, ACUTE ON CHRONIC 05/12/2008   AUTOMATIC IMPLANTABLE CARDIAC DEFIBRILLATOR SITU 05/12/2008   Automatic implantable cardioverter-defibrillator in situ 05/12/2008   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   Block, bundle branch, left 05/12/2008    Orientation RESPIRATION BLADDER Height & Weight     Self, Time, Situation  Normal Continent Weight: 52.2 kg Height:  5\' 1"  (154.9 cm)  BEHAVIORAL SYMPTOMS/MOOD NEUROLOGICAL BOWEL NUTRITION STATUS      Continent Diet (see d/c summary)  AMBULATORY STATUS COMMUNICATION OF NEEDS Skin   Limited  Assist Verbally Normal                       Personal Care Assistance Level of Assistance  Bathing, Feeding, Dressing Bathing Assistance: Limited assistance Feeding assistance: Independent Dressing Assistance: Limited assistance     Functional Limitations Info             SPECIAL CARE FACTORS FREQUENCY  PT (By licensed PT), OT (By licensed OT)     PT Frequency: x5 min weekly OT Frequency: x5 min weekly            Contractures Contractures Info: Present    Additional Factors Info  Allergies, Code Status Code Status Info: Full Allergies Info: NKDA           Current Medications (10/25/2022):  This is the current hospital active medication list Current Facility-Administered Medications  Medication Dose Route Frequency Provider Last Rate Last Admin   acetaminophen (TYLENOL) tablet 650 mg  650 mg Oral Q6H PRN Lorretta Harp, MD       carvedilol (COREG) tablet 25 mg  25 mg Oral BID WC Lorretta Harp, MD   25 mg at 10/25/22 0855   cefTRIAXone (ROCEPHIN) 1 g in sodium chloride 0.9 % 100 mL IVPB  1 g Intravenous Q24H Lorretta Harp, MD   Stopped at 10/24/22 1848   Chlorhexidine Gluconate Cloth 2 % PADS 6 each  6 each Topical Daily Lorretta Harp, MD   6 each at 10/25/22 0954   dextrose 50 % solution 0-50 mL  0-50 mL Intravenous PRN Corena Herter, MD       enoxaparin (LOVENOX) injection 40 mg  40 mg Subcutaneous Q24H Lorretta Harp, MD   40 mg at 10/24/22 1803   hydrALAZINE (APRESOLINE) injection 5 mg  5 mg Intravenous Q2H PRN Lorretta Harp, MD       insulin aspart (novoLOG) injection 0-20 Units  0-20 Units Subcutaneous Q4H Mansy, Jan A, MD   3 Units at 10/25/22 0855   ondansetron [facility]) injection 4 mg  4 mg Intravenous Q8H PRN Lorretta Harp, MD       Oral care mouth rinse  15 mL Mouth Rinse PRN Lorretta Harp, MD       pantoprazole (PROTONIX) EC tablet 40 mg  40 mg Oral Daily Lorretta Harp, MD   40 mg at 10/25/22 1610     Discharge Medications: Please see discharge summary for a list of discharge  medications.  Relevant Imaging Results:  Relevant Lab Results:   Additional Information [id number]  Kreg Shropshire, RN

## 2022-10-25 NOTE — Progress Notes (Signed)
2230- reached out to DR [facility] regarding patients lack of urine output and  Bp of 105/59.Bladder scan showing of Urine   bolus of N/S ordered and maintenance fluid of Lactated ringers with 5% dextrose increased from 2ml/hr to 113ml/HR. Orders to in and out patient.    2333  Reached out Dr Arville Care regarding Endo tools prompt to transition patient off of Endo tool  Transition orders ordered and Dr Arville Care instructed nurse to leave maintenance fluids lactated ringers with 5% dextrose. If the patients sugar reaches over 200 then we are to switch out the maintenance fluid to lactated ringers.

## 2022-10-26 DIAGNOSIS — E119 Type 2 diabetes mellitus without complications: Secondary | ICD-10-CM | POA: Diagnosis not present

## 2022-10-26 DIAGNOSIS — W19XXXA Unspecified fall, initial encounter: Secondary | ICD-10-CM | POA: Diagnosis not present

## 2022-10-26 DIAGNOSIS — N3001 Acute cystitis with hematuria: Secondary | ICD-10-CM | POA: Diagnosis not present

## 2022-10-26 DIAGNOSIS — E1110 Type 2 diabetes mellitus with ketoacidosis without coma: Secondary | ICD-10-CM | POA: Diagnosis not present

## 2022-10-26 LAB — GLUCOSE, CAPILLARY
Glucose-Capillary: 263 mg/dL — ABNORMAL HIGH (ref 70–99)
Glucose-Capillary: 196 mg/dL — ABNORMAL HIGH (ref 70–99)
Glucose-Capillary: 219 mg/dL — ABNORMAL HIGH (ref 70–99)
Glucose-Capillary: 149 mg/dL — ABNORMAL HIGH (ref 70–99)

## 2022-10-26 MED ORDER — INSULIN GLARGINE-YFGN 100 UNIT/ML ~~LOC~~ SOLN
10.0000 [IU] | Freq: Every day | SUBCUTANEOUS | Status: DC
  Filled 2022-10-26: qty 0.1

## 2022-10-26 MED ORDER — LIVING WELL WITH DIABETES BOOK
Freq: Once | Status: AC
  Filled 2022-10-26: qty 1

## 2022-10-26 MED ORDER — INSULIN GLARGINE-YFGN 100 UNIT/ML ~~LOC~~ SOLN
16.0000 [IU] | Freq: Every day | SUBCUTANEOUS | Status: DC
  Administered 2022-10-26 – 2022-10-27 (×2): 16 [IU] via SUBCUTANEOUS
  Filled 2022-10-26 (×2): qty 0.16

## 2022-10-26 MED ORDER — INSULIN ASPART 100 UNIT/ML IJ SOLN
3.0000 [IU] | Freq: Three times a day (TID) | INTRAMUSCULAR | Status: DC
  Administered 2022-10-26 – 2022-10-28 (×6): 3 [IU] via SUBCUTANEOUS
  Filled 2022-10-26 (×6): qty 1

## 2022-10-26 NOTE — Progress Notes (Addendum)
Progress Note   Patient: Emma Guzman IHK:[id number] DOB: 04/18/1945 DOA: 10/24/2022     1 DOS: the patient was seen and examined on 10/26/2022   Brief hospital course: Emma Guzman is a 77 y.o. female with medical history significant of HTN, HLD, DM, sCHF, AICD, GERD, HOH, LBBB, who presents with fall and weakness. Per his son at bedside, patient rolled out of the bed possibly in the Sunday night, found on the floor.  Patient states that she has been on the ground for several days.  She has generalized weakness, could not reach her roller. No head or neck injury.  She has pain in both knees, which is chronic issue.  Patient has increased urinary frequency, no dysuria or burning with urination. She was found to have WBC 11.0,, lactic acid 2.9, anion gap 18, bicarbonate 16, urine ketone 80, beta hydroxybutyric acid of 5.23, positive urinalysis (hazy appearance, moderate amount of leukocyte, rare bacteria, WBC> 50), CKD 310, temperature normal, blood pressure 141/101, heart rate 118 --> 89, RR 18, oxygen saturation 96% on room air.  VBG with pH 7.31, CO2 41, O2 47.  Chest x-ray negative.  CT head negative.  Patient is admitted to hospitalist service stepdown for observation for further evaluation and management of DKA, weakness.  Assessment and Plan: DKA (diabetic ketoacidosis) (HCC):  Her anion gap closed, bicarb improved. DKA resolved, last blood sugar around 200 Semglee increased to 16 u at bedtime, added premeal 3u tid. Insulin drip transitioned to SQ achs. Will monitor blood sugars, hypoglycemia protocol.   Diabetes mellitus without complication [facility]): Recent A1c 14.6, poorly controlled.  Patient taking glipizide at home, presented with DKA. Diabetes educator on board.   Fall at home, initial encounter: Fall precaution PT/OT eval for safe dispo. TOC working on Firefighter. Follow-up x-ray of both knees   UTI (urinary tract infection) Multiple species on culture. Continue IV  Rocephin total 3 days.  Hypokalemia- Oral replacements ordered. Repeat BMP tomorrow.   Hypertension IV hydralazine as needed Continue Coreg   Hypercholesteremia: Patient is not taking Zetia currently Follow-up with PCP   Cardiomyopathy (HCC) and hx of chronic systolic CHF (congestive heart failure) (HCC): 2D echo on 08/23/2016 showed EF of 50%.  Patient does not have leg edema or JVD.  CHF seem to be compensated. BNP 41. No exacerbation noted.   Elevated CK: CK 310---> 187 stopped IVF.   Elevated lactic acid level: Lactic acid 2.9, clinically not septic.  Likely due to dehydration. Lactic acid trended down.        Subjective: Patent is seen and examined today morning, She is sitting in chair, states she ate better. Has hard of hearing, no family at bedside. She is transitioned to SQ insulin.  Physical Exam: Vitals:   10/26/22 0600 10/26/22 0700 10/26/22 0800 10/26/22 0851  BP: 139/79 135/65 (!) 140/69 139/87  Pulse: 75 74 73 76  Resp: 13 14 15 18   Temp:   98.8 F (37.1 C) 98.4 F (36.9 C)  TempSrc:   Oral Oral  SpO2: 96% 95% 95% 95%  Weight:      Height:       General - Elderly Caucasian female, no apparent distress HEENT - PERRLA, EOMI, atraumatic head, hard of hearing. Lung - Clear, rales, rhonchi, wheezes. Heart - S1, S2 heard, no murmurs, rubs, 1+ pedal edema Neuro - Alert, awake and oriented x 3, non focal exam. Skin - Warm and dry. Data Reviewed:     Latest Ref Rng &  Units 10/25/2022    8:09 AM 10/25/2022    6:27 AM 10/25/2022   12:36 AM  BMP  Glucose 70 - 99 mg/dL 295  621  308   BUN 8 - 23 mg/dL 22  23  25    Creatinine 0.44 - 1.00 mg/dL 6.57  8.46  9.62   Sodium 135 - 145 mmol/L 138  135  136   Potassium 3.5 - 5.1 mmol/L 3.1  3.1  3.5   Chloride 98 - 111 mmol/L 107  107  106   CO2 22 - 32 mmol/L 23  25  22    Calcium 8.9 - 10.3 mg/dL 8.8  8.8  8.8       Latest Ref Rng & Units 10/25/2022    6:27 AM 10/24/2022   12:26 PM 02/11/2015   12:45 PM  CBC   WBC 4.0 - 10.5 K/uL 8.0  11.0  6.9   Hemoglobin 12.0 - 15.0 g/dL 95.2  84.1  32.4   Hematocrit 36.0 - 46.0 % 31.8  43.7  37.9   Platelets 150 - 400 K/uL 175  204  169      Family Communication: Patient and her son at bedside updated regarding current care plan  Disposition: Status is: Inpatient Remains inpatient appropriate because: blood sugar control, IV antibiotics.  Planned Discharge Destination: Skilled nursing facility, pending insurance auth.    Time spent: 44 minutes  Author: Marcelino Duster, MD 10/26/2022 1:01 PM  For on call review [email or web address].

## 2022-10-26 NOTE — TOC Progression Note (Signed)
Transition of Care [facility]) - Progression Note    Patient Details  Name: Emma Guzman MRN: [id number] Date of Birth: [birth year]  [facility] of [facility] CM/SW Contact  Kreg Shropshire, RN Phone Number: 10/26/2022, 8:44 AM  Clinical Narrative:    Cm checked authorization for SNF. As of 0844, authorization is still pending for short term rehab. Cm will continue to check throughout the day     Barriers to Discharge: Barriers Resolved  Expected Discharge Plan and Services       Living arrangements for the past 2 months: Apartment                                       Social Determinants of Health (SDOH) Interventions SDOH Screenings   Food Insecurity: No Food Insecurity (09/15/2021)   Received from [facility]  Transportation Needs: No Transportation Needs (09/15/2021)   Received from [facility]  Financial Resource Strain: Low Risk  (09/15/2021)   Received from [facility]  Physical Activity: Inactive (09/15/2021)   Received from [facility]  Social Connections: Moderately Integrated (09/15/2021)   Received from [facility]  Stress: Stress Concern Present (09/15/2021)   Received [facility]  Tobacco Use: Low Risk  (10/24/2022)    Readmission Risk Interventions     No data to display

## 2022-10-26 NOTE — Progress Notes (Signed)
Physical Therapy Treatment Patient Details Name: Emma Guzman MRN: [id number] DOB: 1945-06-09 Today's Date: 10/26/2022   History of Present Illness Emma Guzman is a 77 y.o. female with medical history significant of HTN, HLD, DM, sCHF, AICD, GERD, HOH, LBBB, who presents with fall and weakness. On floor for unknown amount of time until son found her. imaging negative at this time.    PT Comments  Patient received in bed, she initially does not want to get up. With encouragement from PT and son she is agreeable to try. Patient requires min A for bed mobility and sit to stand. She is fearful of falling and is able to side step at edge of bed ~3 feet and then step pivoted to recliner with min guard and RW. She will continue to benefit from skilled PT to improve strength and functional independence.       Assistance Recommended at Discharge Intermittent Supervision/Assistance  If plan is discharge home, recommend the following:  Can travel by private vehicle    A lot of help with walking and/or transfers;A little help with bathing/dressing/bathroom;Help with stairs or ramp for entrance;Direct supervision/assist for medications management;Assistance with cooking/housework;Assist for transportation   No  Equipment Recommendations  None recommended by PT    Recommendations for Other Services       Precautions / Restrictions Precautions Precautions: Fall Restrictions Weight Bearing Restrictions: No     Mobility  Bed Mobility Overal bed mobility: Needs Assistance Bed Mobility: Supine to Sit     Supine to sit: Min assist     General bed mobility comments: Patient requires min A to raise trunk to seated position and to get scooted out to edge of bed    Transfers Overall transfer level: Needs assistance Equipment used: Rolling walker (2 wheels) Transfers: Sit to/from Stand Sit to Stand: Min assist   Step pivot transfers: Min assist       General transfer comment:  min A to boost up to standing x 2 reps from bed    Ambulation/Gait Ambulation/Gait assistance: Min assist Gait Distance (Feet): 3 Feet Assistive device: Rolling walker (2 wheels) Gait Pattern/deviations: Step-to pattern Gait velocity: dec     General Gait Details: patient able to side step 3 feet along edge of bed and then step pivot to recliner. Patient is fearful of falling and requires encouragement.   Stairs             Wheelchair Mobility     Tilt Bed    Modified Rankin (Stroke Patients Only)       Balance Overall balance assessment: Needs assistance Sitting-balance support: Feet supported, Single extremity supported Sitting balance-Leahy Scale: Good Sitting balance - Comments: posterior lean Postural control: Posterior lean Standing balance support: Bilateral upper extremity supported, During functional activity, Reliant on assistive device for balance Standing balance-Leahy Scale: Fair Standing balance comment: Reports feeling dizzy with initial side stepping and required seated rest.                            Cognition Arousal/Alertness: Awake/alert Behavior During Therapy: WFL for tasks assessed/performed Overall Cognitive Status: Within Functional Limits for tasks assessed                                          Exercises      General Comments  Pertinent Vitals/Pain Pain Assessment Pain Assessment: Faces Faces Pain Scale: Hurts a little bit Pain Location: all over Pain Descriptors / Indicators: Discomfort, Sore Pain Intervention(s): Monitored during session, Repositioned, Patient requesting pain meds-RN notified    Home Living                          Prior Function            PT Goals (current goals can now be found in the care plan section) Acute Rehab PT Goals Patient Stated Goal: to reduce pain, move better PT Goal Formulation: With patient Time For Goal Achievement:  11/08/22 Potential to Achieve Goals: Good Progress towards PT goals: Progressing toward goals    Frequency    Min 1X/week      PT Plan Current plan remains appropriate    Co-evaluation              AM-PAC PT "6 Clicks" Mobility   Outcome Measure  Help needed turning from your back to your side while in a flat bed without using bedrails?: A Little Help needed moving from lying on your back to sitting on the side of a flat bed without using bedrails?: A Lot Help needed moving to and from a bed to a chair (including a wheelchair)?: A Little Help needed standing up from a chair using your arms (e.g., wheelchair or bedside chair)?: A Little Help needed to walk in hospital room?: A Lot Help needed climbing 3-5 steps with a railing? : Total 6 Click Score: 14    End of Session Equipment Utilized During Treatment: Gait belt Activity Tolerance: Patient tolerated treatment well Patient left: in chair;with call bell/phone within reach Nurse Communication: Mobility status;Patient requests pain meds PT Visit Diagnosis: Other abnormalities of gait and mobility (R26.89);Difficulty in walking, not elsewhere classified (R26.2);Muscle weakness (generalized) (M62.81);History of falling (Z91.81);Pain Pain - part of body:  (all over)     Time: 4540-9811 PT Time Calculation (min) (ACUTE ONLY): 25 min  Charges:    $Therapeutic Activity: 23-37 mins PT General Charges $$ ACUTE PT VISIT: 1 Visit                      , PT, GCS 10/26/22,4:13 PM

## 2022-10-26 NOTE — Inpatient Diabetes Management (Addendum)
Inpatient Diabetes Program Recommendations  AACE/ADA: New Consensus Statement on Inpatient Glycemic Control (2015)  Target Ranges:  Prepandial:   less than 140 mg/dL      [facility] postprandial:   less than 180 mg/dL (1-2 hours)      Critically ill patients:  140 - 180 mg/dL   Lab Results  Component Value Date   GLUCAP 263 (H) 10/26/2022   HGBA1C 11.6 (H) 10/24/2022    Review of Glycemic Control  Latest Reference Range & Units 10/25/22 03:59 10/25/22 07:27 10/25/22 11:28 10/25/22 15:38 10/25/22 20:56 10/26/22 07:15  Glucose-Capillary 70 - 99 mg/dL 811 (H) 914 (H) 782 (H) 195 (H) 226 (H) 263 (H)   Diabetes history: DM  Outpatient Diabetes medications:  Glucotrol XL 10 mg daily- NOT TAKING Current orders for Inpatient glycemic control:  Novolog 0-15 units tid with meals and HS Semglee 10 units q HS Novolog 3 units tid with meals   Inpatient Diabetes Program Recommendations:    Consider increasing Semglee to 16 units q HS.   Addendum 1600- Spoke with patient's son Caryn Bee by phone regarding patient's likely need for insulin at discharge.  Explained patient's current A1C and discussed what goal blood sugars would be for his mother (100-180 mg/dL).  Briefly discussed that she will likely require one shot a day of basal insulin and verbally explained use of insulin pen.  Also sent patient's son video on how to use insulin pen for future use (after d/c from SNF).  Will order living will with DM booklet and add education to d/c paperwork as well.  Encouraged close f/u with patient's PCP- Dr. Greggory Stallion.  We also reviewed signs symptoms and treatment of hypoglycemia. Patient's son verbalized understanding.    Thanks  Beryl Meager, RN, BC-ADM Inpatient Diabetes Coordinator Pager [phone]  (8a-5p)

## 2022-10-26 NOTE — TOC Progression Note (Addendum)
Transition of Care [facility]) - Progression Note    Patient Details  Name: RAZAN BELICH MRN: [id number] Date of Birth: 12/07/1945  [facility] CM/SW Contact  Liliana Cline, LCSW Phone Number: 10/26/2022, 12:41 PM  Clinical Narrative:    Berkley Harvey is still pending in the [employer] portal.   2:30- Berkley Harvey is still pending.   3:30- Berkley Harvey is still pending.    Barriers to Discharge: Barriers Resolved  Expected Discharge Plan and Services       Living arrangements for the past 2 months: Apartment                                       Social Determinants of Health (SDOH) Interventions SDOH Screenings   Food Insecurity: No Food Insecurity (09/15/2021)   Received from [facility]  Transportation Needs: No Transportation Needs (09/15/2021)   Received from [facility]  Financial Resource Strain: Low Risk  (09/15/2021)   Received from [facility]  Physical Activity: Inactive (09/15/2021)   Received from [facility]  Social Connections: Moderately Integrated (09/15/2021)   Received from [facility]  Stress: Stress Concern Present (09/15/2021)   Received from [facility]  Tobacco Use: Low Risk  (10/24/2022)    Readmission Risk Interventions     No data to display

## 2022-10-27 DIAGNOSIS — E1110 Type 2 diabetes mellitus with ketoacidosis without coma: Secondary | ICD-10-CM | POA: Diagnosis not present

## 2022-10-27 DIAGNOSIS — W19XXXA Unspecified fall, initial encounter: Secondary | ICD-10-CM | POA: Diagnosis not present

## 2022-10-27 DIAGNOSIS — E119 Type 2 diabetes mellitus without complications: Secondary | ICD-10-CM | POA: Diagnosis not present

## 2022-10-27 DIAGNOSIS — I10 Essential (primary) hypertension: Secondary | ICD-10-CM | POA: Diagnosis not present

## 2022-10-27 LAB — GLUCOSE, CAPILLARY
Glucose-Capillary: 175 mg/dL — ABNORMAL HIGH (ref 70–99)
Glucose-Capillary: 132 mg/dL — ABNORMAL HIGH (ref 70–99)
Glucose-Capillary: 116 mg/dL — ABNORMAL HIGH (ref 70–99)
Glucose-Capillary: 219 mg/dL — ABNORMAL HIGH (ref 70–99)

## 2022-10-27 NOTE — Plan of Care (Signed)

## 2022-10-27 NOTE — Progress Notes (Signed)
Progress Note   Patient: Emma Guzman LKG:[id number] DOB: 19-Dec-1945 DOA: 10/24/2022     2 DOS: the patient was seen and examined on 10/27/2022   Brief hospital course: Emma Guzman is a 77 y.o. female with medical history significant of HTN, HLD, DM, sCHF, AICD, GERD, HOH, LBBB, who presents with fall and weakness. Per his son at bedside, patient rolled out of the bed possibly in the Sunday night, found on the floor.  Patient states that she has been on the ground for several days.  She has generalized weakness, could not reach her roller. No head or neck injury.  She has pain in both knees, which is chronic issue.  Patient has increased urinary frequency, no dysuria or burning with urination. She was found to have WBC 11.0,, lactic acid 2.9, anion gap 18, bicarbonate 16, urine ketone 80, beta hydroxybutyric acid of 5.23, positive urinalysis (hazy appearance, moderate amount of leukocyte, rare bacteria, WBC> 50), CKD 310, temperature normal, blood pressure 141/101, heart rate 118 --> 89, RR 18, oxygen saturation 96% on room air.  VBG with pH 7.31, CO2 41, O2 47.  Chest x-ray negative.  CT head negative.  Patient is admitted to hospitalist service stepdown for observation for further evaluation and management of DKA, weakness.  Assessment and Plan: DKA (diabetic ketoacidosis) (HCC):  Her anion gap closed, bicarb improved. DKA resolved. Blood sugars improved. Continue Semglee 16 u at bedtime, premeal 3u tid. SSI SQ achs. Will monitor blood sugars, hypoglycemia protocol.   Diabetes mellitus without complication [facility]): Recent A1c 14.6, poorly controlled.  Patient taking glipizide at home, presented with DKA. Diabetes educator on board.  Continue Semglee, Premeal aspart, insulin sliding scale.   Fall at home, initial encounter: Fall precaution PT/OT eval for safe dispo. TOC working on Firefighter. Follow-up x-ray of both knees   UTI (urinary tract infection) Multiple species on  culture. Received IV Rocephin total 3 days.  Hypokalemia- Oral replacements ordered. Repeat BMP tomorrow.   Hypertension IV hydralazine as needed Continue Coreg   Hypercholesteremia: Patient is not taking Zetia currently Follow-up with PCP   Cardiomyopathy (HCC) and hx of chronic systolic CHF (congestive heart failure) (HCC): 2D echo on 08/23/2016 showed EF of 50%.  Patient does not have leg edema or JVD.  CHF seem to be compensated. BNP 41. No exacerbation noted.   Elevated CK: Improved   Elevated lactic acid level: Lactic acid trended down.        Subjective: Patent is seen and examined today morning, She is lying in bed, eating fair.  Blood sugar better.  Denies any other complaints.  Physical Exam: Vitals:   10/26/22 1954 10/27/22 0412 10/27/22 0824 10/27/22 1544  BP: 138/78 138/73 124/72 118/84  Pulse: 74 68 69 70  Resp: 18 18 20 20   Temp: 98.1 F (36.7 C) 98.8 F (37.1 C) 98.1 F (36.7 C) 97.8 F (36.6 C)  TempSrc: Oral Oral Oral Oral  SpO2: 100% 99% 97% 97%  Weight:      Height:       General - Elderly Caucasian female, no apparent distress HEENT - PERRLA, EOMI, atraumatic head, hard of hearing. Lung - Clear, rales, rhonchi, wheezes. Heart - S1, S2 heard, no murmurs, rubs, 1+ pedal edema Neuro - Alert, awake and oriented x 3, non focal exam. Skin - Warm and dry. Data Reviewed:     Latest Ref Rng & Units 10/25/2022    8:09 AM 10/25/2022    6:27 AM 10/25/2022  12:36 AM  BMP  Glucose 70 - 99 mg/dL 782  956  213   BUN 8 - 23 mg/dL 22  23  25    Creatinine 0.44 - 1.00 mg/dL 0.86  5.78  4.69   Sodium 135 - 145 mmol/L 138  135  136   Potassium 3.5 - 5.1 mmol/L 3.1  3.1  3.5   Chloride 98 - 111 mmol/L 107  107  106   CO2 22 - 32 mmol/L 23  25  22    Calcium 8.9 - 10.3 mg/dL 8.8  8.8  8.8       Latest Ref Rng & Units 10/25/2022    6:27 AM 10/24/2022   12:26 PM 02/11/2015   12:45 PM  CBC  WBC 4.0 - 10.5 K/uL 8.0  11.0  6.9   Hemoglobin 12.0 - 15.0 g/dL  62.9  52.8  41.3   Hematocrit 36.0 - 46.0 % 31.8  43.7  37.9   Platelets 150 - 400 K/uL 175  204  169      Family Communication: Patient and her son at bedside updated regarding current care plan  Disposition: Status is: Inpatient Remains inpatient appropriate because: Safe discharge plan  Planned Discharge Destination: Skilled nursing facility, pending insurance auth.    Time spent: 44 minutes  Author: Marcelino Duster, MD 10/27/2022 4:24 PM  For on call review [email or web address].

## 2022-10-27 NOTE — TOC Progression Note (Addendum)
Transition of Care [facility]) - Progression Note    Patient Details  Name: ADALEYA MCDANIELS MRN: [id number] Date of Birth: March 01, 1946  Transition of Care [facility]) CM/SW Contact  Liliana Cline, LCSW Phone Number: 10/27/2022, 9:28 AM  Clinical Narrative:    Insurance Berkley Harvey is still pending. Uploaded MD notes per [facility] request.   1:20- Auth is still pending at this time.   3:35- Berkley Harvey is still pending at this time.   4:35- Auth approved. Called Tammy at [facility] to see if patient can come today, she states their pharmacy has already closed for the day/weekend and it will have to be Monday. Updated MD.      Barriers to Discharge: Barriers Resolved  Expected Discharge Plan and Services       Living arrangements for the past 2 months: Apartment                                       Social Determinants of Health (SDOH) Interventions SDOH Screenings   Food Insecurity: No Food Insecurity (10/27/2022)  Housing: Patient Declined (10/27/2022)  Transportation Needs: No Transportation Needs (10/27/2022)  Utilities: Not At Risk (10/27/2022)  Financial Resource Strain: Low Risk  (09/15/2021)   Received from [facility]  Physical Activity: Inactive (09/15/2021)   Received [facility]  Social Connections: Moderately Integrated (09/15/2021)   [facility]  Stress: Stress Concern Present (09/15/2021)   Received from [facility]  Tobacco Use: Low Risk  (10/24/2022)    Readmission Risk Interventions     No data to display

## 2022-10-28 DIAGNOSIS — E119 Type 2 diabetes mellitus without complications: Secondary | ICD-10-CM | POA: Diagnosis not present

## 2022-10-28 DIAGNOSIS — W19XXXA Unspecified fall, initial encounter: Secondary | ICD-10-CM | POA: Diagnosis not present

## 2022-10-28 DIAGNOSIS — I10 Essential (primary) hypertension: Secondary | ICD-10-CM | POA: Diagnosis not present

## 2022-10-28 DIAGNOSIS — E1110 Type 2 diabetes mellitus with ketoacidosis without coma: Secondary | ICD-10-CM | POA: Diagnosis not present

## 2022-10-28 LAB — GLUCOSE, CAPILLARY
Glucose-Capillary: 247 mg/dL — ABNORMAL HIGH (ref 70–99)
Glucose-Capillary: 88 mg/dL (ref 70–99)
Glucose-Capillary: 141 mg/dL — ABNORMAL HIGH (ref 70–99)
Glucose-Capillary: 196 mg/dL — ABNORMAL HIGH (ref 70–99)

## 2022-10-28 MED ORDER — INSULIN GLARGINE-YFGN 100 UNIT/ML ~~LOC~~ SOLN
18.0000 [IU] | Freq: Every day | SUBCUTANEOUS | Status: DC
  Administered 2022-10-28: 18 [IU] via SUBCUTANEOUS
  Filled 2022-10-28 (×2): qty 0.18

## 2022-10-28 MED ORDER — CYCLOBENZAPRINE HCL 10 MG PO TABS
5.0000 mg | ORAL_TABLET | Freq: Three times a day (TID) | ORAL | Status: DC | PRN
  Administered 2022-10-28: 5 mg via ORAL
  Filled 2022-10-28: qty 0.5

## 2022-10-28 MED ORDER — INSULIN ASPART 100 UNIT/ML IJ SOLN
5.0000 [IU] | Freq: Three times a day (TID) | INTRAMUSCULAR | Status: DC
  Administered 2022-10-28: 5 [IU] via SUBCUTANEOUS
  Filled 2022-10-28: qty 1

## 2022-10-28 MED ORDER — INSULIN ASPART 100 UNIT/ML IJ SOLN
3.0000 [IU] | Freq: Three times a day (TID) | INTRAMUSCULAR | Status: DC
  Administered 2022-10-28 – 2022-10-29 (×4): 3 [IU] via SUBCUTANEOUS
  Filled 2022-10-28 (×4): qty 1

## 2022-10-28 NOTE — Progress Notes (Signed)
Progress Note   Patient: Emma Guzman AOZ:[id number] DOB: 05-18-1945 DOA: 10/24/2022     3 DOS: the patient was seen and examined on 10/28/2022   Brief hospital course: TWALA OHLIN is a 77 y.o. female with medical history significant of HTN, HLD, DM, sCHF, AICD, GERD, HOH, LBBB, who presents with fall and weakness. Per his son at bedside, patient rolled out of the bed possibly in the Sunday night, found on the floor.  Patient states that she has been on the ground for several days.  She has generalized weakness, could not reach her roller. No head or neck injury.  She has pain in both knees, which is chronic issue.  Patient has increased urinary frequency, no dysuria or burning with urination. She was found to have WBC 11.0,, lactic acid 2.9, anion gap 18, bicarbonate 16, urine ketone 80, beta hydroxybutyric acid of 5.23, positive urinalysis (hazy appearance, moderate amount of leukocyte, rare bacteria, WBC> 50), CKD 310, temperature normal, blood pressure 141/101, heart rate 118 --> 89, RR 18, oxygen saturation 96% on room air.  VBG with pH 7.31, CO2 41, O2 47.  Chest x-ray negative.  CT head negative.  Patient is admitted to hospitalist service stepdown for observation for further evaluation and management of DKA, weakness.  Assessment and Plan: DKA (diabetic ketoacidosis) (HCC):  Diabetes mellitus without complication (HCC): DKA resolved. Recent A1c 14.6, poorly controlled.  Blood sugars above 200 Increased Semglee to 18 u at bedtime. SSI SQ achs. Will monitor blood sugars, hypoglycemia protocol.   Fall at home, initial encounter: Fall precaution PT/OT eval for safe dispo. Facility can take her on Monday. Follow-up x-ray of both knees   UTI (urinary tract infection) Multiple species on culture. Received IV Rocephin total 3 days.  Hypokalemia- Oral replacements ordered. Repeat BMP tomorrow.   Hypertension IV hydralazine as needed Continue Coreg   Hypercholesteremia:  Patient is not taking Zetia currently Follow-up with PCP   Cardiomyopathy (HCC) and hx of chronic systolic CHF (congestive heart failure) (HCC): 2D echo on 08/23/2016 showed EF of 50%.  Patient does not have leg edema or JVD.  CHF seem to be compensated. BNP 41. No exacerbation noted.   Elevated CK: Improved   Elevated lactic acid level: Lactic acid trended down.        Subjective: Patent is seen and examined today morning, She is lying in bed, eating fair.  Blood sugar better.  Denies any other complaints.  Physical Exam: Vitals:   10/27/22 1544 10/27/22 1945 10/28/22 0523 10/28/22 0756  BP: 118/84 112/72 132/80 (!) 142/82  Pulse: 70 73 76 76  Resp: 20 16 16 16   Temp: 97.8 F (36.6 C) 98.5 F (36.9 C) 98.5 F (36.9 C) 98.3 F (36.8 C)  TempSrc: Oral Oral Oral Oral  SpO2: 97% 99% 98% 97%  Weight:      Height:       General - Elderly Caucasian female, no apparent distress HEENT - PERRLA, EOMI, atraumatic head, hard of hearing. Lung - Clear, rales, rhonchi, wheezes. Heart - S1, S2 heard, no murmurs, rubs, trace pedal edema Neuro - Alert, awake and oriented x 3, non focal exam. Skin - Warm and dry. Data Reviewed:     Latest Ref Rng & Units 10/25/2022    8:09 AM 10/25/2022    6:27 AM 10/25/2022   12:36 AM  BMP  Glucose 70 - 99 mg/dL 962  952  841   BUN 8 - 23 mg/dL 22  23  25   Creatinine 0.44 - 1.00 mg/dL 2.95  6.21  3.08   Sodium 135 - 145 mmol/L 138  135  136   Potassium 3.5 - 5.1 mmol/L 3.1  3.1  3.5   Chloride 98 - 111 mmol/L 107  107  106   CO2 22 - 32 mmol/L 23  25  22    Calcium 8.9 - 10.3 mg/dL 8.8  8.8  8.8       Latest Ref Rng & Units 10/25/2022    6:27 AM 10/24/2022   12:26 PM 02/11/2015   12:45 PM  CBC  WBC 4.0 - 10.5 K/uL 8.0  11.0  6.9   Hemoglobin 12.0 - 15.0 g/dL 65.7  84.6  96.2   Hematocrit 36.0 - 46.0 % 31.8  43.7  37.9   Platelets 150 - 400 K/uL 175  204  169      Family Communication: Patient and her son at bedside updated regarding  current care plan  Disposition: Status is: Inpatient Remains inpatient appropriate because: Safe discharge plan, plan to dc on monday  Planned Discharge Destination: Skilled nursing facility.    Time spent: 42 minutes  Author: Marcelino Duster, MD 10/28/2022 2:19 PM  For on call review [email or web address].

## 2022-10-28 NOTE — Plan of Care (Signed)

## 2022-10-29 DIAGNOSIS — I10 Essential (primary) hypertension: Secondary | ICD-10-CM | POA: Diagnosis not present

## 2022-10-29 DIAGNOSIS — E119 Type 2 diabetes mellitus without complications: Secondary | ICD-10-CM | POA: Diagnosis not present

## 2022-10-29 DIAGNOSIS — E1110 Type 2 diabetes mellitus with ketoacidosis without coma: Secondary | ICD-10-CM | POA: Diagnosis not present

## 2022-10-29 DIAGNOSIS — W19XXXA Unspecified fall, initial encounter: Secondary | ICD-10-CM | POA: Diagnosis not present

## 2022-10-29 LAB — GLUCOSE, CAPILLARY
Glucose-Capillary: 121 mg/dL — ABNORMAL HIGH (ref 70–99)
Glucose-Capillary: 83 mg/dL (ref 70–99)
Glucose-Capillary: 136 mg/dL — ABNORMAL HIGH (ref 70–99)

## 2022-10-29 MED ORDER — DOCUSATE SODIUM 100 MG PO CAPS
100.0000 mg | ORAL_CAPSULE | Freq: Two times a day (BID) | ORAL | Status: DC
  Administered 2022-10-29: 100 mg via ORAL
  Filled 2022-10-29: qty 1

## 2022-10-29 MED ORDER — INSULIN ASPART 100 UNIT/ML IJ SOLN: 0.0000 [IU] | Freq: Three times a day (TID) | INTRAMUSCULAR | 2 refills | Status: DC

## 2022-10-29 MED ORDER — BISACODYL 10 MG RE SUPP
10.0000 mg | Freq: Once | RECTAL | Status: AC
  Administered 2022-10-29: 10 mg via RECTAL
  Filled 2022-10-29: qty 1

## 2022-10-29 MED ORDER — LISINOPRIL 10 MG PO TABS: 10.0000 mg | ORAL_TABLET | Freq: Every day | ORAL | 1 refills | Status: DC

## 2022-10-29 MED ORDER — INSULIN GLARGINE-YFGN 100 UNIT/ML ~~LOC~~ SOLN: 18.0000 [IU] | Freq: Every day | SUBCUTANEOUS | 2 refills | Status: DC

## 2022-10-29 MED ORDER — POLYETHYLENE GLYCOL 3350 17 G PO PACK: 17.0000 g | PACK | Freq: Every day | ORAL | 0 refills | Status: DC

## 2022-10-29 MED ORDER — DOCUSATE SODIUM 100 MG PO CAPS: 100.0000 mg | ORAL_CAPSULE | Freq: Two times a day (BID) | ORAL | 0 refills | Status: DC

## 2022-10-29 MED ORDER — INSULIN ASPART 100 UNIT/ML IJ SOLN: 3.0000 [IU] | Freq: Three times a day (TID) | INTRAMUSCULAR | 2 refills | Status: DC

## 2022-10-29 MED ORDER — SENNA 8.6 MG PO TABS: 1.0000 | ORAL_TABLET | Freq: Every day | ORAL | Status: DC

## 2022-10-29 MED ORDER — POLYETHYLENE GLYCOL 3350 17 G PO PACK
17.0000 g | PACK | Freq: Every day | ORAL | Status: DC
  Administered 2022-10-29: 17 g via ORAL
  Filled 2022-10-29: qty 1

## 2022-10-29 MED ORDER — SENNA 8.6 MG PO TABS: 1.0000 | ORAL_TABLET | Freq: Every day | ORAL | 0 refills | Status: DC

## 2022-10-29 NOTE — Care Management Important Message (Signed)
Important Message  Patient Details  Name: Emma Guzman MRN: [id number] Date of Birth: 05-29-1945   Medicare Important Message Given:  Yes     Bernadette Hoit 10/29/2022, 3:29 PM

## 2022-10-29 NOTE — Plan of Care (Signed)
Problem: Education: Goal: Ability to describe self-care measures that may prevent or decrease complications (Diabetes Survival Skills Education) will improve Outcome: Adequate for Discharge Goal: Individualized Educational Video(s) Outcome: Adequate for Discharge   Problem: Coping: Goal: Ability to adjust to condition or change in health will improve Outcome: Adequate for Discharge   Problem: Fluid Volume: Goal: Ability to maintain a balanced intake and output will improve Outcome: Adequate for Discharge   Problem: Health Behavior/Discharge Planning: Goal: Ability to identify and utilize available resources and services will improve Outcome: Adequate for Discharge Goal: Ability to manage health-related needs will improve Outcome: Adequate for Discharge   Problem: Metabolic: Goal: Ability to maintain appropriate glucose levels will improve Outcome: Adequate for Discharge   Problem: Nutritional: Goal: Maintenance of adequate nutrition will improve Outcome: Adequate for Discharge Goal: Progress toward achieving an optimal weight will improve Outcome: Adequate for Discharge   Problem: Skin Integrity: Goal: Risk for impaired skin integrity will decrease Outcome: Adequate for Discharge   Problem: Tissue Perfusion: Goal: Adequacy of tissue perfusion will improve Outcome: Adequate for Discharge   Problem: Education: Goal: Ability to describe self-care measures that may prevent or decrease complications (Diabetes Survival Skills Education) will improve Outcome: Adequate for Discharge Goal: Individualized Educational Video(s) Outcome: Adequate for Discharge   Problem: Cardiac: Goal: Ability to maintain an adequate cardiac output will improve Outcome: Adequate for Discharge   Problem: Health Behavior/Discharge Planning: Goal: Ability to identify and utilize available resources and services will improve Outcome: Adequate for Discharge Goal: Ability to manage health-related  needs will improve Outcome: Adequate for Discharge   Problem: Fluid Volume: Goal: Ability to achieve a balanced intake and output will improve Outcome: Adequate for Discharge   Problem: Metabolic: Goal: Ability to maintain appropriate glucose levels will improve Outcome: Adequate for Discharge   Problem: Nutritional: Goal: Maintenance of adequate nutrition will improve Outcome: Adequate for Discharge Goal: Maintenance of adequate weight for body size and type will improve Outcome: Adequate for Discharge   Problem: Respiratory: Goal: Will regain and/or maintain adequate ventilation Outcome: Adequate for Discharge   Problem: Urinary Elimination: Goal: Ability to achieve and maintain adequate renal perfusion and functioning will improve Outcome: Adequate for Discharge   Problem: Education: Goal: Knowledge of General Education information will improve Description: Including pain rating scale, medication(s)/side effects and non-pharmacologic comfort measures Outcome: Adequate for Discharge   Problem: Health Behavior/Discharge Planning: Goal: Ability to manage health-related needs will improve Outcome: Adequate for Discharge   Problem: Clinical Measurements: Goal: Ability to maintain clinical measurements within normal limits will improve Outcome: Adequate for Discharge Goal: Will remain free from infection Outcome: Adequate for Discharge Goal: Diagnostic test results will improve Outcome: Adequate for Discharge Goal: Respiratory complications will improve Outcome: Adequate for Discharge Goal: Cardiovascular complication will be avoided Outcome: Adequate for Discharge   Problem: Activity: Goal: Risk for activity intolerance will decrease Outcome: Adequate for Discharge   Problem: Nutrition: Goal: Adequate nutrition will be maintained Outcome: Adequate for Discharge   Problem: Coping: Goal: Level of anxiety will decrease Outcome: Adequate for Discharge   Problem:  Elimination: Goal: Will not experience complications related to bowel motility Outcome: Adequate for Discharge Goal: Will not experience complications related to urinary retention Outcome: Adequate for Discharge   Problem: Pain Managment: Goal: General experience of comfort will improve Outcome: Adequate for Discharge   Problem: Safety: Goal: Ability to remain free from injury will improve Outcome: Adequate for Discharge   Problem: Skin Integrity: Goal: Risk for impaired skin integrity will decrease  Outcome: Adequate for Discharge    Pt aox4, respirations even and unlabored on RA. Pt has all belongings. Pt being taken to facility by transport team. EMS transport team has received all paperwork. Pt report called to [employer] [facility]

## 2022-10-29 NOTE — TOC Transition Note (Signed)
Transition of Care [facility]) - CM/SW Discharge Note   Patient Details  Name: Emma Guzman MRN: [id number] Date of Birth: Jul 07, 1945  Transition of Care [facility]) CM/SW Contact:  Margarito Liner, LCSW Phone Number: 10/29/2022, 4:19 PM   Clinical Narrative:   Patient has orders to discharge to [facility] today. RN will call report to [phone] (Room 701P). RN will call EMS once she has had a BM. No further concerns. CSW signing off.  Final next level of care: Skilled Nursing Facility Barriers to Discharge: Other (must enter comment) (Bowel movement)   Patient Goals and CMS Choice CMS Medicare.gov Compare Post Acute Care list provided to:: Patient Choice offered to / list presented to : Patient, Adult Children  Discharge Placement PASRR number recieved: 10/25/22 PASRR number recieved: 10/25/22            Patient chooses bed at: [facility] [place] Patient to be transferred to facility by: EMS Name of family member notified: Taisia Robitaille Patient and family notified of of transfer: 10/29/22  Discharge Plan and Services Additional resources added to the After Visit Summary for                                       Social Determinants of Health (SDOH) Interventions SDOH Screenings   Food Insecurity: No Food Insecurity (10/27/2022)  Housing: Patient Declined (10/27/2022)  Transportation Needs: No Transportation Needs (10/27/2022)  Utilities: Not At Risk (10/27/2022)  Financial Resource Strain: Low Risk  (09/15/2021)   Received from [facility]  Physical Activity: Inactive (09/15/2021)   Received from [facility]  Social Connections: Moderately Integrated (09/15/2021)   Received from [facility]  Stress: Stress Concern Present (09/15/2021)   Received from [facility]  Tobacco Use: Low Risk  (10/24/2022)     Readmission Risk Interventions     No data to display

## 2022-10-29 NOTE — Discharge Summary (Signed)
Physician Discharge Summary   Patient: Emma Guzman MRN: [id number] DOB: 03-07-1946  Admit date:     10/24/2022  Discharge date: 10/29/22  Discharge Physician: Marcelino Duster   PCP: Rayetta Humphrey, MD   Recommendations at discharge:    PCP follow up in 1 week Started on insulin regimen, follow blood sugars to adjust insulin.  Discharge Diagnoses: Principal Problem:   DKA (diabetic ketoacidosis) (HCC) Active Problems:   Diabetes mellitus without complication (HCC)   Fall at home, initial encounter   UTI (urinary tract infection)   Hypertension   Hypercholesteremia   Cardiomyopathy (HCC)   Chronic systolic CHF (congestive heart failure) (HCC)   Elevated CK   Elevated lactic acid level  Resolved Problems:   * No resolved hospital problems. *  Hospital Course: Emma Guzman is a 77 y.o. female with medical history significant of HTN, HLD, DM, sCHF, AICD, GERD, HOH, LBBB, who presents with fall and weakness. Per his son at bedside, patient rolled out of the bed possibly in the "Sunday night, found on the floor.  Patient states that she has been on the ground for several days.  She has generalized weakness, could not reach her roller. No head or neck injury.  She has pain in both knees, which is chronic issue.  Patient has increased urinary frequency, no dysuria or burning with urination. She was found to have WBC 11.0,, lactic acid 2.9, anion gap 18, bicarbonate 16, urine ketone 80, beta hydroxybutyric acid of 5.23, positive urinalysis (hazy appearance, moderate amount of leukocyte, rare bacteria, WBC> 50), CKD 310, temperature normal, blood pressure 141/101, heart rate 118 --> 89, RR 18, oxygen saturation 96% on room air.  VBG with pH 7.31, CO2 41, O2 47.  Chest x-ray negative.  CT head negative.  Patient is admitted to hospitalist service stepdown for further evaluation and management of DKA, weakness.  Patient is started on insulin drip, IV hydration with gradual  improvement of blood sugars.  Patient's insulin drip transitioned to subcu.  A1c 11.6, started on long-acting insulin, Premeal insulin, sliding scale which is adjusted according to blood sugars.  Patient's urine cultures unremarkable, stopped antibiotics.  Patient's electrolytes closely monitored and replaced accordingly.  Patient complained of constipation, started on Colace senna MiraLAX.  Patient evaluated by PT OT who recommended SNF.  She remains hemodynamically stable to be discharged to skilled nursing facility.  I advised her to follow-up with PCP upon discharge as instructed.  She understands and agrees with the discharge plan.        Consultants: none Procedures performed: none  Disposition: Skilled nursing facility Diet recommendation:  Discharge Diet Orders (From admission, onward)     Start     Ordered   10/29/22 0000  Diet - low sodium heart healthy        10/29/22 1056   10/29/22 0000  Diet Carb Modified        07" /29/24 1056           Cardiac and Carb modified diet DISCHARGE MEDICATION: Allergies as of 10/29/2022   No Known Allergies      Medication List     STOP taking these medications    aspirin 81 MG tablet   ezetimibe 10 MG tablet Commonly known as: ZETIA   glipiZIDE 10 MG 24 hr tablet Commonly known as: GLUCOTROL XL       TAKE these medications    acetaminophen 500 MG tablet Commonly known as: TYLENOL Take 500 mg by mouth  every 6 (six) hours as needed.   carvedilol 25 MG tablet Commonly known as: COREG Take 25 mg by mouth 2 (two) times daily with a meal. Dr Darrold Junker   docusate sodium 100 MG capsule Commonly known as: COLACE Take 1 capsule (100 mg total) by mouth 2 (two) times daily.   insulin aspart 100 UNIT/ML injection Commonly known as: novoLOG Inject 3 Units into the skin 3 (three) times daily with meals.   insulin aspart 100 UNIT/ML injection Commonly known as: novoLOG Inject 0-15 Units into the skin 3 (three) times daily  with meals. CBG 70 - 120: 0 units CBG 121 - 150: 2 units CBG 151 - 200: 3 units CBG 201 - 250: 5 units CBG 251 - 300: 8 units CBG 301 - 350: 11 units CBG 351 - 400: 15 units CBG > 400: call MD and obtain STAT lab verification   insulin glargine-yfgn 100 UNIT/ML injection Commonly known as: SEMGLEE Inject 0.18 mLs (18 Units total) into the skin at bedtime.   lisinopril 10 MG tablet Commonly known as: ZESTRIL Take 1 tablet (10 mg total) by mouth daily. Dr Darrold Junker   omeprazole 20 MG capsule Commonly known as: PRILOSEC Take 20 mg by mouth at bedtime and may repeat dose one time if needed. Dr Darrold Junker   polyethylene glycol 17 g packet Commonly known as: MIRALAX / GLYCOLAX Take 17 g by mouth daily.   senna 8.6 MG Tabs tablet Commonly known as: SENOKOT Take 1 tablet (8.6 mg total) by mouth at bedtime.        Contact information for after-discharge care     Destination     [facility] Preferred SNF .   Service: Skilled Nursing Contact information: [address] [place] [address] [phone]                    Discharge Exam: Ceasar Mons Weights   10/24/22 1223  Weight: 52.2 kg   General - Elderly Caucasian female, no apparent distress HEENT - PERRLA, EOMI, atraumatic head, hard of hearing. Lung - Clear, rales, rhonchi, wheezes. Heart - S1, S2 heard, no murmurs, rubs, trace pedal edema Neuro - Alert, awake and oriented x 3, non focal exam. Skin - Warm and dry.   Condition at discharge: stable  The results of significant diagnostics from this hospitalization (including imaging, microbiology, ancillary and laboratory) are listed below for reference.   Imaging Studies: DG Chest Port 1 View  Result Date: 10/24/2022 CLINICAL DATA:  Syncope. EXAM: PORTABLE CHEST 1 VIEW COMPARISON:  September 28, 2020. FINDINGS: The heart size and mediastinal contours are within normal limits. Left-sided defibrillator is unchanged in position. Both  lungs are clear. The visualized skeletal structures are unremarkable. IMPRESSION: No active disease. Electronically Signed   By: Lupita Raider M.D.   On: 10/24/2022 15:56   CT Head Wo Contrast  Result Date: 10/24/2022 CLINICAL DATA:  Mental status change EXAM: CT HEAD WITHOUT CONTRAST TECHNIQUE: Contiguous axial images were obtained from the base of the skull through the vertex without intravenous contrast. RADIATION DOSE REDUCTION: This exam was performed according to the departmental dose-optimization program which includes automated exposure control, adjustment of the mA and/or kV according to patient size and/or use of iterative reconstruction technique. COMPARISON:  CT head 03/19/2017 FINDINGS: Brain: No intracranial hemorrhage, mass effect, or evidence of acute infarct. No hydrocephalus. No extra-axial fluid collection. Generalized cerebral atrophy. Ill-defined hypoattenuation within the cerebral white matter is nonspecific but consistent with  chronic small vessel ischemic disease. Vascular: No hyperdense vessel. Intracranial arterial calcification. Skull: No fracture or focal lesion. Sinuses/Orbits: No acute finding. Postoperative change about the right maxillary sinus. Mucosal thickening throughout the paranasal sinuses. No mastoid effusion. Other: None. IMPRESSION: 1. No acute intracranial abnormality. Electronically Signed   By: Minerva Fester M.D.   On: 10/24/2022 15:30    Microbiology: Results for orders placed or performed during the hospital encounter of 10/24/22  Urine Culture     Status: Abnormal   Collection Time: 10/24/22  3:56 PM   Specimen: Urine, Random  Result Value Ref Range Status   Specimen Description   Final    URINE, RANDOM Performed at [facility], [address] [place], [place] [address]    Special Requests   Final    NONE Performed at [facility], [address] [place], [place] [address]    Culture MULTIPLE SPECIES PRESENT, SUGGEST  RECOLLECTION (A)  Final   Report Status 10/26/2022 FINAL  Final  MRSA Next Gen by PCR, Nasal     Status: None   Collection Time: 10/24/22  5:58 PM   Specimen: Nasal Mucosa; Nasal Swab  Result Value Ref Range Status   MRSA by PCR Next Gen NOT DETECTED NOT DETECTED Final    Comment: (NOTE) The GeneXpert MRSA Assay (FDA approved for NASAL specimens only), is one component of a comprehensive MRSA colonization surveillance program. It is not intended to diagnose MRSA infection nor to guide or monitor treatment for MRSA infections. Test performance is not FDA approved in patients less than 82 years old. Performed at [facility], [address] [place], [place] [address]     Labs: CBC: Recent Labs  Lab 10/24/22 1226 10/25/22 0627  WBC 11.0* 8.0  HGB 14.9 11.2*  HCT 43.7 31.8*  MCV 86.7 85.0  PLT 204 175   Basic Metabolic Panel: Recent Labs  Lab 10/24/22 1753 10/24/22 2106 10/25/22 0036 10/25/22 0627 10/25/22 0809  NA 137 138 136 135 138  K 4.2 3.5 3.5 3.1* 3.1*  CL 104 104 106 107 107  CO2 19* 22 22 25 23   GLUCOSE 299* 195* 160* 132* 153*  BUN 25* 25* 25* 23 22  CREATININE 0.72 0.56 0.54 0.51 0.50  CALCIUM 9.1 9.1 8.8* 8.8* 8.8*   Liver Function Tests: Recent Labs  Lab 10/24/22 1226  AST 22  ALT 13  ALKPHOS 52  BILITOT 2.2*  PROT 7.2  ALBUMIN 3.7   CBG: Recent Labs  Lab 10/28/22 0753 10/28/22 1141 10/28/22 1627 10/28/22 2105 10/29/22 0736  GLUCAP 247* 88 141* 196* 121*    Discharge time spent: greater than 30 minutes.  Signed: Marcelino Duster, MD [facility] 10/29/2022

## 2022-10-29 NOTE — TOC Progression Note (Signed)
Transition of Care [facility]) - Progression Note    Patient Details  Name: Emma Guzman MRN: [id number] Date of Birth: 1946-02-19  Transition of Care [facility]) CM/SW Contact  Margarito Liner, LCSW Phone Number: 10/29/2022, 1:55 PM  Clinical Narrative: Patient needs to have a BM prior to discharge to [facility]. Son is aware.      Barriers to Discharge: Barriers Resolved  Expected Discharge Plan and Services       Living arrangements for the past 2 months: Apartment Expected Discharge Date: 10/29/22                                     Social Determinants of Health (SDOH) Interventions SDOH Screenings   Food Insecurity: No Food Insecurity (10/27/2022)  Housing: Patient Declined (10/27/2022)  Transportation Needs: No Transportation Needs (10/27/2022)  Utilities: Not At Risk (10/27/2022)  Financial Resource Strain: Low Risk  (09/15/2021)   Received from [facility]  Physical Activity: Inactive (09/15/2021)   Received from [facility]  Social Connections: Moderately Integrated (09/15/2021)   Received from [facility]  Stress: Stress Concern Present (09/15/2021)   Received from [facility]  Tobacco Use: Low Risk  (10/24/2022)    Readmission Risk Interventions     No data to display

## 2023-01-17 ENCOUNTER — Emergency Department: Payer: Medicare Other

## 2023-01-17 ENCOUNTER — Emergency Department
Admission: EM | Admit: 2023-01-17 | Discharge: 2023-01-18 | Disposition: A | Discharge status: Home / Self Care | Payer: Medicare Other | Attending: Emergency Medicine | Admitting: Emergency Medicine

## 2023-01-17 ENCOUNTER — Other Ambulatory Visit: Payer: Self-pay

## 2023-01-17 DIAGNOSIS — Z950 Presence of cardiac pacemaker: Secondary | ICD-10-CM | POA: Diagnosis not present

## 2023-01-17 DIAGNOSIS — E119 Type 2 diabetes mellitus without complications: Secondary | ICD-10-CM | POA: Diagnosis not present

## 2023-01-17 DIAGNOSIS — I5023 Acute on chronic systolic (congestive) heart failure: Secondary | ICD-10-CM | POA: Insufficient documentation

## 2023-01-17 DIAGNOSIS — I491 Atrial premature depolarization: Secondary | ICD-10-CM | POA: Insufficient documentation

## 2023-01-17 DIAGNOSIS — E876 Hypokalemia: Secondary | ICD-10-CM | POA: Diagnosis not present

## 2023-01-17 DIAGNOSIS — R531 Weakness: Secondary | ICD-10-CM | POA: Diagnosis present

## 2023-01-17 DIAGNOSIS — I110 Hypertensive heart disease with heart failure: Secondary | ICD-10-CM | POA: Insufficient documentation

## 2023-01-17 DIAGNOSIS — I6782 Cerebral ischemia: Secondary | ICD-10-CM | POA: Insufficient documentation

## 2023-01-17 DIAGNOSIS — D72829 Elevated white blood cell count, unspecified: Secondary | ICD-10-CM | POA: Insufficient documentation

## 2023-01-17 DIAGNOSIS — Z1152 Encounter for screening for COVID-19: Secondary | ICD-10-CM | POA: Insufficient documentation

## 2023-01-17 LAB — BASIC METABOLIC PANEL
Anion gap: 15 (ref 5–15)
BUN: 22 mg/dL (ref 8–23)
CO2: 25 mmol/L (ref 22–32)
Calcium: 10.1 mg/dL (ref 8.9–10.3)
Chloride: 94 mmol/L — ABNORMAL LOW (ref 98–111)
Creatinine, Ser: 0.7 mg/dL (ref 0.44–1.00)
GFR, Estimated: >60 mL/min (ref >60)
Glucose, Bld: 260 mg/dL — ABNORMAL HIGH (ref 70–99)
Potassium: 3.3 mmol/L — ABNORMAL LOW (ref 3.5–5.1)
Sodium: 134 mmol/L — ABNORMAL LOW (ref 135–145)

## 2023-01-17 LAB — CBC WITH DIFFERENTIAL/PLATELET
Abs Immature Granulocytes: 0.04 10*3/uL (ref 0.00–0.07)
Basophils Absolute: 0 10*3/uL (ref 0.0–0.1)
Basophils Relative: 0 %
Eosinophils Absolute: 0 10*3/uL (ref 0.0–0.5)
Eosinophils Relative: 0 %
HCT: 39.1 % (ref 36.0–46.0)
Hemoglobin: 13 g/dL (ref 12.0–15.0)
Immature Granulocytes: 0 %
Lymphocytes Relative: 10 %
Lymphs Abs: 1.1 10*3/uL (ref 0.7–4.0)
MCH: 29.8 pg (ref 26.0–34.0)
MCHC: 33.2 g/dL (ref 30.0–36.0)
MCV: 89.7 fL (ref 80.0–100.0)
Monocytes Absolute: 0.8 10*3/uL (ref 0.1–1.0)
Monocytes Relative: 7 %
Neutro Abs: 9.7 10*3/uL — ABNORMAL HIGH (ref 1.7–7.7)
Neutrophils Relative %: 83 %
Platelets: 177 10*3/uL (ref 150–400)
RBC: 4.36 MIL/uL (ref 3.87–5.11)
RDW: 13.4 % (ref 11.5–15.5)
WBC: 11.7 10*3/uL — ABNORMAL HIGH (ref 4.0–10.5)
nRBC: 0 % (ref 0.0–0.2)

## 2023-01-17 NOTE — ED Triage Notes (Signed)
Pt presents via POV with son c/o weakness. Son reports pt weak today and unable to ambulate today. Son reports pt was "foggy" today and "extra slow". Denies acute pain. Denies dysuria. A&Ox4.

## 2023-01-17 NOTE — ED Provider Notes (Signed)
[facility] Provider Note    Event Date/Time   First MD Initiated Contact with Patient 01/17/23 2300     (approximate)   History   Weakness   HPI  Emma Guzman is a 77 y.o. female with a history of cardiac disease cardiomyopathy pacemaker.  She had previous issues with confusion caused by urinary tract infection.  This morning son reports that she seemed like she was just a little bit slower in her movement.  More fatigued than typical and it is presented throughout the day.  Her speech has been clear she has not had any weakness in 1 area no facial droop no headaches.  No chest pain no trouble breathing no cough or fever.  She has not noticed any problems with urination.  She just reports she feels a bit tired    PMH: Morbid obesity (HCC) CARDIOMYOPATHY, PRIMARY, DILATED LBBB SYSTOLIC HEART FAILURE, ACUTE ON CHRONIC Implantable cardioverter-defibrillator (ICD) in situ Apnea, sleep Automatic implantable cardioverter-defibrillator in situ Cardiac defibrillator in place Cardiomyopathy [facility]) Endomyocardial disease (HCC) Acid reflux Block, bundle branch, left DKA (diabetic ketoacidosis) (HCC) Hypertension Hypercholesteremia Diabetes mellitus without complication (HCC) Elevated CK UTI (urinary tract infection) Elevated lactic acid level Fall at home, initial encounter         Today, per son, checked in with patient this AM.  She seemed sort of fatigued  Weak, slid out of chair earlier. No fall.  Typically walks with use of a walker   Physical Exam   Triage Vital Signs: ED Triage Vitals  Encounter Vitals Group     BP 01/17/23 2121 (!) 148/89     Systolic BP Percentile --      Diastolic BP Percentile --      Pulse Rate 01/17/23 2121 94     Resp 01/17/23 2121 18     Temp 01/17/23 2121 98.2 F (36.8 C)     Temp Source 01/17/23 2121 Oral     SpO2 01/17/23 2121 96 %     Weight 01/17/23 2122 120 lb (54.4 kg)     Height 01/17/23  2122 5' (1.524 m)     Head Circumference --      [facility] --      Pain Score 01/17/23 2138 4     Pain Loc --      Pain Education --      Exclude from Growth Chart --     Most recent vital signs: Vitals:   01/18/23 0030 01/18/23 0100  BP: (!) 149/96 (!) 151/90  Pulse: 92 87  Resp: (!) 25 18  Temp:    SpO2: 98% 97%     General: Awake, no distress.  Normocephalic atraumatic CV:  Good peripheral perfusion.  Normal scar from pacemaker left upper chest clean dry intact Resp:  Normal effort.  Clear bilateral Normal heart tones and rate Abd:  No distention.  Soft nontender nondistended throughout Other:  No edema.  Patient alert well-oriented.  Normal extraocular movements.  Normal facial expressions and smile.  Speech is clear.  Moves all extremities 5 out of 5 strength.  No pronator drift in any extremity.  Sensation normal in all extremities and face bilateral   ED Results / Procedures / Treatments   Labs (all labs ordered are listed, but only abnormal results are displayed) Labs Reviewed  CBC WITH DIFFERENTIAL/PLATELET - Abnormal; Notable for the following components:      Result Value   WBC 11.7 (*)    Neutro  Abs 9.7 (*)    All other components within normal limits  BASIC METABOLIC PANEL - Abnormal; Notable for the following components:   Sodium 134 (*)    Potassium 3.3 (*)    Chloride 94 (*)    Glucose, Bld 260 (*)    All other components within normal limits  URINALYSIS, ROUTINE W REFLEX MICROSCOPIC - Abnormal; Notable for the following components:   Color, Urine YELLOW (*)    APPearance CLEAR (*)    Glucose, UA >=500 (*)    Ketones, ur 20 (*)    All other components within normal limits  RESP PANEL BY RT-PCR (FLU A&B, COVID) [id number]     EKG  And interpreted by me at 2130 heart rate 95 QRS 140 QTc 500 Atrial sensed ventricular paced rhythm.  No obvious acute abnormality or concern.   RADIOLOGY  DG Chest 2 View  Result Date: 01/18/2023 CLINICAL DATA:   Weakness EXAM: CHEST - 2 VIEW COMPARISON:  10/24/2022 FINDINGS: Multi lead left-sided pacing device. Normal cardiac size. Aortic atherosclerosis. No focal opacity, pleural effusion, or pneumothorax IMPRESSION: No active cardiopulmonary disease. Electronically Signed   By: Jasmine Pang M.D.   On: 01/18/2023 00:08   CT Head Wo Contrast  Result Date: 01/17/2023 CLINICAL DATA:  Acute stroke suspected.  Weakness. EXAM: CT HEAD WITHOUT CONTRAST TECHNIQUE: Contiguous axial images were obtained from the base of the skull through the vertex without intravenous contrast. RADIATION DOSE REDUCTION: This exam was performed according to the departmental dose-optimization program which includes automated exposure control, adjustment of the mA and/or kV according to patient size and/or use of iterative reconstruction technique. COMPARISON:  Head CT 09/24/2022 FINDINGS: Brain: No evidence of acute infarction, hemorrhage, hydrocephalus, extra-axial collection or mass lesion/mass effect. There is mild diffuse atrophy and moderate periventricular and deep white matter hypodensity, similar to the prior study. Vascular: Atherosclerotic calcifications are present within the cavernous internal carotid arteries. Skull: Normal. Negative for fracture or focal lesion. Sinuses/Orbits: Patient is status post sinonasal surgery on the right. Paranasal sinuses are clear. Orbits are unremarkable. Other: None. IMPRESSION: 1. No acute intracranial abnormality. 2. Atrophy and chronic microvascular disease. Electronically Signed   By: Darliss Cheney M.D.   On: 01/17/2023 23:56    Patient is not a good candidate for MRI, cardiac pacemaker-patient self reports she has been told not to undergo MRI with her current pacemaker   PROCEDURES:  Critical Care performed: No  Procedures   MEDICATIONS ORDERED IN ED: Medications - No data to display   IMPRESSION / MDM / ASSESSMENT AND PLAN / ED COURSE  I reviewed the triage vital signs and the  nursing notes.                              Differential diagnosis includes, but is not limited to, electrolyte abnormality dehydration, fatigue, general weakness, deconditioning, metabolic, medication effect, infection etc.  Differential diagnose quite broad.  She has no clinical exam findings that would suggest an acute central neurologic features such as stroke or ischemia.  CT of the head is reassuring.  She is not a great candidate for MRI as she reports cardiac pacemaker and that she was instructed not to undergo MRI  Workup reassuring.  No evidence of acute urinary tract infection.  In the past she has had issues with adherence to medications for diabetes but her son reports they have been very cognizant of her medications.  Her blood  glucose appropriate anion gap normal.  CO2 normal no evidence DKA or HHNK.  COVID test and chest x-ray clear  Mild hypokalemia repleted.  Labs show slight leukocytosis, but no obvious source.  No rashes no headache no infectious symptoms  Patient's presentation is most consistent with acute complicated illness / injury requiring diagnostic workup.  The patient is on the cardiac monitor to evaluate for evidence of arrhythmia and/or significant heart rate changes.   ----------------------------------------- 2:20 AM on 01/18/2023 ----------------------------------------- Updated patient and son on results she is awake alert and oriented.  Will trial ambulation, if she is able to ambulate to her baseline at this point I think she could be discharged with return precautions and has no obvious concern or abnormality identified on examination today.  ----------------------------------------- 3:10 AM on 01/18/2023 ----------------------------------------- Patient up using walker, no difficulty.  Moving to her baseline.  Feels well at this time.  Reassuring workup.  Careful return precautions advised the patient and her son, follow-up closely with her primary is  encouraged. Return precautions and treatment recommendations and follow-up discussed with the patient who is agreeable with the plan.      FINAL CLINICAL IMPRESSION(S) / ED DIAGNOSES   Final diagnoses:  Generalized weakness     Rx / DC Orders   ED Discharge Orders     None        Note:  This document was prepared using Dragon voice recognition software and may include unintentional dictation errors.   Sharyn Creamer, MD 01/18/23 [phone]

## 2023-01-18 LAB — URINALYSIS, ROUTINE W REFLEX MICROSCOPIC
Bacteria, UA: NONE SEEN
Bilirubin Urine: NEGATIVE
Glucose, UA: >=500 mg/dL — AB
Hgb urine dipstick: NEGATIVE
Ketones, ur: 20 mg/dL — AB
Leukocytes,Ua: NEGATIVE
Nitrite: NEGATIVE
Protein, ur: NEGATIVE mg/dL
Specific Gravity, Urine: 1.025 (ref 1.005–1.030)
pH: 5 (ref 5.0–8.0)

## 2023-01-18 LAB — RESP PANEL BY RT-PCR (FLU A&B, COVID) ARPGX2
Influenza A by PCR: NEGATIVE
Influenza B by PCR: NEGATIVE
SARS Coronavirus 2 by RT PCR: NEGATIVE

## 2023-01-18 MED ORDER — POTASSIUM CHLORIDE CRYS ER 20 MEQ PO TBCR
40.0000 meq | EXTENDED_RELEASE_TABLET | Freq: Once | ORAL | Status: AC
  Administered 2023-01-18: 40 meq via ORAL
  Filled 2023-01-18: qty 2

## 2023-01-18 NOTE — ED Notes (Signed)
Writer ambulated patient in hallway with walker. Pt ambulated independently with slow/steady gait.

## 2023-01-19 NOTE — Plan of Care (Signed)
[facility] Tonsillectomy/Adenoidectomy, Postoperative PEDS care plan entered in error.

## 2023-08-02 ENCOUNTER — Emergency Department

## 2023-08-02 ENCOUNTER — Inpatient Hospital Stay
Admission: EM | Admit: 2023-08-02 | Discharge: 2023-08-07 | DRG: 564 | Disposition: A | Discharge status: Skilled Nursing Facility | Attending: Internal Medicine | Admitting: Internal Medicine

## 2023-08-02 ENCOUNTER — Other Ambulatory Visit: Payer: Self-pay

## 2023-08-02 DIAGNOSIS — Y92008 Other place in unspecified non-institutional (private) residence as the place of occurrence of the external cause: Secondary | ICD-10-CM

## 2023-08-02 DIAGNOSIS — F32A Depression, unspecified: Secondary | ICD-10-CM | POA: Diagnosis present

## 2023-08-02 DIAGNOSIS — Z794 Long term (current) use of insulin: Secondary | ICD-10-CM | POA: Diagnosis not present

## 2023-08-02 DIAGNOSIS — E669 Obesity, unspecified: Secondary | ICD-10-CM | POA: Diagnosis present

## 2023-08-02 DIAGNOSIS — G9341 Metabolic encephalopathy: Secondary | ICD-10-CM | POA: Diagnosis present

## 2023-08-02 DIAGNOSIS — R54 Age-related physical debility: Secondary | ICD-10-CM | POA: Diagnosis present

## 2023-08-02 DIAGNOSIS — Z66 Do not resuscitate: Secondary | ICD-10-CM | POA: Diagnosis present

## 2023-08-02 DIAGNOSIS — R748 Abnormal levels of other serum enzymes: Secondary | ICD-10-CM

## 2023-08-02 DIAGNOSIS — E876 Hypokalemia: Secondary | ICD-10-CM | POA: Diagnosis present

## 2023-08-02 DIAGNOSIS — I5032 Chronic diastolic (congestive) heart failure: Secondary | ICD-10-CM | POA: Diagnosis present

## 2023-08-02 DIAGNOSIS — Z79899 Other long term (current) drug therapy: Secondary | ICD-10-CM | POA: Diagnosis not present

## 2023-08-02 DIAGNOSIS — H9190 Unspecified hearing loss, unspecified ear: Secondary | ICD-10-CM | POA: Diagnosis present

## 2023-08-02 DIAGNOSIS — Z950 Presence of cardiac pacemaker: Secondary | ICD-10-CM | POA: Diagnosis not present

## 2023-08-02 DIAGNOSIS — I110 Hypertensive heart disease with heart failure: Secondary | ICD-10-CM | POA: Diagnosis present

## 2023-08-02 DIAGNOSIS — E860 Dehydration: Secondary | ICD-10-CM | POA: Diagnosis present

## 2023-08-02 DIAGNOSIS — W1830XA Fall on same level, unspecified, initial encounter: Secondary | ICD-10-CM | POA: Diagnosis present

## 2023-08-02 DIAGNOSIS — R296 Repeated falls: Secondary | ICD-10-CM | POA: Diagnosis present

## 2023-08-02 DIAGNOSIS — T796XXA Traumatic ischemia of muscle, initial encounter: Secondary | ICD-10-CM | POA: Diagnosis not present

## 2023-08-02 DIAGNOSIS — I160 Hypertensive urgency: Secondary | ICD-10-CM | POA: Diagnosis present

## 2023-08-02 DIAGNOSIS — E7800 Pure hypercholesterolemia, unspecified: Secondary | ICD-10-CM | POA: Diagnosis present

## 2023-08-02 DIAGNOSIS — E871 Hypo-osmolality and hyponatremia: Secondary | ICD-10-CM | POA: Diagnosis present

## 2023-08-02 DIAGNOSIS — I429 Cardiomyopathy, unspecified: Secondary | ICD-10-CM | POA: Diagnosis present

## 2023-08-02 DIAGNOSIS — K219 Gastro-esophageal reflux disease without esophagitis: Secondary | ICD-10-CM | POA: Diagnosis present

## 2023-08-02 DIAGNOSIS — M6282 Rhabdomyolysis: Secondary | ICD-10-CM | POA: Diagnosis present

## 2023-08-02 DIAGNOSIS — W19XXXA Unspecified fall, initial encounter: Principal | ICD-10-CM

## 2023-08-02 DIAGNOSIS — R4182 Altered mental status, unspecified: Secondary | ICD-10-CM

## 2023-08-02 DIAGNOSIS — E119 Type 2 diabetes mellitus without complications: Secondary | ICD-10-CM | POA: Diagnosis present

## 2023-08-02 DIAGNOSIS — F329 Major depressive disorder, single episode, unspecified: Secondary | ICD-10-CM | POA: Diagnosis not present

## 2023-08-02 DIAGNOSIS — R531 Weakness: Secondary | ICD-10-CM | POA: Diagnosis not present

## 2023-08-02 LAB — COMPREHENSIVE METABOLIC PANEL WITH GFR
ALT: 26 U/L (ref 0–44)
AST: 63 U/L — ABNORMAL HIGH (ref 15–41)
Albumin: 4.2 g/dL (ref 3.5–5.0)
Alkaline Phosphatase: 52 U/L (ref 38–126)
Anion gap: 18 — ABNORMAL HIGH (ref 5–15)
BUN: 25 mg/dL — ABNORMAL HIGH (ref 8–23)
CO2: 20 mmol/L — ABNORMAL LOW (ref 22–32)
Calcium: 10.1 mg/dL (ref 8.9–10.3)
Chloride: 96 mmol/L — ABNORMAL LOW (ref 98–111)
Creatinine, Ser: 0.69 mg/dL (ref 0.44–1.00)
GFR, Estimated: >60 mL/min (ref >60)
Glucose, Bld: 332 mg/dL — ABNORMAL HIGH (ref 70–99)
Potassium: 3.5 mmol/L (ref 3.5–5.1)
Sodium: 134 mmol/L — ABNORMAL LOW (ref 135–145)
Total Bilirubin: 1.7 mg/dL — ABNORMAL HIGH (ref 0.0–1.2)
Total Protein: 7.6 g/dL (ref 6.5–8.1)

## 2023-08-02 LAB — CBC
HCT: 45.4 % (ref 36.0–46.0)
Hemoglobin: 15.5 g/dL — ABNORMAL HIGH (ref 12.0–15.0)
MCH: 30.5 pg (ref 26.0–34.0)
MCHC: 34.1 g/dL (ref 30.0–36.0)
MCV: 89.4 fL (ref 80.0–100.0)
Platelets: 184 10*3/uL (ref 150–400)
RBC: 5.08 MIL/uL (ref 3.87–5.11)
RDW: 12.4 % (ref 11.5–15.5)
WBC: 10.3 10*3/uL (ref 4.0–10.5)
nRBC: 0 % (ref 0.0–0.2)

## 2023-08-02 LAB — CK: Total CK: 3382 U/L — ABNORMAL HIGH (ref 38–234)

## 2023-08-02 LAB — TROPONIN I (HIGH SENSITIVITY): Troponin I (High Sensitivity): 31 ng/L — ABNORMAL HIGH (ref <18)

## 2023-08-02 MED ORDER — ONDANSETRON HCL 4 MG/2ML IJ SOLN: 4.0000 mg | Freq: Four times a day (QID) | INTRAMUSCULAR | Status: DC | PRN

## 2023-08-02 MED ORDER — SODIUM CHLORIDE 0.9 % IV SOLN: INTRAVENOUS | Status: AC

## 2023-08-02 MED ORDER — SENNA 8.6 MG PO TABS: 1.0000 | ORAL_TABLET | Freq: Every day | ORAL | Status: DC

## 2023-08-02 MED ORDER — DOCUSATE SODIUM 100 MG PO CAPS: 100.0000 mg | ORAL_CAPSULE | Freq: Two times a day (BID) | ORAL | Status: DC

## 2023-08-02 MED ORDER — ACETAMINOPHEN 650 MG RE SUPP: 650.0000 mg | Freq: Four times a day (QID) | RECTAL | Status: DC | PRN

## 2023-08-02 MED ORDER — INSULIN GLARGINE-YFGN 100 UNIT/ML ~~LOC~~ SOLN
18.0000 [IU] | Freq: Every day | SUBCUTANEOUS | Status: DC
  Administered 2023-08-03 – 2023-08-05 (×4): 18 [IU] via SUBCUTANEOUS
  Filled 2023-08-02 (×6): qty 0.18

## 2023-08-02 MED ORDER — CARVEDILOL 25 MG PO TABS: 25.0000 mg | ORAL_TABLET | Freq: Two times a day (BID) | ORAL | Status: DC

## 2023-08-02 MED ORDER — MAGNESIUM HYDROXIDE 400 MG/5ML PO SUSP: 30.0000 mL | Freq: Every day | ORAL | Status: DC | PRN

## 2023-08-02 MED ORDER — ONDANSETRON HCL 4 MG PO TABS: 4.0000 mg | ORAL_TABLET | Freq: Four times a day (QID) | ORAL | Status: DC | PRN

## 2023-08-02 MED ORDER — PANTOPRAZOLE SODIUM 40 MG PO TBEC
40.0000 mg | DELAYED_RELEASE_TABLET | Freq: Every day | ORAL | Status: DC
  Administered 2023-08-03 – 2023-08-07 (×5): 40 mg via ORAL
  Filled 2023-08-02 (×5): qty 1

## 2023-08-02 MED ORDER — TRAZODONE HCL 50 MG PO TABS: 25.0000 mg | ORAL_TABLET | Freq: Every evening | ORAL | Status: DC | PRN

## 2023-08-02 MED ORDER — LISINOPRIL 10 MG PO TABS: 10.0000 mg | ORAL_TABLET | Freq: Every day | ORAL | Status: DC

## 2023-08-02 MED ORDER — ENOXAPARIN SODIUM 40 MG/0.4ML IJ SOSY
40.0000 mg | PREFILLED_SYRINGE | INTRAMUSCULAR | Status: DC
  Administered 2023-08-03 – 2023-08-07 (×5): 40 mg via SUBCUTANEOUS
  Filled 2023-08-02 (×5): qty 0.4

## 2023-08-02 MED ORDER — POLYETHYLENE GLYCOL 3350 17 G PO PACK
17.0000 g | PACK | Freq: Every day | ORAL | Status: DC
  Administered 2023-08-04 – 2023-08-06 (×3): 17 g via ORAL
  Filled 2023-08-02 (×4): qty 1

## 2023-08-02 MED ORDER — ACETAMINOPHEN 325 MG PO TABS
650.0000 mg | ORAL_TABLET | Freq: Four times a day (QID) | ORAL | Status: DC | PRN
  Administered 2023-08-03 – 2023-08-07 (×11): 650 mg via ORAL
  Filled 2023-08-02 (×11): qty 2

## 2023-08-02 MED ORDER — SODIUM CHLORIDE 0.9 % IV BOLUS
1000.0000 mL | Freq: Once | INTRAVENOUS | Status: AC
  Administered 2023-08-02: 1000 mL via INTRAVENOUS

## 2023-08-02 NOTE — H&P (Incomplete)
 [place]   PATIENT NAME: Emma Guzman    MR#:  [id number]  DATE OF BIRTH:  May 08, 1945  DATE OF ADMISSION:  08/02/2023  PRIMARY CARE PHYSICIAN: Alexander Anes, MD   Patient is coming from: Home  REQUESTING/REFERRING PHYSICIAN: Marylynn Soho, MD   CHIEF COMPLAINT:   Chief Complaint  Patient presents with   Fall    HISTORY OF PRESENT ILLNESS:  Emma CONNEL is a 78 y.o. Caucasian female with medical history significant for CHF, CKD, type 2 diabetes mellitus, GERD, hypertension, dyslipidemia, OSA and osteoarthritis, presented to the emergency room with acute onset of altered mental status with confusion.  The patient has been having recurrent falls lately without injuries.  She admits to urinary frequency and urgency with dysuria for the hematuria or flank pain.  She has been aching all over.  No nausea or vomiting or abdominal pain.  No cough or wheezing or dyspnea.  No chest pain or palpitations.  No headache or dizziness or blurry vision.  No paresthesias or focal muscle weakness.  ED Course: When came to the ER, BP was 148/91 with heart rate of 111 later on BP was 159/130.  Vital signs were otherwise within normal.  Labs revealed borderline potassium of 3.5 and mild hypokalemia and hyponatremia, CO2 of 20 and blood glucose of 332, BUN of 25 and a gap of 18 and AST 63 with total bili 1.7.  CBC showed hemoconcentration and CK was 3382.  High sensitive troponin I was 31.  UA still pending EKG as reviewed by me : Who EKG showed atrial sensed ventricular paced rhythm with a rate of 109. Imaging: 2 view chest x-ray showed no acute cardiopulmonary disease.  The patient was given 1 L bolus of IV normal saline.  She will be admitted to a medical telemetry bed for further evaluation and management. PAST MEDICAL HISTORY:   Past Medical History:  Diagnosis Date   Arthritis    "everywhere"   Cardiomyopathy [facility])    CHF (congestive heart failure) (HCC)    Chronic kidney  disease    stones   Diabetes mellitus without complication (HCC)    diet controlled   GERD (gastroesophageal reflux disease)    Hard of hearing    Hypercholesteremia    Hypertension    Incontinence    Obesity    Sleep apnea    No CPAP since sinus surgery several yrs ago   Wears dentures    partial upper    PAST SURGICAL HISTORY:   Past Surgical History:  Procedure Laterality Date   CATARACT EXTRACTION W/PHACO Left 03/14/2015   Procedure: CATARACT EXTRACTION PHACO AND INTRAOCULAR LENS PLACEMENT (IOC);  Surgeon: Billee Buddle, MD;  Location: [facility];  Service: Ophthalmology;  Laterality: Left;  DIABETIC - diet controlled   DENTAL SURGERY     IMPLANTABLE CARDIOVERTER DEFIBRILLATOR (ICD) GENERATOR CHANGE N/A 02/15/2015   Procedure: ICD GENERATOR CHANGE;  Surgeon: Percival Brace, MD;  Location: [facility] ORS;  Service: Cardiovascular;  Laterality: N/A;   KIDNEY STONE SURGERY     PACEMAKER INSERTION     TONSILLECTOMY      SOCIAL HISTORY:   Social History   Tobacco Use   Smoking status: Never   Smokeless tobacco: Never  Substance Use Topics   Alcohol use: No    FAMILY HISTORY:   Family History  Problem Relation Age of Onset   Cancer Mother    Cancer Father    Breast cancer Neg  Hx     DRUG ALLERGIES:  No Known Allergies  REVIEW OF SYSTEMS:   ROS As per history of present illness. All pertinent systems were reviewed above. Constitutional, HEENT, cardiovascular, respiratory, GI, GU, musculoskeletal, neuro, psychiatric, endocrine, integumentary and hematologic systems were reviewed and are otherwise negative/unremarkable except for positive findings mentioned above in the HPI.   MEDICATIONS AT HOME:   Prior to Admission medications   Medication Sig Start Date End Date Taking? Authorizing Provider  acetaminophen  (TYLENOL ) 500 MG tablet Take 500 mg by mouth every 6 (six) hours as needed.    [provider]  carvedilol  (COREG ) 25 MG  tablet Take 25 mg by mouth 2 (two) times daily with a meal. Dr Parks Bollman    [provider]  docusate sodium  (COLACE) 100 MG capsule Take 1 capsule (100 mg total) by mouth 2 (two) times daily. 10/29/22   Aisha Hove, MD  insulin  aspart (NOVOLOG ) 100 UNIT/ML injection Inject 3 Units into the skin 3 (three) times daily with meals. 10/29/22   Aisha Hove, MD  insulin  aspart (NOVOLOG ) 100 UNIT/ML injection Inject 0-15 Units into the skin 3 (three) times daily with meals. CBG 70 - 120: 0 units CBG 121 - 150: 2 units CBG 151 - 200: 3 units CBG 201 - 250: 5 units CBG 251 - 300: 8 units CBG 301 - 350: 11 units CBG 351 - 400: 15 units CBG > 400: call MD and obtain STAT lab verification 10/29/22   Aisha Hove, MD  insulin  glargine-yfgn (SEMGLEE ) 100 UNIT/ML injection Inject 0.18 mLs (18 Units total) into the skin at bedtime. 10/29/22   Aisha Hove, MD  lisinopril  (ZESTRIL ) 10 MG tablet Take 1 tablet (10 mg total) by mouth daily. Dr Parks Bollman 10/29/22   Aisha Hove, MD  omeprazole (PRILOSEC) 20 MG capsule Take 20 mg by mouth at bedtime and may repeat dose one time if needed. Dr Parks Bollman    [provider]  polyethylene glycol (MIRALAX  / GLYCOLAX ) 17 g packet Take 17 g by mouth daily. 10/29/22   Aisha Hove, MD  senna (SENOKOT) 8.6 MG TABS tablet Take 1 tablet (8.6 mg total) by mouth at bedtime. 10/29/22   Aisha Hove, MD      VITAL SIGNS:  Blood pressure (!) 153/95, pulse 97, temperature 98.4 F (36.9 C), temperature source Oral, resp. rate 17, height 5\' 1"  (1.549 m), weight 59 kg, SpO2 100%.  PHYSICAL EXAMINATION:  Physical Exam  GENERAL:  78 y.o.-year-old patient lying in the bed with no acute distress.  EYES: Pupils equal, round, reactive to light and accommodation. No scleral icterus. Extraocular muscles intact.  HEENT: Head atraumatic, normocephalic. Oropharynx and nasopharynx clear.  NECK:  Supple, no jugular venous  distention. No thyroid enlargement, no tenderness.  LUNGS: Normal breath sounds bilaterally, no wheezing, rales,rhonchi or crepitation. No use of accessory muscles of respiration.  CARDIOVASCULAR: Regular rate and rhythm, S1, S2 normal. No murmurs, rubs, or gallops.  ABDOMEN: Soft, nondistended, nontender. Bowel sounds present. No organomegaly or mass.  EXTREMITIES: No pedal edema, cyanosis, or clubbing.  NEUROLOGIC: Cranial nerves II through XII are intact. Muscle strength 5/5 in all extremities. Sensation intact. Gait not checked.  PSYCHIATRIC: The patient is alert and oriented x 3.  Normal affect and good eye contact. SKIN: No obvious rash, lesion, or ulcer.   LABORATORY PANEL:   CBC Recent Labs  Lab 08/03/23 0405  WBC 10.3  HGB 14.2  HCT 42.2  PLT 182   ------------------------------------------------------------------------------------------------------------------  Chemistries  Recent  Labs  Lab 08/02/23 1845  NA 134*  K 3.5  CL 96*  CO2 20*  GLUCOSE 332*  BUN 25*  CREATININE 0.69  CALCIUM 10.1  AST 63*  ALT 26  ALKPHOS 52  BILITOT 1.7*   ------------------------------------------------------------------------------------------------------------------  Cardiac Enzymes No results for input(s): "TROPONINI" in the last 168 hours. ------------------------------------------------------------------------------------------------------------------  RADIOLOGY:  DG Chest 2 View Result Date: 08/02/2023 CLINICAL DATA:  Left chest pain after several falls today. EXAM: CHEST - 2 VIEW COMPARISON:  01/17/2023 FINDINGS: Cardiac pacemaker. Shallow inspiration. Heart size and pulmonary vascularity are normal. Lungs are clear. No pleural effusion or pneumothorax. Mediastinal contours appear intact. Calcified aorta. Degenerative changes in the spine. IMPRESSION: No active cardiopulmonary disease. Electronically Signed   By: Boyce Byes M.D.   On: 08/02/2023 22:09   CT Cervical  Spine Wo Contrast Result Date: 08/02/2023 CLINICAL DATA:  History of multiple falls today, initial encounter EXAM: CT CERVICAL SPINE WITHOUT CONTRAST TECHNIQUE: Multidetector CT imaging of the cervical spine was performed without intravenous contrast. Multiplanar CT image reconstructions were also generated. RADIATION DOSE REDUCTION: This exam was performed according to the departmental dose-optimization program which includes automated exposure control, adjustment of the mA and/or kV according to patient size and/or use of iterative reconstruction technique. COMPARISON:  None Available. FINDINGS: Alignment: Within normal limits. Skull base and vertebrae: 7 cervical segments are well visualized. Vertebral body height is well maintained. No acute fracture or acute facet abnormality is noted. Facet hypertrophic changes are identified as arm. Mild osteophytic changes. Soft tissues and spinal canal: Surrounding soft tissue structures are within normal limits. Upper chest: Visualized lung apices are unremarkable. Other: None IMPRESSION: Degenerative change without acute abnormality. Electronically Signed   By: Violeta Grey M.D.   On: 08/02/2023 19:18   CT Head Wo Contrast Result Date: 08/02/2023 CLINICAL DATA:  Fall EXAM: CT HEAD WITHOUT CONTRAST TECHNIQUE: Contiguous axial images were obtained from the base of the skull through the vertex without intravenous contrast. RADIATION DOSE REDUCTION: This exam was performed according to the departmental dose-optimization program which includes automated exposure control, adjustment of the mA and/or kV according to patient size and/or use of iterative reconstruction technique. COMPARISON:  Head CT 01/17/2023 FINDINGS: Brain: No evidence of acute infarction, hemorrhage, hydrocephalus, extra-axial collection or mass lesion/mass effect. There is moderate patchy periventricular white matter hypodensity similar to the prior study. Vascular: Atherosclerotic calcifications are  present within the cavernous internal carotid arteries. Skull: Normal. Negative for fracture or focal lesion. Sinuses/Orbits: No acute finding. Other: None. IMPRESSION: 1. No acute intracranial process. 2. Moderate chronic small vessel ischemic changes. Electronically Signed   By: Tyron Gallon M.D.   On: 08/02/2023 19:18      IMPRESSION AND PLAN:  Assessment and Plan: * Rhabdomyolysis - This is likely secondary to her recurrent falls. - The patient will be admitted to a medical telemetry bed. - Will continue aggressive hydration with IV normal saline. - Will follow CK levels. - Pain management will be provided.  Hypertensive urgency - Will continue antihypertensive therapy and place on as needed IV labetalol.  Depression -Will continue Celexa.  GERD without esophagitis - Continue PPI therapy.  Type 2 diabetes mellitus without complications (HCC) - The patient will be placed on supplemental coverage with NovoLog . - Will continue basal coverage and Glucotrol XL.   DVT prophylaxis: Lovenox .  Advanced Care Planning:  Code Status: She is DNR and DNI. Family Communication:  The plan of care was discussed in details with the patient (and  family). I answered all questions. The patient agreed to proceed with the above mentioned plan. Further management will depend upon hospital course. Disposition Plan: Back to previous home environment Consults called: none.  All the records are reviewed and case discussed with ED provider.  Status is: Inpatient    At the time of the admission, it appears that the appropriate admission status for this patient is inpatient.  This is judged to be reasonable and necessary in order to provide the required intensity of service to ensure the patient's safety given the presenting symptoms, physical exam findings and initial radiographic and laboratory data in the context of comorbid conditions.  The patient requires inpatient status due to high intensity of  service, high risk of further deterioration and high frequency of surveillance required.  I certify that at the time of admission, it is my clinical judgment that the patient will require inpatient hospital care extending more than 2 midnights.                            Dispo: The patient is from: Home              Anticipated d/c is to: Home              Patient currently is not medically stable to d/c.              Difficult to place patient: No  Virgene Griffin M.D on 08/03/2023 at 4:57 AM  [facility]   From 7 PM-7 AM, contact night-coverage [email or web address]  CC: Primary care physician; George, Sionne A, MD

## 2023-08-02 NOTE — ED Notes (Signed)
 Rn to bedside to introduce self to pt. Family at bedside. Family advised he showed up this morning to get her breakfast and she was in the floor. Probably since last night. He got her up and back in her chair and then went back this evening and she was in the floor again.Emma Guzman pt lives at a geriatric facility for [facility]. Family advised the last time she fell this afternoon she vomited. Pt is alert. Family feels like her mental status is abnormal.

## 2023-08-02 NOTE — ED Notes (Signed)
 This RN cleaned pt from soiled brief and purewick placed.

## 2023-08-02 NOTE — ED Triage Notes (Signed)
 Pt was found on the floor three separate times by family in the last 12 hrs. Pt has medial redness to the right foot that is non blanchable. Pt is currently wearing a c collar.

## 2023-08-02 NOTE — ED Provider Notes (Addendum)
 [facility] Provider Note    Event Date/Time   First MD Initiated Contact with Patient 08/02/23 2034     (approximate)   History   Fall   HPI  Emma Guzman is a 78 y.o. female  who presents to the emergency department today because of concern for multiple falls today and AMS. Some history is obtained from son at bedside. He checks on her in the morning and evening. When he went by this morning he found her on the ground, unclear when overnight she had fallen. She then had two more falls today. The patient is complaining of left chest and left elbow pain after the fall. Son also noticed the patient was more confused today than she normally is. Patient denies any cough or change in urination.     Physical Exam   Triage Vital Signs: ED Triage Vitals  Encounter Vitals Group     BP 08/02/23 1854 (!) 141/88     Systolic BP Percentile --      Diastolic BP Percentile --      Pulse Rate 08/02/23 1854 (!) 116     Resp 08/02/23 1854 17     Temp 08/02/23 1854 98.3 F (36.8 C)     Temp Source 08/02/23 1854 Oral     SpO2 08/02/23 1854 96 %     Weight 08/02/23 1843 130 lb (59 kg)     Height --      Head Circumference --      Peak Flow --      Pain Score 08/02/23 1843 0   Most recent vital signs: Vitals:   08/02/23 1854  BP: (!) 141/88  Pulse: (!) 116  Resp: 17  Temp: 98.3 F (36.8 C)  SpO2: 96%   General: Awake, alert, not completely oriented. CV:  Good peripheral perfusion. Tachycardic. Resp:  Normal effort. Lungs clear. Abd:  No distention. Non tender.   ED Results / Procedures / Treatments   Labs (all labs ordered are listed, but only abnormal results are displayed) Labs Reviewed  CBC - Abnormal; Notable for the following components:      Result Value   Hemoglobin 15.5 (*)    All other components within normal limits  COMPREHENSIVE METABOLIC PANEL WITH GFR - Abnormal; Notable for the following components:   Sodium 134 (*)     Chloride 96 (*)    CO2 20 (*)    Glucose, Bld 332 (*)    BUN 25 (*)    AST 63 (*)    Total Bilirubin 1.7 (*)    Anion gap 18 (*)    All other components within normal limits  CK - Abnormal; Notable for the following components:   Total CK 3,382 (*)    All other components within normal limits  URINALYSIS, ROUTINE W REFLEX MICROSCOPIC     EKG  I, Marylynn Soho, attending physician, personally viewed and interpreted this EKG  EKG Time: 2241 Rate: 109 Rhythm: atrial sensed ventricular paced rhythm Axis: left axis deviation Intervals: qtc 493 QRS: wide ST changes: no st elevation Impression: abnormal ekg   RADIOLOGY I independently interpreted and visualized the CT head. My interpretation: No ICH Radiology interpretation:   IMPRESSION:  1. No acute intracranial process.  2. Moderate chronic small vessel ischemic changes.   I independently interpreted and visualized the CT cervical spine. My interpretation: No fracture Radiology interpretation:  IMPRESSION:  Degenerative change without acute abnormality.    I independently interpreted and  visualized the CXR. My interpretation: No pneumonia Radiology interpretation:  IMPRESSION:  No active cardiopulmonary disease.      PROCEDURES:  Critical Care performed: No    MEDICATIONS ORDERED IN ED: Medications - No data to display   IMPRESSION / MDM / ASSESSMENT AND PLAN / ED COURSE  I reviewed the triage vital signs and the nursing notes.                              Differential diagnosis includes, but is not limited to, dehydration, anemia, infection, ACS, arrhythmia, ICH  Patient's presentation is most consistent with acute presentation with potential threat to life or bodily function.   The patient is on the cardiac monitor to evaluate for evidence of arrhythmia and/or significant heart rate changes.  Patient presented to the emergency department today because of concerns for multiple falls today as  well as altered mental status.  On exam patient does have some confusion.  CT head and cervical spine without acute concerning traumatic injuries.  Blood work without significant leukocytosis.  However did show elevated CK. Creatinine normal at this time. Initial troponin minimally elevated, no previous troponin in EHR. EKG without st elevation. Given findings concerning for rhabdo and AMS do feel patient would benefit from further work up and evaluation. Will discuss with the hospitalist for admission.  Discussed with Dr. Achilles Holes with the hospitalist service.       FINAL CLINICAL IMPRESSION(S) / ED DIAGNOSES   Final diagnoses:  Fall, initial encounter  Elevated CK  Altered mental status, unspecified altered mental status type     Note:  This document was prepared using Dragon voice recognition software and may include unintentional dictation errors.]    Marylynn Soho, MD 08/02/23 2952    Marylynn Soho, MD 08/02/23 2328

## 2023-08-03 ENCOUNTER — Inpatient Hospital Stay

## 2023-08-03 DIAGNOSIS — M6282 Rhabdomyolysis: Secondary | ICD-10-CM | POA: Diagnosis not present

## 2023-08-03 DIAGNOSIS — G9341 Metabolic encephalopathy: Secondary | ICD-10-CM

## 2023-08-03 DIAGNOSIS — F32A Depression, unspecified: Secondary | ICD-10-CM | POA: Insufficient documentation

## 2023-08-03 DIAGNOSIS — E119 Type 2 diabetes mellitus without complications: Secondary | ICD-10-CM

## 2023-08-03 DIAGNOSIS — I160 Hypertensive urgency: Secondary | ICD-10-CM

## 2023-08-03 DIAGNOSIS — K219 Gastro-esophageal reflux disease without esophagitis: Secondary | ICD-10-CM | POA: Insufficient documentation

## 2023-08-03 LAB — CBC
HCT: 42.2 % (ref 36.0–46.0)
Hemoglobin: 14.2 g/dL (ref 12.0–15.0)
MCH: 30.2 pg (ref 26.0–34.0)
MCHC: 33.6 g/dL (ref 30.0–36.0)
MCV: 89.8 fL (ref 80.0–100.0)
Platelets: 182 10*3/uL (ref 150–400)
RBC: 4.7 MIL/uL (ref 3.87–5.11)
RDW: 12.8 % (ref 11.5–15.5)
WBC: 10.3 10*3/uL (ref 4.0–10.5)
nRBC: 0 % (ref 0.0–0.2)

## 2023-08-03 LAB — URINALYSIS, COMPLETE (UACMP) WITH MICROSCOPIC
Bacteria, UA: NONE SEEN
Bilirubin Urine: NEGATIVE
Glucose, UA: >=500 mg/dL — AB
Ketones, ur: 5 mg/dL — AB
Leukocytes,Ua: NEGATIVE
Nitrite: NEGATIVE
Protein, ur: 30 mg/dL — AB
RBC / HPF: 0 RBC/hpf (ref 0–5)
Specific Gravity, Urine: 1.027 (ref 1.005–1.030)
Squamous Epithelial / HPF: 0 /HPF (ref 0–5)
pH: 5 (ref 5.0–8.0)

## 2023-08-03 LAB — BASIC METABOLIC PANEL WITH GFR
Anion gap: 12 (ref 5–15)
BUN: 27 mg/dL — ABNORMAL HIGH (ref 8–23)
CO2: 24 mmol/L (ref 22–32)
Calcium: 9.9 mg/dL (ref 8.9–10.3)
Chloride: 100 mmol/L (ref 98–111)
Creatinine, Ser: 0.6 mg/dL (ref 0.44–1.00)
GFR, Estimated: >60 mL/min (ref >60)
Glucose, Bld: 229 mg/dL — ABNORMAL HIGH (ref 70–99)
Potassium: 3.8 mmol/L (ref 3.5–5.1)
Sodium: 136 mmol/L (ref 135–145)

## 2023-08-03 LAB — GLUCOSE, CAPILLARY
Glucose-Capillary: 381 mg/dL — ABNORMAL HIGH (ref 70–99)
Glucose-Capillary: 275 mg/dL — ABNORMAL HIGH (ref 70–99)
Glucose-Capillary: 208 mg/dL — ABNORMAL HIGH (ref 70–99)

## 2023-08-03 LAB — TROPONIN I (HIGH SENSITIVITY): Troponin I (High Sensitivity): 42 ng/L — ABNORMAL HIGH (ref <18)

## 2023-08-03 LAB — CK: Total CK: 3218 U/L — ABNORMAL HIGH (ref 38–234)

## 2023-08-03 LAB — HEMOGLOBIN A1C
Hgb A1c MFr Bld: 8.1 % — ABNORMAL HIGH (ref 4.8–5.6)
Mean Plasma Glucose: 185.77 mg/dL

## 2023-08-03 MED ORDER — SENNA 8.6 MG PO TABS
1.0000 | ORAL_TABLET | Freq: Every day | ORAL | Status: DC
  Administered 2023-08-05 – 2023-08-06 (×2): 8.6 mg via ORAL
  Filled 2023-08-03 (×4): qty 1

## 2023-08-03 MED ORDER — INSULIN ASPART 100 UNIT/ML IJ SOLN
0.0000 [IU] | Freq: Three times a day (TID) | INTRAMUSCULAR | Status: DC
  Administered 2023-08-03: 9 [IU] via SUBCUTANEOUS
  Administered 2023-08-03: 5 [IU] via SUBCUTANEOUS
  Administered 2023-08-04 (×2): 2 [IU] via SUBCUTANEOUS
  Administered 2023-08-04: 1 [IU] via SUBCUTANEOUS
  Administered 2023-08-05 (×2): 3 [IU] via SUBCUTANEOUS
  Administered 2023-08-05: 7 [IU] via SUBCUTANEOUS
  Administered 2023-08-06: 2 [IU] via SUBCUTANEOUS
  Administered 2023-08-06: 5 [IU] via SUBCUTANEOUS
  Administered 2023-08-06: 3 [IU] via SUBCUTANEOUS
  Administered 2023-08-07 (×2): 5 [IU] via SUBCUTANEOUS
  Filled 2023-08-03 (×12): qty 1

## 2023-08-03 NOTE — Hospital Course (Signed)
 78 y.o. Caucasian female with medical history significant for CHF, CKD, type 2 diabetes mellitus, GERD, hypertension, dyslipidemia, OSA and osteoarthritis, presented to the emergency room with acute onset of altered mental status with confusion.   Assessment and plan:   Acute metabolic encephalopathy - Likely multifactorial given patient's dehydration, rhabdomyolysis.  Continues to be confused.  Will continue to monitor closely.  CT head negative.   Rhabdomyolysis - Possibly secondary to recurrent falls and patient.  CK this morning only moderately improved, 3218 down from 3382.  Aggressive IV fluid hydration on board, NS at 150 mL/h.  Monitor urine output and recheck kidney function and magnesium in AM.   Hypertensive urgency - Appears to be resolved.  Will monitor blood pressure closely.  Working with pharmacy on medical reconciliation.   Chronic HFpEF - Does not appear to be in acute exacerbation.  Monitor respiratory function given aggressive IV fluid hydration.   CKD 3 - Creatinine appears at baseline.   Depression - Continue Celexa   GERD - PPI on board.   Diabetes mellitus - Glargine 18 units.  Insulin  sliding scale on board.   Physical debilitation muscle weakness - Once encephalopathy resolved, will order PT.

## 2023-08-03 NOTE — Progress Notes (Signed)
   08/03/23 1800  Spiritual Encounters  Type of Visit Initial  Care provided to: Patient  Referral source Physician  Reason for visit Grief/loss  OnCall Visit Yes   Chaplain responded to [facility] consult saying patient with suicidal thoughts. This Chaplain discussed this with the patient who understands that her time is not yet over and that God has not called her home yet, although to she would love to be with her husband. She expressed the need to remain here for her son who loves her and she loves her son. She is struggling with mobility and asked for prayer that she does not fall. This Chaplain allowed her to talk and explain her story.

## 2023-08-03 NOTE — Progress Notes (Signed)
 Progress Note   Patient: Emma Guzman ZOX:[id number] DOB: [birth year] DOA: 08/02/2023     1 DOS: the patient was seen and examined on 08/03/2023   Brief hospital course:  78 y.o. Caucasian female with medical history significant for CHF, CKD, type 2 diabetes mellitus, GERD, hypertension, dyslipidemia, OSA and osteoarthritis, presented to the emergency room with acute onset of altered mental status with confusion.  Assessment and plan:  Acute metabolic encephalopathy - Likely multifactorial given patient's dehydration, rhabdomyolysis.  Continues to be confused.  Will continue to monitor closely.  CT head negative.  Rhabdomyolysis - Possibly secondary to recurrent falls and patient.  CK this morning only moderately improved, 3218 down from 3382.  Aggressive IV fluid hydration on board, NS at 150 mL/h.  Monitor urine output and recheck kidney function and magnesium in AM.  Hypertensive urgency - Appears to be resolved.  Will monitor blood pressure closely.  Working with pharmacy on medical reconciliation.  Chronic HFpEF - Does not appear to be in acute exacerbation.  Monitor respiratory function given aggressive IV fluid hydration.  CKD 3 - Creatinine appears at baseline.  Depression - Continue Celexa  GERD - PPI on board.  Diabetes mellitus - Glargine 18 units.  Insulin  sliding scale on board.  Physical debilitation muscle weakness - Once encephalopathy resolved, will order PT.   Subjective: Patient resting comfortably but encephalopathic.  Not responding to questions or following commands.  Mostly lethargic and minimally rousable.  Blood pressure stable, breathing comfortably on room air.  Tachycardia resolved.  Physical Exam: Vitals:   08/03/23 0200 08/03/23 0304 08/03/23 0329 08/03/23 0816  BP: (!) 170/94 118/75 (!) 153/95 132/78  Pulse: 99 94 97 91  Resp: 11 12 17 16   Temp:   98.4 F (36.9 C) 98.6 F (37 C)  TempSrc:   Oral   SpO2: 97% 97% 100% 100%  Weight:       Height:        GENERAL: Lethargic but rousable, frail HEENT:  EOMI CARDIOVASCULAR:  RRR, no murmurs appreciated RESPIRATORY:  Clear to auscultation, no wheezing, rales, or rhonchi GASTROINTESTINAL:  Soft, nontender, nondistended EXTREMITIES: Thin, no LE edema bilaterally NEURO: Moving extremities SKIN: Dry, no rashes noted PSYCH: Encephalopathic    Data Reviewed:  No new imaging to review  Labs: CBC: Recent Labs  Lab 08/02/23 1845 08/03/23 0405  WBC 10.3 10.3  HGB 15.5* 14.2  HCT 45.4 42.2  MCV 89.4 89.8  PLT 184 182   Basic Metabolic Panel: Recent Labs  Lab 08/02/23 1845 08/03/23 0405  NA 134* 136  K 3.5 3.8  CL 96* 100  CO2 20* 24  GLUCOSE 332* 229*  BUN 25* 27*  CREATININE 0.69 0.60  CALCIUM 10.1 9.9   Liver Function Tests: Recent Labs  Lab 08/02/23 1845  AST 63*  ALT 26  ALKPHOS 52  BILITOT 1.7*  PROT 7.6  ALBUMIN 4.2   CBG: No results for input(s): "GLUCAP" in the last 168 hours.  Scheduled Meds:  enoxaparin  (LOVENOX ) injection  40 mg Subcutaneous Q24H   insulin  glargine-yfgn  18 Units Subcutaneous QHS   pantoprazole   40 mg Oral Daily   [START ON 08/04/2023] polyethylene glycol  17 g Oral Daily   senna  1 tablet Oral QHS   Continuous Infusions:  sodium chloride  150 mL/hr at 08/03/23 1007   PRN Meds:.acetaminophen  **OR** acetaminophen , magnesium hydroxide, ondansetron  **OR** ondansetron  (ZOFRAN ) IV, traZODone  Family Communication: None at bedside, son will be here tomorrow.  Disposition: Status is:  Inpatient Remains inpatient appropriate because: Rhabdomyolysis with acute encephalopathy     Time spent: 37 minutes  Author: Jodeane Mulligan, DO 08/03/2023 10:49 AM  For on call review [email or web address].

## 2023-08-03 NOTE — Assessment & Plan Note (Addendum)
-   The patient will be placed on supplemental coverage with NovoLog . - Will continue basal coverage and Glucotrol XL.

## 2023-08-03 NOTE — Progress Notes (Signed)
 Pt unable to answer admission questions at this time r/t confusion.  Son should be back tomorrow to speak with staff.  Pt comfortable at this time with no requests. Bed alarm on and call light within reach.

## 2023-08-03 NOTE — Evaluation (Addendum)
 Occupational Therapy Evaluation Patient Details Name: Emma Guzman MRN: [id number] DOB: 01/13/1946 Today's Date: 08/03/2023   History of Present Illness   Pt is a 78 y.o. female presented to ED with acute onset of AMS with confusion, recurrent falls without injuries, urinary frequency and urgency with dysuria, aching all over. PMH of CHF, CKD, type 2 diabetes mellitus, GERD, hypertension, dyslipidemia, OSA and osteoarthritis.     Clinical Impressions Pt was seen for OT evaluation this date. PTA, pt resides in a ground level apartment alone. Reports her son checks in twice a day, brings her meals, and vacuums the home. She reports increasing difficulty with all tasks over the last several months, sponge bathes and had stopped doing that much d/t being unable. Mult falls in the last several months reported with 3 in one day leading to this admission.  Pt presents to acute OT demonstrating impaired ADL performance and functional mobility 2/2 weakness, pain, balance deficits and low activity tolerance. C/o 6/10 L hip pain during session-nurse messaged MD regarding possible need for imaging. Sensitive to touch on LUE with pacemaker present. Pt currently requires Mod/MAX A for bed mobility tasks with increased time and step by step cueing. Pt appears slightly anxious with difficulty multi-tasking. Follows one step commands with increased time. UB dressing performed at EOB with Mod A. Pt performed x2 STS trials from EOB at elevated height with Mod A to RW for nurse to assess her buttocks and perform peri-care. Total assist needed for peri-care with Min A to maintain standing balance at RW. Seated rest break as pt became dizzy after standing ~2 mins. Stood additional trial to take a few side steps to [facility] prior to returning to supine with Max. Pt would benefit from skilled OT services to address noted impairments and functional limitations to maximize safety and independence while minimizing falls risk and  caregiver burden. Do anticipate the need for follow up OT services upon acute hospital DC.      If plan is discharge home, recommend the following:   A lot of help with walking and/or transfers;A lot of help with bathing/dressing/bathroom;Help with stairs or ramp for entrance     Functional Status Assessment   Patient has had a recent decline in their functional status and demonstrates the ability to make significant improvements in function in a reasonable and predictable amount of time.     Equipment Recommendations   Other (comment) (defer)     Recommendations for Other Services         Precautions/Restrictions   Precautions Precautions: Fall Restrictions Weight Bearing Restrictions Per Provider Order: No     Mobility Bed Mobility Overal bed mobility: Needs Assistance Bed Mobility: Supine to Sit, Sit to Supine     Supine to sit: Mod assist, HOB elevated, Used rails, Max assist Sit to supine: Max assist   General bed mobility comments: increased time/effort, L hip pain limiting    Transfers Overall transfer level: Needs assistance Equipment used: Rolling walker (2 wheels) Transfers: Sit to/from Stand Sit to Stand: Mod assist, From elevated surface           General transfer comment: bed height elevated, increased time/cueing, pt is anxious and peculiar about L side where pacemaker is; increased time/cueing for safety and taking lateral steps to [facility]; stood for ~2-3 mins for LB bathing prior to becoming dizzy and needing rest break; additional stand to lateral step to [facility]      Balance Overall balance assessment: Needs assistance Sitting-balance support:  Feet supported, Bilateral upper extremity supported Sitting balance-Leahy Scale: Fair   Postural control: Posterior lean Standing balance support: Reliant on assistive device for balance, Bilateral upper extremity supported Standing balance-Leahy Scale: Poor Standing balance comment: RW and  external support to maintain balance                           ADL either performed or assessed with clinical judgement   ADL Overall ADL's : Needs assistance/impaired         Upper Body Bathing: Contact guard assist;Sitting   Lower Body Bathing: Maximal assistance;Sit to/from stand;Total assistance   Upper Body Dressing : Moderate assistance;Sitting;Minimal assistance                           Vision         Perception         Praxis         Pertinent Vitals/Pain Pain Assessment Pain Assessment: 0-10 Pain Score: 6  Pain Location: L hip and L elbow Pain Descriptors / Indicators: Sore, Grimacing, Guarding Pain Intervention(s): Monitored during session, Repositioned, Limited activity within patient's tolerance (RN to message MD)     Extremity/Trunk Assessment Upper Extremity Assessment Upper Extremity Assessment: Generalized weakness;LUE deficits/detail LUE Deficits / Details: has pacemaker on L side and is very sensitive to touch on that side   Lower Extremity Assessment Lower Extremity Assessment: Generalized weakness       Communication Communication Communication: No apparent difficulties   Cognition Arousal: Alert Behavior During Therapy: WFL for tasks assessed/performed Cognition: No apparent impairments                               Following commands: Intact       Cueing  General Comments   Cueing Techniques: Verbal cues;Gestural cues;Tactile cues  HR 116 seated EOB   Exercises Other Exercises Other Exercises: Edu on role of OT in acute setting.   Shoulder Instructions      Home Living Family/patient expects to be discharged to:: Skilled nursing facility Living Arrangements: Alone Available Help at Discharge: Family;Available PRN/intermittently (son checks on her twice a day) Type of Home: Apartment Home Access: Level entry     Home Layout: One level     Bathroom Shower/Tub: Tub/shower  unit;Sponge bathes at baseline   Bathroom Toilet: Handicapped height Bathroom Accessibility: Yes How Accessible: Accessible via walker Home Equipment: Cane - single point;Rollator (4 wheels);Grab bars - tub/shower;Tub bench;Grab bars - toilet          Prior Functioning/Environment Prior Level of Function : History of Falls (last six months)             Mobility Comments: rollator use; household distances; 3 falls same day and more prior to that ADLs Comments: Assist from family with IALDs and medication management; reports increased difficulty with ADLs over last several months with mult falls    OT Problem List: Decreased strength;Decreased activity tolerance;Impaired balance (sitting and/or standing)   OT Treatment/Interventions: Self-care/ADL training;Balance training;Therapeutic exercise;Therapeutic activities;DME and/or AE instruction;Patient/family education      OT Goals(Current goals can be found in the care plan section)   Acute Rehab OT Goals Patient Stated Goal: improve pain/function OT Goal Formulation: With patient Time For Goal Achievement: 08/17/23 Potential to Achieve Goals: Fair ADL Goals Pt Will Perform Lower Body Bathing: with min assist;sitting/lateral leans;sit to/from  stand Pt Will Perform Lower Body Dressing: with min assist;sit to/from stand;sitting/lateral leans Pt Will Transfer to Toilet: with min assist;ambulating;regular height toilet;bedside commode Pt Will Perform Toileting - Clothing Manipulation and hygiene: with min assist;sitting/lateral leans;sit to/from stand   OT Frequency:  Min 2X/week    Co-evaluation              AM-PAC OT "6 Clicks" Daily Activity     Outcome Measure Help from another person eating meals?: None Help from another person taking care of personal grooming?: A Little Help from another person toileting, which includes using toliet, bedpan, or urinal?: A Lot Help from another person bathing (including washing,  rinsing, drying)?: A Lot Help from another person to put on and taking off regular upper body clothing?: A Little Help from another person to put on and taking off regular lower body clothing?: A Lot 6 Click Score: 16   End of Session Equipment Utilized During Treatment: Rolling walker (2 wheels);Gait belt Nurse Communication: Mobility status  Activity Tolerance: Patient tolerated treatment well Patient left: in bed;with call bell/phone within reach;with nursing/sitter in room  OT Visit Diagnosis: Other abnormalities of gait and mobility (R26.89);Muscle weakness (generalized) (M62.81);Repeated falls (R29.6)                Time: 0454-0981 OT Time Calculation (min): 25 min Charges:  OT General Charges $OT Visit: 1 Visit OT Evaluation $OT Eval Moderate Complexity: 1 Mod OT Treatments $Self Care/Home Management : 8-22 mins Shannah Conteh, OTR/L 08/03/23, 12:47 PM  Latica Hohmann E Abigaile Rossie 08/03/2023, 12:40 PM

## 2023-08-03 NOTE — Assessment & Plan Note (Signed)
 Continue PPI therapy.

## 2023-08-03 NOTE — Assessment & Plan Note (Signed)
-   Will continue antihypertensive therapy and place on as needed IV labetalol.

## 2023-08-03 NOTE — Assessment & Plan Note (Signed)
Will continue Celexa

## 2023-08-03 NOTE — Plan of Care (Signed)
  Problem: Clinical Measurements: Goal: Ability to maintain clinical measurements within normal limits will improve Outcome: Progressing   Problem: Elimination: Goal: Will not experience complications related to bowel motility Outcome: Progressing Goal: Will not experience complications related to urinary retention Outcome: Progressing   Problem: Pain Managment: Goal: General experience of comfort will improve and/or be controlled Outcome: Progressing   Problem: Safety: Goal: Ability to remain free from injury will improve Outcome: Progressing   Problem: Skin Integrity: Goal: Risk for impaired skin integrity will decrease Outcome: Progressing

## 2023-08-03 NOTE — ED Notes (Signed)
 Meds not verified. Pharmacy messaged to verify.

## 2023-08-03 NOTE — Evaluation (Signed)
 Physical Therapy Evaluation Patient Details Name: Emma Guzman MRN: [id number] DOB: [birth year] Today's Date: 08/03/2023  History of Present Illness  Pt is a 78 y.o. female presented to ED with acute onset of AMS with confusion, recurrent falls without injuries, urinary frequency and urgency with dysuria, aching all over. PMH of CHF, CKD, type 2 diabetes mellitus, GERD, hypertension, dyslipidemia, OSA and osteoarthritis.   Clinical Impression  Pt required extensive encouragement and motivation to participate during the session.  Pt perseverated on wishing to die to be with her husband and thus seeing no point in participating with PT services but with encouragement was willing to proceed and put forth fair effort during session.  Pt required extensive physical assistance with bed mobility tasks and transfers and once in standing required near constant physical assist to prevent LOB.  Pt was unable to advance either LE during attempt to side step near the EOB.  Pt reported general body pain mostly in her R knee and ankle but also in her LLE, SpO2 and HR WNL throughout the session on room air. Pt is at a very high risk for further falls and will benefit from continued PT services upon discharge to safely address deficits listed in patient problem list for decreased caregiver assistance and eventual return to PLOF.         If plan is discharge home, recommend the following: Two people to help with walking and/or transfers;A lot of help with bathing/dressing/bathroom;Assistance with cooking/housework;Direct supervision/assist for medications management;Assist for transportation;Help with stairs or ramp for entrance   Can travel by private vehicle   No    Equipment Recommendations Other (comment) (TBD)  Recommendations for Other Services       Functional Status Assessment Patient has had a recent decline in their functional status and/or demonstrates limited ability to make significant  improvements in function in a reasonable and predictable amount of time     Precautions / Restrictions Precautions Precautions: Fall Restrictions Weight Bearing Restrictions Per Provider Order: No      Mobility  Bed Mobility Overal bed mobility: Needs Assistance Bed Mobility: Supine to Sit, Sit to Supine     Supine to sit: HOB elevated, Used rails, Max assist Sit to supine: Max assist   General bed mobility comments: Max A for BLE and trunk control    Transfers Overall transfer level: Needs assistance Equipment used: Rolling walker (2 wheels) Transfers: Sit to/from Stand Sit to Stand: Max assist, From elevated surface           General transfer comment: Mod verbal cues for hand and foot placement and for increased trunk flexion    Ambulation/Gait               General Gait Details: Unable to advance either LE in standing  Stairs            Wheelchair Mobility     Tilt Bed    Modified Rankin (Stroke Patients Only)       Balance Overall balance assessment: Needs assistance, History of Falls Sitting-balance support: Feet supported, Bilateral upper extremity supported Sitting balance-Leahy Scale: Fair     Standing balance support: Reliant on assistive device for balance, Bilateral upper extremity supported Standing balance-Leahy Scale: Poor                               Pertinent Vitals/Pain Pain Assessment Pain Assessment: 0-10 Pain Score: 6  Pain Location: R knee  and ankle Pain Descriptors / Indicators: Sore, Aching Pain Intervention(s): Repositioned, Premedicated before session, Monitored during session    Home Living Family/patient expects to be discharged to:: Skilled nursing facility Living Arrangements: Alone Available Help at Discharge: Family;Available PRN/intermittently Type of Home: Apartment Home Access: Level entry       Home Layout: One level Home Equipment: Cane - single point;Rollator (4 wheels);Grab  bars - tub/shower;Tub bench;Grab bars - toilet Additional Comments: Son checks in on pt 2x/day    Prior Function Prior Level of Function : History of Falls (last six months)             Mobility Comments: Mod Ind amb with a RW household distances, multiple falls in the last 6 months including 3 falls in one day just prior to admission ADLs Comments: Assist from family with IALDs and medication management; reports increased difficulty with ADLs over last several months with mult falls     Extremity/Trunk Assessment   Upper Extremity Assessment Upper Extremity Assessment: Defer to OT evaluation    Lower Extremity Assessment Lower Extremity Assessment: Generalized weakness       Communication   Communication Communication: Impaired Factors Affecting Communication: Hearing impaired    Cognition Arousal: Alert Behavior During Therapy: WFL for tasks assessed/performed   PT - Cognitive impairments: No apparent impairments                       PT - Cognition Comments: Pt depressed and frequently mentioned being ready to die, MD notified Following commands: Intact       Cueing Cueing Techniques: Verbal cues, Gestural cues, Tactile cues     General Comments      Exercises Other Exercises Other Exercises: Static sitting at EOB x 8 min for improved core strength and activity tolerance   Assessment/Plan    PT Assessment Patient needs continued PT services  PT Problem List Decreased strength;Decreased activity tolerance;Decreased balance;Decreased mobility;Decreased knowledge of use of DME;Pain       PT Treatment Interventions DME instruction;Gait training;Functional mobility training;Therapeutic activities;Therapeutic exercise;Balance training;Patient/family education    PT Goals (Current goals can be found in the Care Plan section)  Acute Rehab PT Goals Patient Stated Goal: To be with my husband PT Goal Formulation: With patient Time For Goal Achievement:  08/16/23 Potential to Achieve Goals: Fair    Frequency Min 2X/week     Co-evaluation               AM-PAC PT "6 Clicks" Mobility  Outcome Measure Help needed turning from your back to your side while in a flat bed without using bedrails?: A Lot Help needed moving from lying on your back to sitting on the side of a flat bed without using bedrails?: Total Help needed moving to and from a bed to a chair (including a wheelchair)?: Total Help needed standing up from a chair using your arms (e.g., wheelchair or bedside chair)?: A Lot Help needed to walk in hospital room?: Total Help needed climbing 3-5 steps with a railing? : Total 6 Click Score: 8    End of Session Equipment Utilized During Treatment: Gait belt Activity Tolerance: Patient tolerated treatment well Patient left: in bed;with call bell/phone within reach;with bed alarm set;with nursing/sitter in room Nurse Communication: Mobility status (Nursing/MD notified of pt perseverating on wanting to die during the session) PT Visit Diagnosis: Unsteadiness on feet (R26.81);History of falling (Z91.81);Difficulty in walking, not elsewhere classified (R26.2);Muscle weakness (generalized) (M62.81);Pain Pain - Right/Left: Right (  Bilateral) Pain - part of body: Hip;Knee;Leg;Ankle and joints of foot    Time: 4098-1191 PT Time Calculation (min) (ACUTE ONLY): 17 min   Charges:   PT Evaluation $PT Eval Moderate Complexity: 1 Mod   PT General Charges $$ ACUTE PT VISIT: 1 Visit       D. Madalyn Scarce PT, DPT 08/03/23, 4:47 PM

## 2023-08-03 NOTE — Assessment & Plan Note (Signed)
-   This is likely secondary to her recurrent falls. - The patient will be admitted to a medical telemetry bed. - Will continue aggressive hydration with IV normal saline. - Will follow CK levels. - Pain management will be provided.

## 2023-08-04 DIAGNOSIS — F329 Major depressive disorder, single episode, unspecified: Secondary | ICD-10-CM

## 2023-08-04 DIAGNOSIS — M6282 Rhabdomyolysis: Secondary | ICD-10-CM | POA: Diagnosis not present

## 2023-08-04 LAB — CBC
HCT: 35 % — ABNORMAL LOW (ref 36.0–46.0)
Hemoglobin: 11.9 g/dL — ABNORMAL LOW (ref 12.0–15.0)
MCH: 30.6 pg (ref 26.0–34.0)
MCHC: 34 g/dL (ref 30.0–36.0)
MCV: 90 fL (ref 80.0–100.0)
Platelets: 149 10*3/uL — ABNORMAL LOW (ref 150–400)
RBC: 3.89 MIL/uL (ref 3.87–5.11)
RDW: 13.2 % (ref 11.5–15.5)
WBC: 7.8 10*3/uL (ref 4.0–10.5)
nRBC: 0 % (ref 0.0–0.2)

## 2023-08-04 LAB — COMPREHENSIVE METABOLIC PANEL WITH GFR
ALT: 23 U/L (ref 0–44)
AST: 45 U/L — ABNORMAL HIGH (ref 15–41)
Albumin: 3.2 g/dL — ABNORMAL LOW (ref 3.5–5.0)
Alkaline Phosphatase: 39 U/L (ref 38–126)
Anion gap: 6 (ref 5–15)
BUN: 20 mg/dL (ref 8–23)
CO2: 25 mmol/L (ref 22–32)
Calcium: 9.5 mg/dL (ref 8.9–10.3)
Chloride: 107 mmol/L (ref 98–111)
Creatinine, Ser: 0.52 mg/dL (ref 0.44–1.00)
GFR, Estimated: >60 mL/min (ref >60)
Glucose, Bld: 176 mg/dL — ABNORMAL HIGH (ref 70–99)
Potassium: 3.7 mmol/L (ref 3.5–5.1)
Sodium: 138 mmol/L (ref 135–145)
Total Bilirubin: 0.8 mg/dL (ref 0.0–1.2)
Total Protein: 5.9 g/dL — ABNORMAL LOW (ref 6.5–8.1)

## 2023-08-04 LAB — CK: Total CK: 1039 U/L — ABNORMAL HIGH (ref 38–234)

## 2023-08-04 LAB — MAGNESIUM: Magnesium: 1.5 mg/dL — ABNORMAL LOW (ref 1.7–2.4)

## 2023-08-04 LAB — GLUCOSE, CAPILLARY
Glucose-Capillary: 147 mg/dL — ABNORMAL HIGH (ref 70–99)
Glucose-Capillary: 196 mg/dL — ABNORMAL HIGH (ref 70–99)
Glucose-Capillary: 180 mg/dL — ABNORMAL HIGH (ref 70–99)
Glucose-Capillary: 246 mg/dL — ABNORMAL HIGH (ref 70–99)

## 2023-08-04 LAB — PHOSPHORUS: Phosphorus: 1.8 mg/dL — ABNORMAL LOW (ref 2.5–4.6)

## 2023-08-04 MED ORDER — MAGNESIUM OXIDE -MG SUPPLEMENT 400 (240 MG) MG PO TABS
400.0000 mg | ORAL_TABLET | Freq: Every day | ORAL | Status: DC
  Administered 2023-08-04 – 2023-08-05 (×2): 400 mg via ORAL
  Filled 2023-08-04 (×2): qty 1

## 2023-08-04 MED ORDER — K PHOS MONO-SOD PHOS DI & MONO 155-852-130 MG PO TABS
500.0000 mg | ORAL_TABLET | Freq: Once | ORAL | Status: AC
  Administered 2023-08-04: 500 mg via ORAL
  Filled 2023-08-04: qty 2

## 2023-08-04 NOTE — Plan of Care (Signed)

## 2023-08-04 NOTE — TOC Initial Note (Signed)
 Transition of Care [facility]) - Initial/Assessment Note    Patient Details  Name: Emma Guzman MRN: [id number] Date of Birth: [birth year]  Transition of Care [facility]) CM/SW Contact:    Alexandra Ice, RN Phone Number: 08/04/2023, 1:48 PM  Clinical Narrative:                 Patient lives alone, son is checks on her twice per day. She has PCP. PT recommends SNF for additional rehab prior to returning home. PASRR, FL2 and bedsearch need to be completed if family is agreeable.   Expected Discharge Plan: Skilled Nursing Facility Barriers to Discharge: Continued Medical Work up   Patient Goals and CMS Choice            Expected Discharge Plan and Services       Living arrangements for the past 2 months: Apartment                   DME Agency: NA       HH Arranged: NA          Prior Living Arrangements/Services Living arrangements for the past 2 months: Apartment Lives with:: Self Patient language and need for interpreter reviewed:: Yes Do you feel safe going back to the place where you live?: Yes      Need for Family Participation in Patient Care: Yes (Comment) Care giver support system in place?: Yes (comment) Current home services: DME (single point cane, rollator, grab-bars, shower bench) Criminal Activity/Legal Involvement Pertinent to Current Situation/Hospitalization: No - Comment as needed  Activities of Daily Living   ADL Screening (condition at time of admission) Independently performs ADLs?: No Does the patient have a NEW difficulty with bathing/dressing/toileting/self-feeding that is expected to last >3 days?: Yes (Initiates electronic notice to provider for possible OT consult) Does the patient have a NEW difficulty with getting in/out of bed, walking, or climbing stairs that is expected to last >3 days?: Yes (Initiates electronic notice to provider for possible PT consult) Does the patient have a NEW difficulty with communication that is expected to  last >3 days?: No Is the patient deaf or have difficulty hearing?: Yes Does the patient have difficulty seeing, even when wearing glasses/contacts?: No Does the patient have difficulty concentrating, remembering, or making decisions?: Yes  Permission Sought/Granted                  Emotional Assessment Appearance:: Appears stated age     Orientation: : Fluctuating Orientation (Suspected and/or reported Sundowners), Oriented to Self   Psych Involvement: No (comment)  Admission diagnosis:  Rhabdomyolysis [M62.82] Elevated CK [R74.8] Fall, initial encounter [W19.XXXA] Altered mental status, unspecified altered mental status type [R41.82] Patient Active Problem List   Diagnosis Date Noted   Hypertensive urgency 08/03/2023   Type 2 diabetes mellitus without complications (HCC) 08/03/2023   GERD without esophagitis 08/03/2023   Depression 08/03/2023   Rhabdomyolysis 08/02/2023   DKA (diabetic ketoacidosis) (HCC) 10/24/2022   Diabetes mellitus without complication (HCC) 10/24/2022   Elevated CK 10/24/2022   UTI (urinary tract infection) 10/24/2022   Elevated lactic acid level 10/24/2022   Fall at home, initial encounter 10/24/2022   Chronic systolic CHF (congestive heart failure) (HCC) 10/24/2022   Hypertension    Hypercholesteremia    Apnea, sleep 04/07/2014   Cardiac defibrillator in place 04/07/2014   Cardiomyopathy (HCC) 04/07/2014   Acid reflux 04/07/2014   Morbid obesity (HCC) 05/12/2008   CARDIOMYOPATHY, PRIMARY, DILATED 05/12/2008   LBBB 05/12/2008  SYSTOLIC HEART FAILURE, ACUTE ON CHRONIC 05/12/2008   Implantable cardioverter-defibrillator (ICD) in situ 05/12/2008   Automatic implantable cardioverter-defibrillator in situ 05/12/2008   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 [place] [address] Phone: [phone]  Fax: [phone]     Social Drivers of Health (SDOH) Social History: SDOH Screenings   Food Insecurity: Patient Unable To Answer (08/03/2023)  Housing: Patient Unable To Answer (08/03/2023)  Transportation Needs: Patient Unable To Answer (08/03/2023)  Utilities: Patient Unable To Answer (08/03/2023)  Financial Resource Strain: Low Risk  (09/15/2021)   Received from [facility]  Physical Activity: Inactive (09/15/2021)   Received from [facility]  Social Connections: Patient Unable To Answer (08/03/2023)  Stress: Stress Concern Present (09/15/2021)   Received from [facility]  Tobacco Use: Low Risk  (08/02/2023)   SDOH Interventions:     Readmission Risk Interventions     No data to display

## 2023-08-04 NOTE — Progress Notes (Addendum)
 Progress Note   Patient: Emma Guzman QIO:[id number] DOB: [birth year] DOA: 08/02/2023     2 DOS: the patient was seen and examined on 08/04/2023   Brief hospital course:  78 y.o. Caucasian female with medical history significant for CHF, CKD, type 2 diabetes mellitus, GERD, hypertension, dyslipidemia, OSA and osteoarthritis, presented to the emergency room with acute onset of altered mental status with confusion.   Assessment and plan:   Acute metabolic encephalopathy - Likely multifactorial given patient's dehydration, rhabdomyolysis.  CT head negative.  Showing marked improvement.  Appears to be resolved at this time.   Rhabdomyolysis - Possibly secondary to recurrent falls and patient.  CK showing continued improvement, 1039 down from 3382.  Aggressive IV fluid hydration on board, NS at 150 mL/h.  Monitor urine output and recheck kidney function and magnesium in AM.  Hypomagnesemia/hypophosphatemia - Replenishment on board.  Will recheck BMP, mag, Phos in AM.   Hypertensive urgency - Appears to be resolved.  Will monitor blood pressure closely.  Working with pharmacy on medical reconciliation.   Chronic HFpEF - Does not appear to be in acute exacerbation.  Monitor respiratory function given aggressive IV fluid hydration.   CKD 3 - Creatinine appears at baseline.   Depression - Continue Celexa   GERD - PPI on board.   Diabetes mellitus - Glargine 18 units.  Insulin  sliding scale on board.   Physical debilitation muscle weakness - PT/OT on board.  Recommending STR.  Will talk with patient, son, TOC on disposition planning.   Subjective: Patient much more alert and talkative this morning compared to yesterday morning.  Was able to work with physical therapy.  Had complaints about left hip pain, imaging negative.  Had a discussion with chaplain yesterday as the patient was expressing some end-of-life/suicidal type thoughts however this morning patient states that she feels  well and had a good discussion.  Currently denies shortness of breath, chest pain, nausea, vomiting abdominal pain.  Chest extremity pain all over.  Worked with physical therapy and Occupational Therapy yesterday.  Physical Exam: Vitals:   08/03/23 1546 08/03/23 2058 08/04/23 0410 08/04/23 0758  BP: 112/65 136/75 132/79 (!) 162/82  Pulse: 93 87 83 79  Resp: 16 20 18 16   Temp: 98.6 F (37 C) 98.7 F (37.1 C) 98.7 F (37.1 C) 98.2 F (36.8 C)  TempSrc:   Oral Oral  SpO2: 100% 99% 97% 97%  Weight:      Height:        GENERAL:  Alert, pleasant, no acute distress, frail/cachectic HEENT:  EOMI CARDIOVASCULAR:  RRR, no murmurs appreciated RESPIRATORY:  Clear to auscultation, no wheezing, rales, or rhonchi GASTROINTESTINAL:  Soft, nontender, nondistended EXTREMITIES:  No LE edema bilaterally NEURO:  No new focal deficits appreciated SKIN:  No rashes noted PSYCH:  Appropriate mood and affect     Data Reviewed:  Left fever imaging personally reviewed noting no obvious fracture.  Labs: CBC: Recent Labs  Lab 08/02/23 1845 08/03/23 0405 08/04/23 0351  WBC 10.3 10.3 7.8  HGB 15.5* 14.2 11.9*  HCT 45.4 42.2 35.0*  MCV 89.4 89.8 90.0  PLT 184 182 149*   Basic Metabolic Panel: Recent Labs  Lab 08/02/23 1845 08/03/23 0405 08/04/23 0351  NA 134* 136 138  K 3.5 3.8 3.7  CL 96* 100 107  CO2 20* 24 25  GLUCOSE 332* 229* 176*  BUN 25* 27* 20  CREATININE 0.69 0.60 0.52  CALCIUM 10.1 9.9 9.5  MG  --   --  1.5*  PHOS  --   --  1.8*   Liver Function Tests: Recent Labs  Lab 08/02/23 1845 08/04/23 0351  AST 63* 45*  ALT 26 23  ALKPHOS 52 39  BILITOT 1.7* 0.8  PROT 7.6 5.9*  ALBUMIN 4.2 3.2*   CBG: Recent Labs  Lab 08/03/23 1205 08/03/23 1625 08/03/23 2101 08/04/23 0838  GLUCAP 381* 275* 208* 147*    Scheduled Meds:  enoxaparin  (LOVENOX ) injection  40 mg Subcutaneous Q24H   insulin  aspart  0-9 Units Subcutaneous TID WC   insulin  glargine-yfgn  18 Units  Subcutaneous QHS   magnesium oxide  400 mg Oral Daily   pantoprazole   40 mg Oral Daily   polyethylene glycol  17 g Oral Daily   senna  1 tablet Oral QHS   Continuous Infusions: PRN Meds:.acetaminophen  **OR** acetaminophen , magnesium hydroxide, ondansetron  **OR** ondansetron  (ZOFRAN ) IV, traZODone  Family Communication: None at bedside  Disposition: Status is: Inpatient Remains inpatient appropriate because: Rhabdomyolysis     Time spent: 35 minutes  Author: Jodeane Mulligan, DO 08/04/2023 11:43 AM  For on call review [email or web address].

## 2023-08-04 NOTE — Plan of Care (Signed)
  Problem: Education: Goal: Knowledge of General Education information will improve Description: Including pain rating scale, medication(s)/side effects and non-pharmacologic comfort measures Outcome: Progressing   Problem: Clinical Measurements: Goal: Ability to maintain clinical measurements within normal limits will improve Outcome: Progressing   Problem: Nutrition: Goal: Adequate nutrition will be maintained Outcome: Progressing   Problem: Elimination: Goal: Will not experience complications related to bowel motility Outcome: Progressing Goal: Will not experience complications related to urinary retention Outcome: Progressing   Problem: Pain Managment: Goal: General experience of comfort will improve and/or be controlled Outcome: Progressing   Problem: Safety: Goal: Ability to remain free from injury will improve Outcome: Progressing

## 2023-08-05 DIAGNOSIS — R531 Weakness: Secondary | ICD-10-CM | POA: Diagnosis not present

## 2023-08-05 DIAGNOSIS — M6282 Rhabdomyolysis: Secondary | ICD-10-CM | POA: Diagnosis not present

## 2023-08-05 LAB — GLUCOSE, CAPILLARY
Glucose-Capillary: 203 mg/dL — ABNORMAL HIGH (ref 70–99)
Glucose-Capillary: 332 mg/dL — ABNORMAL HIGH (ref 70–99)
Glucose-Capillary: 243 mg/dL — ABNORMAL HIGH (ref 70–99)
Glucose-Capillary: 243 mg/dL — ABNORMAL HIGH (ref 70–99)

## 2023-08-05 LAB — BASIC METABOLIC PANEL WITH GFR
Anion gap: 8 (ref 5–15)
BUN: 18 mg/dL (ref 8–23)
CO2: 26 mmol/L (ref 22–32)
Calcium: 9.4 mg/dL (ref 8.9–10.3)
Chloride: 102 mmol/L (ref 98–111)
Creatinine, Ser: 0.51 mg/dL (ref 0.44–1.00)
GFR, Estimated: >60 mL/min (ref >60)
Glucose, Bld: 248 mg/dL — ABNORMAL HIGH (ref 70–99)
Potassium: 4.1 mmol/L (ref 3.5–5.1)
Sodium: 136 mmol/L (ref 135–145)

## 2023-08-05 LAB — CBC
HCT: 35.1 % — ABNORMAL LOW (ref 36.0–46.0)
Hemoglobin: 11.9 g/dL — ABNORMAL LOW (ref 12.0–15.0)
MCH: 30.7 pg (ref 26.0–34.0)
MCHC: 33.9 g/dL (ref 30.0–36.0)
MCV: 90.7 fL (ref 80.0–100.0)
Platelets: 138 10*3/uL — ABNORMAL LOW (ref 150–400)
RBC: 3.87 MIL/uL (ref 3.87–5.11)
RDW: 12.9 % (ref 11.5–15.5)
WBC: 6.6 10*3/uL (ref 4.0–10.5)
nRBC: 0 % (ref 0.0–0.2)

## 2023-08-05 LAB — MAGNESIUM: Magnesium: 1.5 mg/dL — ABNORMAL LOW (ref 1.7–2.4)

## 2023-08-05 LAB — CK: Total CK: 452 U/L — ABNORMAL HIGH (ref 38–234)

## 2023-08-05 LAB — PHOSPHORUS: Phosphorus: 1.9 mg/dL — ABNORMAL LOW (ref 2.5–4.6)

## 2023-08-05 NOTE — Progress Notes (Signed)
 Progress Note   Patient: Emma Guzman:[id number] DOB: 10-09-1945 DOA: 08/02/2023     3 DOS: the patient was seen and examined on 08/05/2023   Brief hospital course:  78 y.o. Caucasian female with medical history significant for CHF, CKD, type 2 diabetes mellitus, GERD, hypertension, dyslipidemia, OSA and osteoarthritis, presented to the emergency room with acute onset of altered mental status with confusion.   Assessment and plan:   Acute metabolic encephalopathy - Likely multifactorial given patient's dehydration, rhabdomyolysis.  CT head negative.  Showing marked improvement.  Appears to be resolved at this time.   Rhabdomyolysis - Possibly secondary to recurrent falls and patient.  CK showing continued improvement, 452 down from 3382 on presentation.  Can DC IV fluid hydration.  Monitor urine output and recheck kidney function and magnesium in AM.   Hypomagnesemia/hypophosphatemia - Replenishment on board.  Will recheck BMP, mag, Phos in AM.   Hypertensive urgency - Appears to be resolved.  Will monitor blood pressure closely.  Working with pharmacy on medical reconciliation.   Chronic HFpEF - Does not appear to be in acute exacerbation.  Monitor respiratory function given aggressive IV fluid hydration.   CKD 3 - Creatinine appears at baseline.   Depression - Continue Celexa   GERD - PPI on board.   Diabetes mellitus - Glargine 18 units.  Insulin  sliding scale on board.   Physical debilitation muscle weakness - PT/OT on board.  Recommending STR.  Will talk with patient, son, TOC on disposition planning.   Subjective: Patient resting comfortably this morning.  Continues to complain of mild diffuse bodyaches but otherwise resting well.  Denies any shortness of breath, chest pain, nausea, vomiting, abdominal pain.  Physical Exam: Vitals:   08/04/23 2059 08/05/23 0437 08/05/23 0700 08/05/23 0830  BP: 132/88 (!) 148/85  (!) 145/84  Pulse: 87 81 85   Resp: 18 18 18     Temp: 98.3 F (36.8 C) 98.5 F (36.9 C) 98 F (36.7 C)   TempSrc:  Oral Oral   SpO2: 97% 99% 98%   Weight:      Height:        GENERAL:  Alert, pleasant, no acute distress, frail/cachectic HEENT:  EOMI CARDIOVASCULAR:  RRR, no murmurs appreciated RESPIRATORY:  Clear to auscultation, no wheezing, rales, or rhonchi GASTROINTESTINAL:  Soft, nontender, nondistended EXTREMITIES:  No LE edema bilaterally NEURO:  No new focal deficits appreciated SKIN:  No rashes noted PSYCH:  Appropriate mood and affect    Data Reviewed:  No new imaging  Labs: CBC: Recent Labs  Lab 08/02/23 1845 08/03/23 0405 08/04/23 0351 08/05/23 0317  WBC 10.3 10.3 7.8 6.6  HGB 15.5* 14.2 11.9* 11.9*  HCT 45.4 42.2 35.0* 35.1*  MCV 89.4 89.8 90.0 90.7  PLT 184 182 149* 138*   Basic Metabolic Panel: Recent Labs  Lab 08/02/23 1845 08/03/23 0405 08/04/23 0351 08/05/23 0317  NA 134* 136 138 136  K 3.5 3.8 3.7 4.1  CL 96* 100 107 102  CO2 20* 24 25 26   GLUCOSE 332* 229* 176* 248*  BUN 25* 27* 20 18  CREATININE 0.69 0.60 0.52 0.51  CALCIUM 10.1 9.9 9.5 9.4  MG  --   --  1.5* 1.5*  PHOS  --   --  1.8* 1.9*   Liver Function Tests: Recent Labs  Lab 08/02/23 1845 08/04/23 0351  AST 63* 45*  ALT 26 23  ALKPHOS 52 39  BILITOT 1.7* 0.8  PROT 7.6 5.9*  ALBUMIN 4.2 3.2*  CBG: Recent Labs  Lab 08/04/23 0838 08/04/23 1154 08/04/23 1643 08/04/23 2108 08/05/23 0835  GLUCAP 147* 196* 180* 246* 203*    Scheduled Meds:  enoxaparin  (LOVENOX ) injection  40 mg Subcutaneous Q24H   insulin  aspart  0-9 Units Subcutaneous TID WC   insulin  glargine-yfgn  18 Units Subcutaneous QHS   magnesium oxide  400 mg Oral Daily   pantoprazole   40 mg Oral Daily   polyethylene glycol  17 g Oral Daily   senna  1 tablet Oral QHS   Continuous Infusions: PRN Meds:.acetaminophen  **OR** acetaminophen , magnesium hydroxide, ondansetron  **OR** ondansetron  (ZOFRAN ) IV, traZODone  Family Communication: None at  bedside  Disposition: Status is: Inpatient Remains inpatient appropriate because: Rhabdomyolysis, safe disposition     Time spent: 31 minutes  Author: Jodeane Mulligan, DO 08/05/2023 10:57 AM  For on call review [email or web address].

## 2023-08-05 NOTE — Care Management Important Message (Signed)
 Important Message  Patient Details  Name: Emma Guzman MRN: [id number] Date of Birth: 03-29-1946   Important Message Given:  Yes - Medicare IM     Anise Kerns 08/05/2023, 2:56 PM

## 2023-08-05 NOTE — Progress Notes (Signed)
 Physical Therapy Treatment Patient Details Name: Emma Guzman MRN: [id number] DOB: September 04, 1945 Today's Date: 08/05/2023   History of Present Illness Pt is a 78 y.o. female presented to ED with acute onset of AMS with confusion, recurrent falls without injuries, urinary frequency and urgency with dysuria, aching all over. PMH of CHF, CKD, type 2 diabetes mellitus, GERD, hypertension, dyslipidemia, OSA and osteoarthritis.    PT Comments  Patient was agreeable to PT with encouragement. She continues to need assistance for bed mobility. Sitting balance is fair with no dizziness reported. She declined attempting to stand at this time. Generalized weakness throughout. Recommend to continue PT to maximize independence and to decrease caregiver burden. Discharge recommendations remain appropriate at this time.    If plan is discharge home, recommend the following: Two people to help with walking and/or transfers;A lot of help with bathing/dressing/bathroom;Assistance with cooking/housework;Direct supervision/assist for medications management;Assist for transportation;Help with stairs or ramp for entrance   Can travel by private vehicle     No  Equipment Recommendations  Wheelchair (measurements PT)    Recommendations for Other Services       Precautions / Restrictions Precautions Precautions: Fall Recall of Precautions/Restrictions: Intact Restrictions Weight Bearing Restrictions Per Provider Order: No     Mobility  Bed Mobility Overal bed mobility: Needs Assistance Bed Mobility: Supine to Sit, Sit to Supine     Supine to sit: Max assist, HOB elevated Sit to supine: Mod assist, HOB elevated   General bed mobility comments: verbal cues for sequencing and task initiation. increased time and effort required    Transfers                   General transfer comment: patient declined to attempt standing    Ambulation/Gait                   Stairs              Wheelchair Mobility     Tilt Bed    Modified Rankin (Stroke Patients Only)       Balance Overall balance assessment: Needs assistance Sitting-balance support: Feet unsupported Sitting balance-Leahy Scale: Fair                                      Hotel manager: Impaired Factors Affecting Communication: Hearing impaired  Cognition Arousal: Alert Behavior During Therapy: WFL for tasks assessed/performed   PT - Cognitive impairments: No apparent impairments                         Following commands: Impaired Following commands impaired: Follows one step commands with increased time    Cueing Cueing Techniques: Verbal cues  Exercises      General Comments General comments (skin integrity, edema, etc.): no dizziness reported with upright activity. patient has generalized weakness throughout      Pertinent Vitals/Pain Pain Assessment Pain Assessment: Faces Faces Pain Scale: Hurts a little bit Pain Location: right knee, left hip Pain Descriptors / Indicators: Discomfort Pain Intervention(s): Limited activity within patient's tolerance, Monitored during session, Repositioned    Home Living                          Prior Function            PT Goals (current goals can now be found  in the care plan section) Acute Rehab PT Goals Patient Stated Goal: To be with my husband PT Goal Formulation: With patient Time For Goal Achievement: 08/16/23 Potential to Achieve Goals: Fair Progress towards PT goals: Progressing toward goals    Frequency    Min 2X/week      PT Plan      Co-evaluation              AM-PAC PT "6 Clicks" Mobility   Outcome Measure  Help needed turning from your back to your side while in a flat bed without using bedrails?: A Lot Help needed moving from lying on your back to sitting on the side of a flat bed without using bedrails?: Total Help needed moving to and  from a bed to a chair (including a wheelchair)?: Total Help needed standing up from a chair using your arms (e.g., wheelchair or bedside chair)?: A Lot Help needed to walk in hospital room?: Total Help needed climbing 3-5 steps with a railing? : Total 6 Click Score: 8    End of Session   Activity Tolerance: Patient tolerated treatment well Patient left: in bed;with call bell/phone within reach;with bed alarm set   PT Visit Diagnosis: Unsteadiness on feet (R26.81);History of falling (Z91.81);Difficulty in walking, not elsewhere classified (R26.2);Muscle weakness (generalized) (M62.81);Pain     Time: 1610-9604 PT Time Calculation (min) (ACUTE ONLY): 27 min  Charges:    $Therapeutic Activity: 23-37 mins PT General Charges $$ ACUTE PT VISIT: 1 Visit                     Ozie Bo, PT, MPT    Erlene Hawks 08/05/2023, 11:07 AM

## 2023-08-06 DIAGNOSIS — E119 Type 2 diabetes mellitus without complications: Secondary | ICD-10-CM | POA: Diagnosis not present

## 2023-08-06 DIAGNOSIS — M6282 Rhabdomyolysis: Secondary | ICD-10-CM | POA: Diagnosis not present

## 2023-08-06 DIAGNOSIS — Z794 Long term (current) use of insulin: Secondary | ICD-10-CM | POA: Diagnosis not present

## 2023-08-06 LAB — GLUCOSE, CAPILLARY
Glucose-Capillary: 206 mg/dL — ABNORMAL HIGH (ref 70–99)
Glucose-Capillary: 192 mg/dL — ABNORMAL HIGH (ref 70–99)
Glucose-Capillary: 275 mg/dL — ABNORMAL HIGH (ref 70–99)
Glucose-Capillary: 263 mg/dL — ABNORMAL HIGH (ref 70–99)

## 2023-08-06 LAB — BASIC METABOLIC PANEL WITH GFR
Anion gap: 8 (ref 5–15)
BUN: 19 mg/dL (ref 8–23)
CO2: 28 mmol/L (ref 22–32)
Calcium: 9.7 mg/dL (ref 8.9–10.3)
Chloride: 97 mmol/L — ABNORMAL LOW (ref 98–111)
Creatinine, Ser: 0.53 mg/dL (ref 0.44–1.00)
GFR, Estimated: >60 mL/min (ref >60)
Glucose, Bld: 236 mg/dL — ABNORMAL HIGH (ref 70–99)
Potassium: 4.1 mmol/L (ref 3.5–5.1)
Sodium: 133 mmol/L — ABNORMAL LOW (ref 135–145)

## 2023-08-06 LAB — CK: Total CK: 181 U/L (ref 38–234)

## 2023-08-06 LAB — MAGNESIUM: Magnesium: 1.6 mg/dL — ABNORMAL LOW (ref 1.7–2.4)

## 2023-08-06 MED ORDER — MAGNESIUM SULFATE 2 GM/50ML IV SOLN
2.0000 g | Freq: Once | INTRAVENOUS | Status: AC
  Administered 2023-08-06: 2 g via INTRAVENOUS
  Filled 2023-08-06: qty 50

## 2023-08-06 MED ORDER — INSULIN ASPART 100 UNIT/ML IJ SOLN
3.0000 [IU] | Freq: Three times a day (TID) | INTRAMUSCULAR | Status: DC
  Administered 2023-08-06 – 2023-08-07 (×4): 3 [IU] via SUBCUTANEOUS
  Filled 2023-08-06 (×4): qty 1

## 2023-08-06 MED ORDER — INSULIN GLARGINE-YFGN 100 UNIT/ML ~~LOC~~ SOLN
20.0000 [IU] | Freq: Every day | SUBCUTANEOUS | Status: DC
  Administered 2023-08-06: 20 [IU] via SUBCUTANEOUS
  Filled 2023-08-06 (×2): qty 0.2

## 2023-08-06 NOTE — NC FL2 (Signed)
 [place]  MEDICAID FL2 LEVEL OF CARE FORM     IDENTIFICATION  Patient Name: Emma Guzman Birthdate: 30-Aug-1945 Sex: female Admission Date (Current Location): 08/02/2023  [facility] and [place] Number:  Chiropodist and Address:  [facility], [address] [place], [place] [address]      Provider Number: [id number]  Attending Physician Name and Address:  Jodeane Mulligan, DO  Relative Name and Phone Number:  Jerilynn Ryberg, son, [phone]    Current Level of Care: Hospital Recommended Level of Care: Skilled Nursing Facility Prior Approval Number:    Date Approved/Denied: 10/25/22 PASRR Number: [id number]  Discharge Plan: SNF    Current Diagnoses: Patient Active Problem List   Diagnosis Date Noted   Hypertensive urgency 08/03/2023   Type 2 diabetes mellitus without complications (HCC) 08/03/2023   GERD without esophagitis 08/03/2023   Depression 08/03/2023   Rhabdomyolysis 08/02/2023   DKA (diabetic ketoacidosis) (HCC) 10/24/2022   Diabetes mellitus without complication (HCC) 10/24/2022   Elevated CK 10/24/2022   UTI (urinary tract infection) 10/24/2022   Elevated lactic acid level 10/24/2022   Fall at home, initial encounter 10/24/2022   Chronic systolic CHF (congestive heart failure) (HCC) 10/24/2022   Hypertension    Hypercholesteremia    Apnea, sleep 04/07/2014   Cardiac defibrillator in place 04/07/2014   Cardiomyopathy (HCC) 04/07/2014   Acid reflux 04/07/2014   Morbid obesity (HCC) 05/12/2008   CARDIOMYOPATHY, PRIMARY, DILATED 05/12/2008   LBBB 05/12/2008   SYSTOLIC HEART FAILURE, ACUTE ON CHRONIC 05/12/2008   Implantable cardioverter-defibrillator (ICD) in situ 05/12/2008   Automatic implantable cardioverter-defibrillator in situ 05/12/2008   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   Block, bundle branch, left 05/12/2008    Orientation RESPIRATION BLADDER Height & Weight     Self, Time, Place, Situation   Normal External catheter Weight: 59 kg Height:  5\' 1"  (154.9 cm)  BEHAVIORAL SYMPTOMS/MOOD NEUROLOGICAL BOWEL NUTRITION STATUS      Incontinent Diet (Please see discharge summary)  AMBULATORY STATUS COMMUNICATION OF NEEDS Skin   Extensive Assist   Skin abrasions, Bruising                       Personal Care Assistance Level of Assistance  Bathing, Feeding, Dressing, Total care Bathing Assistance: Limited assistance Feeding assistance: Limited assistance Dressing Assistance: Limited assistance Total Care Assistance: Limited assistance   Functional Limitations Info  Hearing, Sight Sight Info: Impaired Hearing Info: Impaired      SPECIAL CARE FACTORS FREQUENCY  PT (By licensed PT), OT (By licensed OT)     PT Frequency: 5 x per week OT Frequency: 5 x per week            Contractures      Additional Factors Info  Code Status, Allergies Code Status Info: DNR Allergies Info: Metformin    Received from outside source  High - Diarrhea  Pravastatin    Received from outside source  High - Muscle Pain           Current Medications (08/06/2023):  This is the current hospital active medication list Current Facility-Administered Medications  Medication Dose Route Frequency Provider Last Rate Last Admin   acetaminophen  (TYLENOL ) tablet 650 mg  650 mg Oral Q6H PRN Mansy, Jan A, MD   650 mg at 08/06/23 8469   Or   acetaminophen  (TYLENOL ) suppository 650 mg  650 mg Rectal Q6H PRN Mansy, Anastasio Kaska, MD       enoxaparin  (LOVENOX ) injection  40 mg  40 mg Subcutaneous Q24H Mansy, Jan A, MD   40 mg at 08/06/23 0809   insulin  aspart (novoLOG ) injection 0-9 Units  0-9 Units Subcutaneous TID WC Jodeane Mulligan, DO   2 Units at 08/06/23 1143   insulin  aspart (novoLOG ) injection 3 Units  3 Units Subcutaneous TID WC Jodeane Mulligan, DO   3 Units at 08/06/23 1144   insulin  glargine-yfgn (SEMGLEE ) injection 20 Units  20 Units Subcutaneous QHS Pandora Bogaert W, DO       magnesium hydroxide (MILK  OF MAGNESIA) suspension 30 mL  30 mL Oral Daily PRN Mansy, Jan A, MD       ondansetron  (ZOFRAN ) tablet 4 mg  4 mg Oral Q6H PRN Mansy, Jan A, MD       Or   ondansetron  (ZOFRAN ) injection 4 mg  4 mg Intravenous Q6H PRN Mansy, Jan A, MD       pantoprazole  (PROTONIX ) EC tablet 40 mg  40 mg Oral Daily Mansy, Jan A, MD   40 mg at 08/06/23 0809   polyethylene glycol (MIRALAX  / GLYCOLAX ) packet 17 g  17 g Oral Daily Mansy, Jan A, MD   17 g at 08/06/23 0809   senna (SENOKOT) tablet 8.6 mg  1 tablet Oral QHS Ramonita Burow, RPH   8.6 mg at 08/05/23 2132   traZODone (DESYREL) tablet 25 mg  25 mg Oral QHS PRN Mansy, Anastasio Kaska, MD         Discharge Medications: Please see discharge summary for a list of discharge medications.  Relevant Imaging Results:  Relevant Lab Results:   Additional Information SSN: [id number]  Crayton Docker, RN

## 2023-08-06 NOTE — Inpatient Diabetes Management (Signed)
 Inpatient Diabetes Program Recommendations  AACE/ADA: New Consensus Statement on Inpatient Glycemic Control  Target Ranges:  Prepandial:   less than 140 mg/dL      Peak postprandial:   less than 180 mg/dL (1-2 hours)      Critically ill patients:  140 - 180 mg/dL    Latest Reference Range & Units 08/05/23 08:35 08/05/23 11:47 08/05/23 17:30 08/05/23 21:11 08/06/23 08:05  Glucose-Capillary 70 - 99 mg/dL 540 (H) 981 (H) 191 (H) 243 (H) 206 (H)   Review of Glycemic Control  Diabetes history: DM2 Outpatient Diabetes medications: Semglee  18 units at bedtime, Glipizide 10 mg BID, Novolog  3 units TID with meals (not taking), Novolog  0-15 units TID (not taking) Current orders for Inpatient glycemic control: Semglee  18 units QHS, Novolog  0-9 units TID with meals  Inpatient Diabetes Program Recommendations:    Insulin : Please consider increasing Semglee  to 20 units at bedtime and ordering Novolog  3 units TID with meals for meal coverage if patient eats at least 50% of meals.  Thanks, Beacher Limerick, RN, MSN, CDCES Diabetes Coordinator Inpatient Diabetes Program [phone] (Team Pager from 8am to 5pm)

## 2023-08-06 NOTE — Plan of Care (Signed)
  Problem: Clinical Measurements: Goal: Ability to maintain clinical measurements within normal limits will improve Outcome: Progressing   Problem: Activity: Goal: Risk for activity intolerance will decrease Outcome: Not Progressing   Problem: Elimination: Goal: Will not experience complications related to bowel motility Outcome: Progressing

## 2023-08-06 NOTE — Progress Notes (Signed)
 Physical Therapy Treatment Patient Details Name: Emma Guzman MRN: [id number] DOB: 1945-05-10 Today's Date: 08/06/2023   History of Present Illness Pt is a 78 y.o. female presented to ED with acute onset of AMS with confusion, recurrent falls without injuries, urinary frequency and urgency with dysuria, aching all over. PMH of CHF, CKD, type 2 diabetes mellitus, GERD, hypertension, dyslipidemia, OSA and osteoarthritis.    PT Comments  Patient is agreeable to PT session with encouragement. She continues to require assistance with bed mobility. Sitting balance is fair. Patient did participate with lateral scoot transfers with assistance. Activity tolerance limited by fatigue. Recommend to continue PT to maximize independence. Rehabilitation < 3 hours/day recommended after this hospital stay pending patient goals.    If plan is discharge home, recommend the following: Two people to help with walking and/or transfers;A lot of help with bathing/dressing/bathroom;Assistance with cooking/housework;Direct supervision/assist for medications management;Assist for transportation;Help with stairs or ramp for entrance   Can travel by private vehicle     No  Equipment Recommendations  Wheelchair (measurements PT)    Recommendations for Other Services       Precautions / Restrictions Precautions Precautions: Fall Restrictions Weight Bearing Restrictions Per Provider Order: No     Mobility  Bed Mobility Overal bed mobility: Needs Assistance Bed Mobility: Supine to Sit, Sit to Supine     Supine to sit: Max assist Sit to supine: Mod assist   General bed mobility comments: patient puts forth good effort with bed mobility today but continues to need maximal assistance to sit up, mostly for trunk support and to scoot hips anteriorly. verbal cues for initiation and technique    Transfers Overall transfer level: Needs assistance   Transfers: Bed to chair/wheelchair/BSC             Lateral/Scoot Transfers: Max assist General transfer comment: 2 lateral scoots to the right performed with cues for technique. patient declined standing due to fatigue and requesting to return to bed after sitting up only briefly    Ambulation/Gait               General Gait Details: unable to at this time   Stairs             Wheelchair Mobility     Tilt Bed    Modified Rankin (Stroke Patients Only)       Balance Overall balance assessment: Needs assistance Sitting-balance support: Feet unsupported Sitting balance-Leahy Scale: Fair                                      Hotel manager: Impaired Factors Affecting Communication: Hearing impaired  Cognition Arousal: Alert Behavior During Therapy: WFL for tasks assessed/performed   PT - Cognitive impairments: No apparent impairments                       PT - Cognition Comments: needs encourgaement and reassurance throughout. Following commands: Impaired Following commands impaired: Follows one step commands with increased time    Cueing Cueing Techniques: Verbal cues  Exercises      General Comments        Pertinent Vitals/Pain Pain Assessment Pain Assessment: Faces Faces Pain Scale: Hurts a little bit Pain Location: right knee, left hip Pain Descriptors / Indicators: Discomfort Pain Intervention(s): Limited activity within patient's tolerance, Monitored during session, Repositioned    Home Living  Prior Function            PT Goals (current goals can now be found in the care plan section) Acute Rehab PT Goals Patient Stated Goal: to be left alone PT Goal Formulation: With patient Time For Goal Achievement: 08/16/23 Potential to Achieve Goals: Fair Progress towards PT goals: Progressing toward goals    Frequency    Min 2X/week      PT Plan      Co-evaluation              AM-PAC PT "6  Clicks" Mobility   Outcome Measure  Help needed turning from your back to your side while in a flat bed without using bedrails?: A Lot Help needed moving from lying on your back to sitting on the side of a flat bed without using bedrails?: Total Help needed moving to and from a bed to a chair (including a wheelchair)?: Total Help needed standing up from a chair using your arms (e.g., wheelchair or bedside chair)?: A Lot Help needed to walk in hospital room?: Total Help needed climbing 3-5 steps with a railing? : Total 6 Click Score: 8    End of Session   Activity Tolerance: Patient limited by fatigue Patient left: in bed;with bed alarm set;with call bell/phone within reach Nurse Communication: Mobility status PT Visit Diagnosis: Unsteadiness on feet (R26.81);History of falling (Z91.81);Difficulty in walking, not elsewhere classified (R26.2);Muscle weakness (generalized) (M62.81);Pain     Time: 1610-9604 PT Time Calculation (min) (ACUTE ONLY): 14 min  Charges:    $Therapeutic Activity: 8-22 mins PT General Charges $$ ACUTE PT VISIT: 1 Visit                     Ozie Bo, PT, MPT    Erlene Hawks 08/06/2023, 11:27 AM

## 2023-08-06 NOTE — Progress Notes (Signed)
 Occupational Therapy Treatment Patient Details Name: Emma Guzman MRN: [id number] DOB: December 29, 1945 Today's Date: 08/06/2023   History of present illness Pt is a 78 y.o. female presented to ED with acute onset of AMS with confusion, recurrent falls without injuries, urinary frequency and urgency with dysuria, aching all over. PMH of CHF, CKD, type 2 diabetes mellitus, GERD, hypertension, dyslipidemia, OSA and osteoarthritis.   OT comments  Pt seen for OT tx this date. Pt endorsed feeling tired and having significant BLE pain. RN notified. Despite encouragement, pt declined EOB/OOB ADL/mobility. Agreeable to bed level grooming tasks, requiring MIN A for thoroughness. Pt educated in benefits of ADL participation and ADL mobility participation to support safety and independence. Active listening and emotional support provided to pt as she recounts her desire to see her spouse again (passed) and shares about her sons. OT facilitated problem solving to identify barriers and strengths to support pt's recovery. Pt continues to benefit from skilled OT services, will continue to progress as able.       If plan is discharge home, recommend the following:  A lot of help with walking and/or transfers;A lot of help with bathing/dressing/bathroom;Help with stairs or ramp for entrance   Equipment Recommendations  Other (comment) (defer)    Recommendations for Other Services      Precautions / Restrictions Precautions Precautions: Fall Recall of Precautions/Restrictions: Intact Restrictions Weight Bearing Restrictions Per Provider Order: No       Mobility Bed Mobility               General bed mobility comments: Pt declined despite max encouragement, active listening, and emotional support    Transfers                   General transfer comment: Pt declined despite max encouragement, active listening, and emotional support     Balance                                            ADL either performed or assessed with clinical judgement   ADL Overall ADL's : Needs assistance/impaired     Grooming: Bed level;Brushing hair;Minimal assistance Grooming Details (indicate cue type and reason): Min A for thoroughness in combing the back of her head                                    Extremity/Trunk Assessment              Vision       Perception     Praxis     Communication     Cognition Arousal: Alert Behavior During Therapy: WFL for tasks assessed/performed Cognition: No family/caregiver present to determine baseline             OT - Cognition Comments: Pt endorses feeling "ready to go be with my (late) husband and Jesus", appears to have depressed mood, reshares stories                 Following commands: Impaired Following commands impaired: Follows one step commands with increased time      Cueing   Cueing Techniques: Verbal cues  Exercises Other Exercises Other Exercises: Pt educated in benefits of ADL participation and ADL mobility participation to support safety and independence.    Shoulder Instructions  General Comments      Pertinent Vitals/ Pain       Pain Assessment Pain Assessment: 0-10 Pain Score: 10-Worst pain ever Pain Location: BLE, R>L Pain Descriptors / Indicators: Aching Pain Intervention(s): Limited activity within patient's tolerance, Monitored during session, Repositioned, Premedicated before session, Patient requesting pain meds-RN notified  Home Living                                          Prior Functioning/Environment              Frequency  Min 2X/week        Progress Toward Goals  OT Goals(current goals can now be found in the care plan section)  Progress towards OT goals: OT to reassess next treatment  Acute Rehab OT Goals Patient Stated Goal: improve pain/function OT Goal Formulation: With patient Time For Goal  Achievement: 08/17/23 Potential to Achieve Goals: Fair  Plan      Co-evaluation                 AM-PAC OT "6 Clicks" Daily Activity     Outcome Measure   Help from another person eating meals?: None Help from another person taking care of personal grooming?: A Little Help from another person toileting, which includes using toliet, bedpan, or urinal?: A Lot Help from another person bathing (including washing, rinsing, drying)?: A Lot Help from another person to put on and taking off regular upper body clothing?: A Little Help from another person to put on and taking off regular lower body clothing?: A Lot 6 Click Score: 16    End of Session    OT Visit Diagnosis: Other abnormalities of gait and mobility (R26.89);Muscle weakness (generalized) (M62.81);Repeated falls (R29.6)   Activity Tolerance Patient limited by pain   Patient Left in bed;with call bell/phone within reach;with bed alarm set;Other (comment) (TOC)   Nurse Communication Mobility status;Patient requests pain meds;Other (comment) (depressed mood)        Time: 2130-8657 OT Time Calculation (min): 36 min  Charges: OT General Charges $OT Visit: 1 Visit OT Treatments $Self Care/Home Management : 8-22 mins $Therapeutic Activity: 8-22 mins  Berenda Breaker., MPH, MS, OTR/L ascom [phone] 08/06/23, 5:04 PM

## 2023-08-06 NOTE — TOC Progression Note (Signed)
 Transition of Care [facility]) - Progression Note    Patient Details  Name: Emma Guzman MRN: [id number] Date of Birth: Sep 28, 1945  [facility]) CM/SW Contact  Crayton Docker, RN Phone Number: 08/06/2023, 3:49 PM  Clinical Narrative:     Noted, SNF recommendations, CM call to patient's son, [name], phone: [phone] regarding SNF recommendations. No answer, CM left message for return call. FL2 and 30 day PASSR note completed,awaiting MD signature. CM to patient's room regarding SNF recommendations. Per patient, requests CM call patient's son, [name]. CM will await patient's son, call. CM faxed SNF referrals to [facility] [place], [facility], [facility].  Expected Discharge Plan: Skilled Nursing Facility Barriers to Discharge: Continued Medical Work up  Expected Discharge Plan and Services       Living arrangements for the past 2 months: Apartment                   DME Agency: NA       HH Arranged: NA           Social Determinants of Health (SDOH) Interventions SDOH Screenings   Food Insecurity: Patient Unable To Answer (08/03/2023)  Housing: Patient Unable To Answer (08/03/2023)  Transportation Needs: Patient Unable To Answer (08/03/2023)  Utilities: Patient Unable To Answer (08/03/2023)  Financial Resource Strain: Low Risk  (09/15/2021)   Received from [facility]  Physical Activity: Inactive (09/15/2021)   [facility]  Social Connections: Patient Unable To Answer (08/03/2023)  Stress: Stress Concern Present (09/15/2021)   Received from [facility]  Tobacco Use: Low Risk  (08/02/2023)    Readmission Risk Interventions     No data to display

## 2023-08-06 NOTE — TOC Progression Note (Signed)
 Transition of Care [facility]) - Progression Note    Patient Details  Name: Emma Guzman MRN: [id number] Date of Birth: October 30, 1945  Transition of Care [facility]) CM/SW Contact  Crayton Docker, RN 08/06/2023, 3:45 PM  Clinical Narrative:      The above named patient is recommended to go to Short Term Rehab for strengthening and gait training for balance.  It is expected that the Short Term Rehab stay will be less than 30 days.  The patient is expected to return home after Rehab.   Expected Discharge Plan: Skilled Nursing Facility Barriers to Discharge: Continued Medical Work up  Expected Discharge Plan and Services    SNF   Living arrangements for the past 2 months: Apartment                   DME Agency: NA    HH Arranged: NA    Social Determinants of Health (SDOH) Interventions SDOH Screenings   Food Insecurity: Patient Unable To Answer (08/03/2023)  Housing: Patient Unable To Answer (08/03/2023)  Transportation Needs: Patient Unable To Answer (08/03/2023)  Utilities: Patient Unable To Answer (08/03/2023)  Financial Resource Strain: Low Risk  (09/15/2021)   Received from [facility]  Physical Activity: Inactive (09/15/2021)   Received from [facility]  Social Connections: Patient Unable To Answer (08/03/2023)  Stress: Stress Concern Present (09/15/2021)   Received from [facility]  Tobacco Use: Low Risk  (08/02/2023)    Readmission Risk Interventions     No data to display

## 2023-08-06 NOTE — Progress Notes (Addendum)
 Progress Note   Patient: Emma Guzman NWG:[id number] DOB: 11/24/1945 DOA: 08/02/2023     4 DOS: the patient was seen and examined on 08/06/2023   Brief hospital course:  78 y.o. Caucasian female with medical history significant for CHF, CKD, type 2 diabetes mellitus, GERD, hypertension, dyslipidemia, OSA and osteoarthritis, presented to the emergency room with acute onset of altered mental status with confusion.   Assessment and plan:   Acute metabolic encephalopathy - Likely multifactorial given patient's dehydration, rhabdomyolysis.  CT head negative.  Showing marked improvement.  Appears to be resolved at this time.   Rhabdomyolysis - Possibly secondary to recurrent falls and patient.  CK showing continued improvement, 180 down from 3382 on presentation.  Can DC IV fluid hydration.  Monitor urine output and recheck kidney function and magnesium in AM.   Hypomagnesemia/hypophosphatemia - Magnesium low again this morning.  Ordered 2 g IV.  Will recheck BMP, mag, Phos in AM.   Hypertensive urgency - Appears to be resolved.  Will monitor blood pressure closely.  Working with pharmacy on medical reconciliation.   Chronic HFpEF - Does not appear to be in acute exacerbation.  Monitor respiratory function given aggressive IV fluid hydration.   CKD 3 - Creatinine appears at baseline.   Depression - Continue Celexa   GERD - PPI on board.   Diabetes mellitus - Not well-controlled.  Will increase glargine from 18 units to 20 units at bedtime.  Add 3 units NovoLog  3 times daily with meals.  Insulin  sliding scale on board.   Physical debilitation muscle weakness - PT/OT on board.  Recommending STR.  Will work with patient, son, TOC on disposition planning.   Subjective: Patient resting this morning.  Continues to intermittently complain of diffuse bodyaches but otherwise feels well.  In good spirits.  Denies any fever, shortness of breath, chest pain, nausea, vomiting, abdominal  pain.  Physical Exam: Vitals:   08/05/23 1738 08/05/23 2039 08/06/23 0442 08/06/23 0803  BP: 123/77 118/78 117/86 137/81  Pulse: 86 91 86 89  Resp: 17 16 16 17   Temp: 98.5 F (36.9 C) 98 F (36.7 C) 98 F (36.7 C) 98.5 F (36.9 C)  TempSrc:      SpO2: 98% 98% 98% 97%  Weight:      Height:        GENERAL:  Alert, pleasant, no acute distress, frail/cachectic HEENT:  EOMI CARDIOVASCULAR:  RRR, no murmurs appreciated RESPIRATORY:  Clear to auscultation, no wheezing, rales, or rhonchi GASTROINTESTINAL:  Soft, nontender, nondistended EXTREMITIES:  No LE edema bilaterally NEURO:  No new focal deficits appreciated SKIN:  No rashes noted PSYCH:  Appropriate mood and affect    Data Reviewed:  None  Labs: CBC: Recent Labs  Lab 08/02/23 1845 08/03/23 0405 08/04/23 0351 08/05/23 0317  WBC 10.3 10.3 7.8 6.6  HGB 15.5* 14.2 11.9* 11.9*  HCT 45.4 42.2 35.0* 35.1*  MCV 89.4 89.8 90.0 90.7  PLT 184 182 149* 138*   Basic Metabolic Panel: Recent Labs  Lab 08/02/23 1845 08/03/23 0405 08/04/23 0351 08/05/23 0317 08/06/23 0452  NA 134* 136 138 136 133*  K 3.5 3.8 3.7 4.1 4.1  CL 96* 100 107 102 97*  CO2 20* 24 25 26 28   GLUCOSE 332* 229* 176* 248* 236*  BUN 25* 27* 20 18 19   CREATININE 0.69 0.60 0.52 0.51 0.53  CALCIUM 10.1 9.9 9.5 9.4 9.7  MG  --   --  1.5* 1.5* 1.6*  PHOS  --   --  1.8* 1.9*  --    Liver Function Tests: Recent Labs  Lab 08/02/23 1845 08/04/23 0351  AST 63* 45*  ALT 26 23  ALKPHOS 52 39  BILITOT 1.7* 0.8  PROT 7.6 5.9*  ALBUMIN 4.2 3.2*   CBG: Recent Labs  Lab 08/05/23 0835 08/05/23 1147 08/05/23 1730 08/05/23 2111 08/06/23 0805  GLUCAP 203* 332* 243* 243* 206*    Scheduled Meds:  enoxaparin  (LOVENOX ) injection  40 mg Subcutaneous Q24H   insulin  aspart  0-9 Units Subcutaneous TID WC   insulin  glargine-yfgn  18 Units Subcutaneous QHS   pantoprazole   40 mg Oral Daily   polyethylene glycol  17 g Oral Daily   senna  1 tablet Oral  QHS   Continuous Infusions: PRN Meds:.acetaminophen  **OR** acetaminophen , magnesium hydroxide, ondansetron  **OR** ondansetron  (ZOFRAN ) IV, traZODone  Family Communication: None at bedside  Disposition: Status is: Inpatient Remains inpatient appropriate because: Rhabdomyolysis     Time spent: 36 minutes  Author: Jodeane Mulligan, DO 08/06/2023 10:44 AM  For on call review [email or web address].

## 2023-08-07 DIAGNOSIS — T796XXA Traumatic ischemia of muscle, initial encounter: Secondary | ICD-10-CM | POA: Diagnosis not present

## 2023-08-07 LAB — CK: Total CK: 96 U/L (ref 38–234)

## 2023-08-07 LAB — BASIC METABOLIC PANEL WITH GFR
Anion gap: 8 (ref 5–15)
BUN: 23 mg/dL (ref 8–23)
CO2: 28 mmol/L (ref 22–32)
Calcium: 9.8 mg/dL (ref 8.9–10.3)
Chloride: 99 mmol/L (ref 98–111)
Creatinine, Ser: 0.53 mg/dL (ref 0.44–1.00)
GFR, Estimated: >60 mL/min (ref >60)
Glucose, Bld: 194 mg/dL — ABNORMAL HIGH (ref 70–99)
Potassium: 4.6 mmol/L (ref 3.5–5.1)
Sodium: 135 mmol/L (ref 135–145)

## 2023-08-07 LAB — MAGNESIUM: Magnesium: 1.8 mg/dL (ref 1.7–2.4)

## 2023-08-07 LAB — GLUCOSE, CAPILLARY
Glucose-Capillary: 282 mg/dL — ABNORMAL HIGH (ref 70–99)
Glucose-Capillary: 280 mg/dL — ABNORMAL HIGH (ref 70–99)

## 2023-08-07 LAB — PHOSPHORUS: Phosphorus: 2.9 mg/dL (ref 2.5–4.6)

## 2023-08-07 MED ORDER — INSULIN GLARGINE-YFGN 100 UNIT/ML ~~LOC~~ SOLN: 20.0000 [IU] | Freq: Every day | SUBCUTANEOUS | 11 refills | Status: DC

## 2023-08-07 MED ORDER — TRAZODONE HCL 50 MG PO TABS: 25.0000 mg | ORAL_TABLET | Freq: Every evening | ORAL | 0 refills | Status: DC | PRN

## 2023-08-07 NOTE — TOC Progression Note (Addendum)
 Transition of Care [facility]) - Progression Note    Patient Details  Name: Emma Guzman MRN: [id number] Date of Birth: 1945/05/30  Transition of Care [facility]) CM/SW Contact  Crayton Docker, RN 08/07/2023, 9:42 AM  Clinical Narrative:     CM follow up call to patient's son, [name], phone; [phone] regarding accepted SNF offer from [facility] [place]. Per patient's son, amenable to [facility], patient has been to SNF previously. CM call to Admissions, [facility] [place], phone: [phone], no answer, CM left message for return call.   Call received from Gena, Admissions, [facility] [place], SNF is extending bed offer and patient can admit to Room 702, private room. Per Gena, CM can initiate auth.   CM alert to Nitchia P, CMA regarding initiating auth.  Alert received from [facility] CMA, Siegfried Dress is Approved PlanAuthID: [id number] Dates: 5/7-5/12-2023 Next Review Date: 08/09/2023  Expected Discharge Plan: Skilled Nursing Facility Barriers to Discharge: Continued Medical Work up  Expected Discharge Plan and Services    SNF   Living arrangements for the past 2 months: Apartment  DME Agency: NA    HH Arranged: NA  Social Determinants of Health (SDOH) Interventions SDOH Screenings   Food Insecurity: Patient Unable To Answer (08/03/2023)  Housing: Patient Unable To Answer (08/03/2023)  Transportation Needs: Patient Unable To Answer (08/03/2023)  Utilities: Patient Unable To Answer (08/03/2023)  Financial Resource Strain: Low Risk  (09/15/2021)   Received from [facility]  Physical Activity: Inactive (09/15/2021)   Received from [facility]  Social Connections: Patient Unable To Answer (08/03/2023)  Stress: Stress Concern Present (09/15/2021)   Received from [facility]  Tobacco Use: Low Risk  (08/02/2023)    Readmission Risk Interventions     No data to display

## 2023-08-07 NOTE — Plan of Care (Signed)

## 2023-08-07 NOTE — Progress Notes (Signed)
 Occupational Therapy Treatment Patient Details Name: Emma Guzman MRN: [id number] DOB: [birth year] Today's Date: 08/07/2023   History of present illness Pt is a 78 y.o. female presented to ED with acute onset of AMS with confusion, recurrent falls without injuries, urinary frequency and urgency with dysuria, aching all over. PMH of CHF, CKD, type 2 diabetes mellitus, GERD, hypertension, dyslipidemia, OSA and osteoarthritis.   OT comments  Pt seen today for OT tx, RN present to assist with transfers. Pt in recliner, requesting to return to bed. Pt pleasantly confused, repeats self frequently. Requires MAX A +2 to scoot upwards in recliner as pt slid forward. MOD A +2 to perform STS, and MOD A +2 to step pivot to bed using RW. Pt making progress towards goals, OT will continue to progress as able. Discharge recommendation appropriate.       If plan is discharge home, recommend the following:  A lot of help with walking and/or transfers;A lot of help with bathing/dressing/bathroom;Help with stairs or ramp for entrance   Equipment Recommendations  Other (comment)       Precautions / Restrictions Precautions Precautions: Fall Recall of Precautions/Restrictions: Impaired Restrictions Weight Bearing Restrictions Per Provider Order: No       Mobility Bed Mobility Overal bed mobility: Needs Assistance Bed Mobility: Sit to Supine     Supine to sit: Max assist     General bed mobility comments: assist for trunk and BLE    Transfers Overall transfer level: Needs assistance Equipment used: Rolling walker (2 wheels) Transfers: Bed to chair/wheelchair/BSC Sit to Stand: Mod assist           General transfer comment: pt slightly confused, hestitant to perform mobility and mildly resistive. MOD A +2 for step pivot transfer back to bed     Balance Overall balance assessment: Needs assistance Sitting-balance support: Feet unsupported Sitting balance-Leahy Scale: Fair Sitting  balance - Comments: recieved sideways in recliner with knees flexed, requesting to get back to bed                                   ADL either performed or assessed with clinical judgement   ADL Overall ADL's : Needs assistance/impaired                                       General ADL Comments: Session focused on functional transfers using RW. Max multimodal cuing, MOD A to stand, MOD A to step pivot recliner > bed     Communication Communication Communication: Impaired Factors Affecting Communication: Hearing impaired   Cognition Arousal: Alert Behavior During Therapy: Anxious Cognition: No family/caregiver present to determine baseline             OT - Cognition Comments: pleasantly confused, anxious, repeats self                 Following commands: Impaired Following commands impaired: Follows one step commands with increased time      Cueing   Cueing Techniques: Verbal cues        General Comments RN present to assist with transfer    Pertinent Vitals/ Pain       Pain Assessment Pain Assessment: No/denies pain Pain Score: 0-No pain  Frequency  Min 2X/week        Progress Toward Goals  OT Goals(current goals  can now be found in the care plan section)  Progress towards OT goals: Progressing toward goals  Acute Rehab OT Goals OT Goal Formulation: With patient Time For Goal Achievement: 08/17/23 Potential to Achieve Goals: Fair ADL Goals Pt Will Perform Lower Body Bathing: with min assist;sitting/lateral leans;sit to/from stand Pt Will Perform Lower Body Dressing: with min assist;sit to/from stand;sitting/lateral leans Pt Will Transfer to Toilet: with min assist;ambulating;regular height toilet;bedside commode Pt Will Perform Toileting - Clothing Manipulation and hygiene: with min assist;sitting/lateral leans;sit to/from stand  Plan         AM-PAC OT "6 Clicks" Daily Activity     Outcome Measure   Help from  another person eating meals?: None Help from another person taking care of personal grooming?: A Little Help from another person toileting, which includes using toliet, bedpan, or urinal?: A Lot Help from another person bathing (including washing, rinsing, drying)?: A Lot Help from another person to put on and taking off regular upper body clothing?: A Little Help from another person to put on and taking off regular lower body clothing?: A Lot 6 Click Score: 16    End of Session Equipment Utilized During Treatment: Rolling walker (2 wheels);Gait belt  OT Visit Diagnosis: Other abnormalities of gait and mobility (R26.89);Muscle weakness (generalized) (M62.81);Repeated falls (R29.6)   Activity Tolerance Patient tolerated treatment well   Patient Left in bed;with call bell/phone within reach;with bed alarm set   Nurse Communication Mobility status        Time: 4540-9811 OT Time Calculation (min): 15 min  Charges: OT General Charges $OT Visit: 1 Visit OT Treatments $Self Care/Home Management : 8-22 mins  Maridel Pixler L. Dessiree Sze, OTR/L  08/07/23, 4:53 PM

## 2023-08-07 NOTE — Discharge Summary (Signed)
 Physician Discharge Summary   Patient: Emma Guzman MRN: [id number] DOB: [birth year]  Admit date:     08/02/2023  Discharge date: 08/07/23  Discharge Physician: Antoino Westhoff   PCP: George, Sionne A, MD   Recommendations at discharge:   Take medications as recommended Check blood sugars daily Fall precautions  Discharge Diagnoses: Principal Problem:   Rhabdomyolysis Active Problems:   Hypertensive urgency   Type 2 diabetes mellitus without complications (HCC)   GERD without esophagitis   Depression  Resolved Problems:   * No resolved hospital problems. *  Hospital Course: Emma Guzman is a 78 y.o. Caucasian female with medical history significant for CHF, CKD, type 2 diabetes mellitus, GERD, hypertension, dyslipidemia, OSA and osteoarthritis, who presented to the emergency room with acute onset of altered mental status with confusion.  The patient has been having recurrent falls lately without injuries.  She admits to urinary frequency and urgency with dysuria for the hematuria or flank pain.  She has been aching all over.  No nausea or vomiting or abdominal pain.  No cough or wheezing or dyspnea.  No chest pain or palpitations.  No headache or dizziness or blurry vision.  No paresthesias or focal muscle weakness.   ED Course: When came to the ER, BP was 148/91 with heart rate of 111 later on BP was 159/130.  Vital signs were otherwise within normal.  Labs revealed borderline potassium of 3.5 and mild hypokalemia and hyponatremia, CO2 of 20 and blood glucose of 332, BUN of 25 and a gap of 18 and AST 63 with total bili 1.7.  CBC showed hemoconcentration and CK was 3382.  High sensitive troponin I was 31.  UA still pending EKG as reviewed by me : Who EKG showed atrial sensed ventricular paced rhythm with a rate of 109. Imaging: 2 view chest x-ray showed no acute cardiopulmonary disease.   The patient was given 1 L bolus of IV normal saline.  She will be admitted to a medical  telemetry bed for further evaluation and management.  Assessment and Plan:  Acute metabolic encephalopathy Likely multifactorial and secondary to patient's dehydration, rhabdomyolysis.   CT head was negative.  No evidence of pyuria Patient has improved and appears to be back to baseline at this time     Rhabdomyolysis Possibly secondary to recurrent falls and patient.   CK was elevated on admission and has normalized with IV fluid hydration  Encourage oral fluid intake     Hypomagnesemia/hypophosphatemia Supplemented    Hypertensive urgency Improved blood pressure Continue carvedilol  and lisinopril     Chronic HFpEF Does not appear to be in acute exacerbation.   Continue lisinopril  and carvedilol     CKD 3 - Creatinine appears at baseline.    Depression - Continue Celexa    GERD -Continue PPI on board.    Diabetes mellitus Continue glipizide and Lantus  Maintain consistent carbohydrate diet Check blood sugars daily    Physical debilitation Generalized weakness Appreciate PT/OT input Recommend short-term rehab on discharge To be discharged today to [facility]       Consultants: None Procedures performed: None Disposition: Nursing home Diet recommendation:  Discharge Diet Orders (From admission, onward)     Start     Ordered   08/07/23 0000  Diet - low sodium heart healthy        08/07/23 1317   08/07/23 0000  Diet Carb Modified        08/07/23 1317  Cardiac and Carb modified diet DISCHARGE MEDICATION: Allergies as of 08/07/2023   No Known Allergies      Medication List     STOP taking these medications    insulin  aspart 100 UNIT/ML injection Commonly known as: novoLOG        TAKE these medications    acetaminophen  500 MG tablet Commonly known as: TYLENOL  Take 500 mg by mouth every 6 (six) hours as needed.   carvedilol  25 MG tablet Commonly known as: COREG  Take 25 mg by mouth 2 (two) times daily with a meal. Dr  Parks Bollman   docusate sodium  100 MG capsule Commonly known as: COLACE Take 1 capsule (100 mg total) by mouth 2 (two) times daily.   glipiZIDE 10 MG 24 hr tablet Commonly known as: GLUCOTROL XL Take 10 mg by mouth 2 (two) times daily.   insulin  glargine-yfgn 100 UNIT/ML injection Commonly known as: SEMGLEE  Inject 0.2 mLs (20 Units total) into the skin at bedtime. What changed: how much to take   lisinopril  10 MG tablet Commonly known as: ZESTRIL  Take 1 tablet (10 mg total) by mouth daily. Dr Parks Bollman   omeprazole 20 MG capsule Commonly known as: PRILOSEC Take 20 mg by mouth at bedtime and may repeat dose one time if needed. Dr Paraschos   polyethylene glycol 17 g packet Commonly known as: MIRALAX  / GLYCOLAX  Take 17 g by mouth daily.   senna 8.6 MG Tabs tablet Commonly known as: SENOKOT Take 1 tablet (8.6 mg total) by mouth at bedtime.   traZODone 50 MG tablet Commonly known as: DESYREL Take 0.5 tablets (25 mg total) by mouth at bedtime as needed for sleep.        Contact information for follow-up providers     George, Sionne A, MD Follow up in 1 week(s).   Specialty: Family Medicine Contact information: [address] [place] [address] [phone]              Contact information for after-discharge care     Destination     [name] Preferred SNF .   Service: Skilled Nursing Contact information: [address] [place]  [address] [phone]                    Discharge Exam: [name] Weights   08/02/23 1843  Weight: 59 kg   GENERAL:  Alert, pleasant, no acute distress, frail/cachectic HEENT:  EOMI CARDIOVASCULAR:  RRR, no murmurs appreciated RESPIRATORY:  Clear to auscultation, no wheezing, rales, or rhonchi GASTROINTESTINAL:  Soft, nontender, nondistended EXTREMITIES:  No LE edema bilaterally NEURO:  No new focal deficits appreciated SKIN:  No rashes noted PSYCH:  Appropriate mood and  affect     Condition at discharge: stable  The results of significant diagnostics from this hospitalization (including imaging, microbiology, ancillary and laboratory) are listed below for reference.   Imaging Studies: DG FEMUR MIN 2 VIEWS LEFT Result Date: 08/03/2023 CLINICAL DATA:  Fall, left upper leg pain. EXAM: LEFT FEMUR 2 VIEWS COMPARISON:  10/24/2022. FINDINGS: There is no evidence of acute fracture or dislocation. Mild-to-moderate tricompartmental degenerative changes and chondrocalcinosis are noted at the knee. There is no joint effusion at the knee. Vascular calcifications are noted in the soft tissues. IMPRESSION: 1. No acute fracture or dislocation. 2. Mild-to-moderate degenerative changes at the left knee. Electronically Signed   By: Wyvonnia Heimlich M.D.   On: 08/03/2023 13:26   DG Chest 2 View Result Date: 08/02/2023 CLINICAL DATA:  Left  chest pain after several falls today. EXAM: CHEST - 2 VIEW COMPARISON:  01/17/2023 FINDINGS: Cardiac pacemaker. Shallow inspiration. Heart size and pulmonary vascularity are normal. Lungs are clear. No pleural effusion or pneumothorax. Mediastinal contours appear intact. Calcified aorta. Degenerative changes in the spine. IMPRESSION: No active cardiopulmonary disease. Electronically Signed   By: Boyce Byes M.D.   On: 08/02/2023 22:09   CT Cervical Spine Wo Contrast Result Date: 08/02/2023 CLINICAL DATA:  History of multiple falls today, initial encounter EXAM: CT CERVICAL SPINE WITHOUT CONTRAST TECHNIQUE: Multidetector CT imaging of the cervical spine was performed without intravenous contrast. Multiplanar CT image reconstructions were also generated. RADIATION DOSE REDUCTION: This exam was performed according to the departmental dose-optimization program which includes automated exposure control, adjustment of the mA and/or kV according to patient size and/or use of iterative reconstruction technique. COMPARISON:  None Available. FINDINGS: Alignment:  Within normal limits. Skull base and vertebrae: 7 cervical segments are well visualized. Vertebral body height is well maintained. No acute fracture or acute facet abnormality is noted. Facet hypertrophic changes are identified as arm. Mild osteophytic changes. Soft tissues and spinal canal: Surrounding soft tissue structures are within normal limits. Upper chest: Visualized lung apices are unremarkable. Other: None IMPRESSION: Degenerative change without acute abnormality. Electronically Signed   By: Violeta Grey M.D.   On: 08/02/2023 19:18   CT Head Wo Contrast Result Date: 08/02/2023 CLINICAL DATA:  Fall EXAM: CT HEAD WITHOUT CONTRAST TECHNIQUE: Contiguous axial images were obtained from the base of the skull through the vertex without intravenous contrast. RADIATION DOSE REDUCTION: This exam was performed according to the departmental dose-optimization program which includes automated exposure control, adjustment of the mA and/or kV according to patient size and/or use of iterative reconstruction technique. COMPARISON:  Head CT 01/17/2023 FINDINGS: Brain: No evidence of acute infarction, hemorrhage, hydrocephalus, extra-axial collection or mass lesion/mass effect. There is moderate patchy periventricular white matter hypodensity similar to the prior study. Vascular: Atherosclerotic calcifications are present within the cavernous internal carotid arteries. Skull: Normal. Negative for fracture or focal lesion. Sinuses/Orbits: No acute finding. Other: None. IMPRESSION: 1. No acute intracranial process. 2. Moderate chronic small vessel ischemic changes. Electronically Signed   By: Tyron Gallon M.D.   On: 08/02/2023 19:18    Microbiology: Results for orders placed or performed during the hospital encounter of 01/17/23  Resp Panel by RT-PCR (Flu A&B, Covid) Urine, In & Out Cath     Status: None   Collection Time: 01/17/23 11:17 PM   Specimen: Urine, In & Out Cath; Nasal Swab  Result Value Ref Range Status    SARS Coronavirus 2 by RT PCR NEGATIVE NEGATIVE Final    Comment: (NOTE) SARS-CoV-2 target nucleic acids are NOT DETECTED.  The SARS-CoV-2 RNA is generally detectable in upper respiratory specimens during the acute phase of infection. The lowest concentration of SARS-CoV-2 viral copies this assay can detect is 138 copies/mL. A negative result does not preclude SARS-Cov-2 infection and should not be used as the sole basis for treatment or other patient management decisions. A negative result may occur with  improper specimen collection/handling, submission of specimen other than nasopharyngeal swab, presence of viral mutation(s) within the areas targeted by this assay, and inadequate number of viral copies(<138 copies/mL). A negative result must be combined with clinical observations, patient history, and epidemiological information. The expected result is Negative.  Fact Sheet for Patients:  BloggerCourse.com  Fact Sheet for Healthcare Providers:  SeriousBroker.it  This test is no t yet approved  or cleared by the [place]  FDA and  has been authorized for detection and/or diagnosis of SARS-CoV-2 by FDA under an Emergency Use Authorization (EUA). This EUA will remain  in effect (meaning this test can be used) for the duration of the COVID-19 declaration under Section 564(b)(1) of the Act, 21 U.S.C.section 360bbb-3(b)(1), unless the authorization is terminated  or revoked sooner.       Influenza A by PCR NEGATIVE NEGATIVE Final   Influenza B by PCR NEGATIVE NEGATIVE Final    Comment: (NOTE) The Xpert Xpress SARS-CoV-2/FLU/RSV plus assay is intended as an aid in the diagnosis of influenza from Nasopharyngeal swab specimens and should not be used as a sole basis for treatment. Nasal washings and aspirates are unacceptable for Xpert Xpress SARS-CoV-2/FLU/RSV testing.  Fact Sheet for  Patients: BloggerCourse.com  Fact Sheet for Healthcare Providers: SeriousBroker.it  This test is not yet approved or cleared by the [place]  FDA and has been authorized for detection and/or diagnosis of SARS-CoV-2 by FDA under an Emergency Use Authorization (EUA). This EUA will remain in effect (meaning this test can be used) for the duration of the COVID-19 declaration under Section 564(b)(1) of the Act, 21 U.S.C. section 360bbb-3(b)(1), unless the authorization is terminated or revoked.  Performed at [facility], [address] [place], [place] [address]     Labs: CBC: Recent Labs  Lab 08/02/23 1845 08/03/23 0405 08/04/23 0351 08/05/23 0317  WBC 10.3 10.3 7.8 6.6  HGB 15.5* 14.2 11.9* 11.9*  HCT 45.4 42.2 35.0* 35.1*  MCV 89.4 89.8 90.0 90.7  PLT 184 182 149* 138*   Basic Metabolic Panel: Recent Labs  Lab 08/03/23 0405 08/04/23 0351 08/05/23 0317 08/06/23 0452 08/07/23 0256  NA 136 138 136 133* 135  K 3.8 3.7 4.1 4.1 4.6  CL 100 107 102 97* 99  CO2 24 25 26 28 28   GLUCOSE 229* 176* 248* 236* 194*  BUN 27* 20 18 19 23   CREATININE 0.60 0.52 0.51 0.53 0.53  CALCIUM 9.9 9.5 9.4 9.7 9.8  MG  --  1.5* 1.5* 1.6* 1.8  PHOS  --  1.8* 1.9*  --  2.9   Liver Function Tests: Recent Labs  Lab 08/02/23 1845 08/04/23 0351  AST 63* 45*  ALT 26 23  ALKPHOS 52 39  BILITOT 1.7* 0.8  PROT 7.6 5.9*  ALBUMIN 4.2 3.2*   CBG: Recent Labs  Lab 08/06/23 1130 08/06/23 1622 08/06/23 2120 08/07/23 1020 08/07/23 1207  GLUCAP 192* 275* 263* 282* 280*    Discharge time spent: greater than 30 minutes.  Signed: Read Camel, MD [facility] 08/07/2023

## 2023-08-07 NOTE — TOC Transition Note (Addendum)
 Transition of Care [facility]) - Discharge Note   Patient Details  Name: Emma Guzman MRN: [id number] Date of Birth: 11-May-1945  [facility]) CM/SW Contact:  Crayton Docker, RN 08/07/2023, 1:11 PM   Clinical Narrative:     Patient to discharge to [facility] today at 1500 via [facility]. CM follow up call placed to patient's son, [name] regarding pending discharge. Per [name] verbalized understanding and agreement with discharge plan. CM to patient's room regarding pending discharge. CM and patient called patient's son, [name] regarding pending discharge. Patient verbalized understanding and agreement to SNF after discussing plan with patient's son.   CM faxed discharge summary, discharge orders and SNF transfer report to [facility] [place].  Final next level of care: Skilled Nursing Facility Barriers to Discharge: No Barriers Identified   Patient Goals and CMS Choice    SNF, [facility]    Discharge Placement    SNF             Discharge Plan and Services Additional resources added to the After Visit Summary for    DME Agency: NA  HH Arranged: NA     Social Drivers of Health (SDOH) Interventions SDOH Screenings   Food Insecurity: Patient Unable To Answer (08/03/2023)  Housing: Patient Unable To Answer (08/03/2023)  Transportation Needs: Patient Unable To Answer (08/03/2023)  Utilities: Patient Unable To Answer (08/03/2023)  Financial Resource Strain: Low Risk  (09/15/2021)   Received from [facility]  Physical Activity: Inactive (09/15/2021)   Received from [facility]  Social Connections: Patient Unable To Answer (08/03/2023)  Stress: Stress Concern Present (09/15/2021)   Received from [facility]  Tobacco Use: Low Risk  (08/02/2023)     Readmission Risk Interventions     No data to display

## 2023-09-01 ENCOUNTER — Observation Stay

## 2023-09-01 ENCOUNTER — Other Ambulatory Visit: Payer: Self-pay

## 2023-09-01 ENCOUNTER — Emergency Department

## 2023-09-01 ENCOUNTER — Inpatient Hospital Stay
Admission: EM | Admit: 2023-09-01 | Discharge: 2023-09-09 | DRG: 871 | Disposition: A | Discharge status: Hospice | Source: Skilled Nursing Facility | Attending: Internal Medicine | Admitting: Internal Medicine

## 2023-09-01 DIAGNOSIS — E876 Hypokalemia: Secondary | ICD-10-CM | POA: Diagnosis present

## 2023-09-01 DIAGNOSIS — F32A Depression, unspecified: Secondary | ICD-10-CM | POA: Diagnosis present

## 2023-09-01 DIAGNOSIS — E7800 Pure hypercholesterolemia, unspecified: Secondary | ICD-10-CM | POA: Diagnosis present

## 2023-09-01 DIAGNOSIS — Z95810 Presence of automatic (implantable) cardiac defibrillator: Secondary | ICD-10-CM

## 2023-09-01 DIAGNOSIS — E1122 Type 2 diabetes mellitus with diabetic chronic kidney disease: Secondary | ICD-10-CM | POA: Diagnosis present

## 2023-09-01 DIAGNOSIS — G928 Other toxic encephalopathy: Secondary | ICD-10-CM | POA: Diagnosis present

## 2023-09-01 DIAGNOSIS — R531 Weakness: Secondary | ICD-10-CM | POA: Diagnosis present

## 2023-09-01 DIAGNOSIS — Z794 Long term (current) use of insulin: Secondary | ICD-10-CM | POA: Diagnosis not present

## 2023-09-01 DIAGNOSIS — R7881 Bacteremia: Secondary | ICD-10-CM | POA: Diagnosis present

## 2023-09-01 DIAGNOSIS — R410 Disorientation, unspecified: Secondary | ICD-10-CM | POA: Diagnosis not present

## 2023-09-01 DIAGNOSIS — R4701 Aphasia: Secondary | ICD-10-CM | POA: Diagnosis present

## 2023-09-01 DIAGNOSIS — E119 Type 2 diabetes mellitus without complications: Secondary | ICD-10-CM | POA: Diagnosis not present

## 2023-09-01 DIAGNOSIS — K219 Gastro-esophageal reflux disease without esophagitis: Secondary | ICD-10-CM | POA: Diagnosis present

## 2023-09-01 DIAGNOSIS — R1310 Dysphagia, unspecified: Secondary | ICD-10-CM | POA: Diagnosis present

## 2023-09-01 DIAGNOSIS — R4182 Altered mental status, unspecified: Secondary | ICD-10-CM

## 2023-09-01 DIAGNOSIS — Z515 Encounter for palliative care: Secondary | ICD-10-CM

## 2023-09-01 DIAGNOSIS — Z8673 Personal history of transient ischemic attack (TIA), and cerebral infarction without residual deficits: Secondary | ICD-10-CM

## 2023-09-01 DIAGNOSIS — I428 Other cardiomyopathies: Secondary | ICD-10-CM | POA: Diagnosis present

## 2023-09-01 DIAGNOSIS — E1142 Type 2 diabetes mellitus with diabetic polyneuropathy: Secondary | ICD-10-CM | POA: Diagnosis present

## 2023-09-01 DIAGNOSIS — A4151 Sepsis due to Escherichia coli [E. coli]: Principal | ICD-10-CM | POA: Diagnosis present

## 2023-09-01 DIAGNOSIS — I130 Hypertensive heart and chronic kidney disease with heart failure and stage 1 through stage 4 chronic kidney disease, or unspecified chronic kidney disease: Secondary | ICD-10-CM | POA: Diagnosis present

## 2023-09-01 DIAGNOSIS — E860 Dehydration: Secondary | ICD-10-CM | POA: Diagnosis present

## 2023-09-01 DIAGNOSIS — Z888 Allergy status to other drugs, medicaments and biological substances status: Secondary | ICD-10-CM

## 2023-09-01 DIAGNOSIS — Z66 Do not resuscitate: Secondary | ICD-10-CM | POA: Diagnosis present

## 2023-09-01 DIAGNOSIS — N179 Acute kidney failure, unspecified: Secondary | ICD-10-CM | POA: Diagnosis present

## 2023-09-01 DIAGNOSIS — I5020 Unspecified systolic (congestive) heart failure: Secondary | ICD-10-CM | POA: Diagnosis not present

## 2023-09-01 DIAGNOSIS — I5022 Chronic systolic (congestive) heart failure: Secondary | ICD-10-CM | POA: Diagnosis present

## 2023-09-01 DIAGNOSIS — Z79899 Other long term (current) drug therapy: Secondary | ICD-10-CM

## 2023-09-01 DIAGNOSIS — Z87442 Personal history of urinary calculi: Secondary | ICD-10-CM

## 2023-09-01 DIAGNOSIS — Z7984 Long term (current) use of oral hypoglycemic drugs: Secondary | ICD-10-CM

## 2023-09-01 DIAGNOSIS — N390 Urinary tract infection, site not specified: Secondary | ICD-10-CM | POA: Diagnosis present

## 2023-09-01 DIAGNOSIS — I479 Paroxysmal tachycardia, unspecified: Secondary | ICD-10-CM | POA: Diagnosis present

## 2023-09-01 DIAGNOSIS — G4733 Obstructive sleep apnea (adult) (pediatric): Secondary | ICD-10-CM | POA: Diagnosis present

## 2023-09-01 DIAGNOSIS — F419 Anxiety disorder, unspecified: Secondary | ICD-10-CM | POA: Diagnosis present

## 2023-09-01 DIAGNOSIS — E669 Obesity, unspecified: Secondary | ICD-10-CM | POA: Diagnosis present

## 2023-09-01 DIAGNOSIS — Z1152 Encounter for screening for COVID-19: Secondary | ICD-10-CM

## 2023-09-01 DIAGNOSIS — Z7401 Bed confinement status: Secondary | ICD-10-CM

## 2023-09-01 DIAGNOSIS — R112 Nausea with vomiting, unspecified: Secondary | ICD-10-CM | POA: Diagnosis present

## 2023-09-01 DIAGNOSIS — A419 Sepsis, unspecified organism: Secondary | ICD-10-CM | POA: Diagnosis not present

## 2023-09-01 DIAGNOSIS — N181 Chronic kidney disease, stage 1: Secondary | ICD-10-CM | POA: Diagnosis present

## 2023-09-01 DIAGNOSIS — R6521 Severe sepsis with septic shock: Secondary | ICD-10-CM | POA: Diagnosis present

## 2023-09-01 DIAGNOSIS — Z8739 Personal history of other diseases of the musculoskeletal system and connective tissue: Secondary | ICD-10-CM

## 2023-09-01 DIAGNOSIS — B9620 Unspecified Escherichia coli [E. coli] as the cause of diseases classified elsewhere: Secondary | ICD-10-CM | POA: Diagnosis present

## 2023-09-01 DIAGNOSIS — I493 Ventricular premature depolarization: Secondary | ICD-10-CM | POA: Diagnosis present

## 2023-09-01 LAB — COMPREHENSIVE METABOLIC PANEL WITH GFR
ALT: 19 U/L (ref 0–44)
AST: 19 U/L (ref 15–41)
Albumin: 3.2 g/dL — ABNORMAL LOW (ref 3.5–5.0)
Alkaline Phosphatase: 64 U/L (ref 38–126)
Anion gap: 8 (ref 5–15)
BUN: 34 mg/dL — ABNORMAL HIGH (ref 8–23)
CO2: 27 mmol/L (ref 22–32)
Calcium: 9.4 mg/dL (ref 8.9–10.3)
Chloride: 103 mmol/L (ref 98–111)
Creatinine, Ser: 1.18 mg/dL — ABNORMAL HIGH (ref 0.44–1.00)
GFR, Estimated: 47 mL/min — ABNORMAL LOW (ref >60)
Glucose, Bld: 208 mg/dL — ABNORMAL HIGH (ref 70–99)
Potassium: 4.2 mmol/L (ref 3.5–5.1)
Sodium: 138 mmol/L (ref 135–145)
Total Bilirubin: 0.9 mg/dL (ref 0.0–1.2)
Total Protein: 6.9 g/dL (ref 6.5–8.1)

## 2023-09-01 LAB — BLOOD CULTURE ID PANEL (REFLEXED) - BCID2

## 2023-09-01 LAB — BLOOD GAS, ARTERIAL
Acid-base deficit: 1.1 mmol/L (ref 0.0–2.0)
Bicarbonate: 23.5 mmol/L (ref 20.0–28.0)
O2 Saturation: 100 %
Patient temperature: 37
pCO2 arterial: 38 mmHg (ref 32–48)
pH, Arterial: 7.4 (ref 7.35–7.45)
pO2, Arterial: 86 mmHg (ref 83–108)

## 2023-09-01 LAB — URINALYSIS, W/ REFLEX TO CULTURE (INFECTION SUSPECTED)
Bilirubin Urine: NEGATIVE
Glucose, UA: NEGATIVE mg/dL
Ketones, ur: 5 mg/dL — AB
Nitrite: NEGATIVE
Protein, ur: 100 mg/dL — AB
Specific Gravity, Urine: 1.018 (ref 1.005–1.030)
Squamous Epithelial / HPF: 0 /HPF (ref 0–5)
WBC, UA: >50 WBC/hpf (ref 0–5)
pH: 5 (ref 5.0–8.0)

## 2023-09-01 LAB — LACTIC ACID, PLASMA
Lactic Acid, Venous: 1.5 mmol/L (ref 0.5–1.9)
Lactic Acid, Venous: 2.4 mmol/L (ref 0.5–1.9)
Lactic Acid, Venous: 1.6 mmol/L (ref 0.5–1.9)
Lactic Acid, Venous: 1.2 mmol/L (ref 0.5–1.9)

## 2023-09-01 LAB — MRSA NEXT GEN BY PCR, NASAL: MRSA by PCR Next Gen: NOT DETECTED

## 2023-09-01 LAB — CBC WITH DIFFERENTIAL/PLATELET
Abs Immature Granulocytes: 0.08 10*3/uL — ABNORMAL HIGH (ref 0.00–0.07)
Basophils Absolute: 0 10*3/uL (ref 0.0–0.1)
Basophils Relative: 0 %
Eosinophils Absolute: 0 10*3/uL (ref 0.0–0.5)
Eosinophils Relative: 0 %
HCT: 33.4 % — ABNORMAL LOW (ref 36.0–46.0)
Hemoglobin: 11.3 g/dL — ABNORMAL LOW (ref 12.0–15.0)
Immature Granulocytes: 1 %
Lymphocytes Relative: 5 %
Lymphs Abs: 0.6 10*3/uL — ABNORMAL LOW (ref 0.7–4.0)
MCH: 31.5 pg (ref 26.0–34.0)
MCHC: 33.8 g/dL (ref 30.0–36.0)
MCV: 93 fL (ref 80.0–100.0)
Monocytes Absolute: 1.2 10*3/uL — ABNORMAL HIGH (ref 0.1–1.0)
Monocytes Relative: 10 %
Neutro Abs: 10.2 10*3/uL — ABNORMAL HIGH (ref 1.7–7.7)
Neutrophils Relative %: 84 %
Platelets: 137 10*3/uL — ABNORMAL LOW (ref 150–400)
RBC: 3.59 MIL/uL — ABNORMAL LOW (ref 3.87–5.11)
RDW: 12.6 % (ref 11.5–15.5)
WBC: 12 10*3/uL — ABNORMAL HIGH (ref 4.0–10.5)
nRBC: 0 % (ref 0.0–0.2)

## 2023-09-01 LAB — BASIC METABOLIC PANEL WITH GFR
Anion gap: 12 (ref 5–15)
Anion gap: 13 (ref 5–15)
BUN: 33 mg/dL — ABNORMAL HIGH (ref 8–23)
BUN: 35 mg/dL — ABNORMAL HIGH (ref 8–23)
CO2: 21 mmol/L — ABNORMAL LOW (ref 22–32)
CO2: 20 mmol/L — ABNORMAL LOW (ref 22–32)
Calcium: 9.1 mg/dL (ref 8.9–10.3)
Calcium: 9.3 mg/dL (ref 8.9–10.3)
Chloride: 105 mmol/L (ref 98–111)
Chloride: 105 mmol/L (ref 98–111)
Creatinine, Ser: 1.15 mg/dL — ABNORMAL HIGH (ref 0.44–1.00)
Creatinine, Ser: 1.11 mg/dL — ABNORMAL HIGH (ref 0.44–1.00)
GFR, Estimated: 49 mL/min — ABNORMAL LOW (ref >60)
GFR, Estimated: 51 mL/min — ABNORMAL LOW (ref >60)
Glucose, Bld: 117 mg/dL — ABNORMAL HIGH (ref 70–99)
Glucose, Bld: 173 mg/dL — ABNORMAL HIGH (ref 70–99)
Potassium: 2.7 mmol/L — CL (ref 3.5–5.1)
Potassium: 3.2 mmol/L — ABNORMAL LOW (ref 3.5–5.1)
Sodium: 138 mmol/L (ref 135–145)
Sodium: 138 mmol/L (ref 135–145)

## 2023-09-01 LAB — GLUCOSE, CAPILLARY
Glucose-Capillary: 109 mg/dL — ABNORMAL HIGH (ref 70–99)
Glucose-Capillary: 113 mg/dL — ABNORMAL HIGH (ref 70–99)
Glucose-Capillary: 184 mg/dL — ABNORMAL HIGH (ref 70–99)
Glucose-Capillary: 198 mg/dL — ABNORMAL HIGH (ref 70–99)

## 2023-09-01 LAB — TROPONIN I (HIGH SENSITIVITY)
Troponin I (High Sensitivity): 19 ng/L — ABNORMAL HIGH (ref <18)
Troponin I (High Sensitivity): 16 ng/L (ref <18)

## 2023-09-01 LAB — RESP PANEL BY RT-PCR (RSV, FLU A&B, COVID)  RVPGX2
Influenza A by PCR: NEGATIVE
Influenza B by PCR: NEGATIVE
Resp Syncytial Virus by PCR: NEGATIVE
SARS Coronavirus 2 by RT PCR: NEGATIVE

## 2023-09-01 LAB — BLOOD GAS, VENOUS
Acid-base deficit: 3.3 mmol/L — ABNORMAL HIGH (ref 0.0–2.0)
Bicarbonate: 23.2 mmol/L (ref 20.0–28.0)
O2 Saturation: 60.8 %
Patient temperature: 37
pCO2, Ven: 46 mmHg (ref 44–60)
pH, Ven: 7.31 (ref 7.25–7.43)
pO2, Ven: 38 mmHg (ref 32–45)

## 2023-09-01 LAB — BRAIN NATRIURETIC PEPTIDE: B Natriuretic Peptide: 62.9 pg/mL (ref 0.0–100.0)

## 2023-09-01 LAB — CK: Total CK: 151 U/L (ref 38–234)

## 2023-09-01 LAB — BETA-HYDROXYBUTYRIC ACID: Beta-Hydroxybutyric Acid: 0.86 mmol/L — ABNORMAL HIGH (ref 0.05–0.27)

## 2023-09-01 LAB — TSH: TSH: 1.044 u[IU]/mL (ref 0.350–4.500)

## 2023-09-01 LAB — PHOSPHORUS: Phosphorus: 4 mg/dL (ref 2.5–4.6)

## 2023-09-01 LAB — MAGNESIUM: Magnesium: 1.3 mg/dL — ABNORMAL LOW (ref 1.7–2.4)

## 2023-09-01 MED ORDER — ACETAMINOPHEN 500 MG PO TABS
500.0000 mg | ORAL_TABLET | Freq: Four times a day (QID) | ORAL | Status: DC | PRN
  Administered 2023-09-02 – 2023-09-03 (×3): 500 mg via ORAL
  Filled 2023-09-01 (×4): qty 1

## 2023-09-01 MED ORDER — SODIUM CHLORIDE 0.9 % IV SOLN: 250.0000 mL | INTRAVENOUS | Status: DC

## 2023-09-01 MED ORDER — SODIUM CHLORIDE 0.9 % IV SOLN
1.0000 g | INTRAVENOUS | Status: AC
  Administered 2023-09-01: 1 g via INTRAVENOUS
  Filled 2023-09-01: qty 10

## 2023-09-01 MED ORDER — HYDROCORTISONE SOD SUC (PF) 100 MG IJ SOLR
100.0000 mg | Freq: Two times a day (BID) | INTRAMUSCULAR | Status: DC
  Administered 2023-09-01 – 2023-09-02 (×2): 100 mg via INTRAVENOUS
  Filled 2023-09-01 (×3): qty 2

## 2023-09-01 MED ORDER — DOCUSATE SODIUM 100 MG PO CAPS
100.0000 mg | ORAL_CAPSULE | Freq: Two times a day (BID) | ORAL | Status: DC
  Filled 2023-09-01 (×3): qty 1

## 2023-09-01 MED ORDER — ACETAMINOPHEN 500 MG PO TABS: 500.0000 mg | ORAL_TABLET | Freq: Four times a day (QID) | ORAL | Status: DC | PRN

## 2023-09-01 MED ORDER — SENNA 8.6 MG PO TABS
1.0000 | ORAL_TABLET | Freq: Every day | ORAL | Status: DC
  Filled 2023-09-01: qty 1

## 2023-09-01 MED ORDER — POLYETHYLENE GLYCOL 3350 17 G PO PACK: 17.0000 g | PACK | Freq: Every day | ORAL | Status: DC

## 2023-09-01 MED ORDER — TRAZODONE HCL 50 MG PO TABS: 25.0000 mg | ORAL_TABLET | Freq: Every evening | ORAL | Status: DC | PRN

## 2023-09-01 MED ORDER — SODIUM CHLORIDE 0.9 % IV BOLUS
1000.0000 mL | Freq: Once | INTRAVENOUS | Status: AC
  Administered 2023-09-01: 1000 mL via INTRAVENOUS

## 2023-09-01 MED ORDER — PANTOPRAZOLE SODIUM 40 MG PO TBEC
40.0000 mg | DELAYED_RELEASE_TABLET | Freq: Every day | ORAL | Status: DC
  Administered 2023-09-02 – 2023-09-03 (×2): 40 mg via ORAL
  Filled 2023-09-01 (×2): qty 1

## 2023-09-01 MED ORDER — MIDODRINE HCL 5 MG PO TABS: 2.5000 mg | ORAL_TABLET | Freq: Three times a day (TID) | ORAL | Status: DC

## 2023-09-01 MED ORDER — HEPARIN SODIUM (PORCINE) 5000 UNIT/ML IJ SOLN
5000.0000 [IU] | Freq: Two times a day (BID) | INTRAMUSCULAR | Status: DC
  Administered 2023-09-01 – 2023-09-02 (×3): 5000 [IU] via SUBCUTANEOUS
  Filled 2023-09-01 (×3): qty 1

## 2023-09-01 MED ORDER — NOREPINEPHRINE 4 MG/250ML-% IV SOLN: 0.0000 ug/min | INTRAVENOUS | Status: DC

## 2023-09-01 MED ORDER — ONDANSETRON HCL 4 MG/2ML IJ SOLN
4.0000 mg | Freq: Four times a day (QID) | INTRAMUSCULAR | Status: DC | PRN
  Administered 2023-09-04: 4 mg via INTRAVENOUS
  Filled 2023-09-01: qty 2

## 2023-09-01 MED ORDER — ACETAMINOPHEN 10 MG/ML IV SOLN: 1000.0000 mg | Freq: Four times a day (QID) | INTRAVENOUS | Status: DC

## 2023-09-01 MED ORDER — CHLORHEXIDINE GLUCONATE CLOTH 2 % EX PADS
6.0000 | MEDICATED_PAD | Freq: Every day | CUTANEOUS | Status: DC
  Administered 2023-09-01 – 2023-09-02 (×2): 6 via TOPICAL

## 2023-09-01 MED ORDER — PIPERACILLIN-TAZOBACTAM 3.375 G IVPB
3.3750 g | Freq: Three times a day (TID) | INTRAVENOUS | Status: DC
  Administered 2023-09-01: 3.375 g via INTRAVENOUS
  Filled 2023-09-01: qty 50

## 2023-09-01 MED ORDER — INSULIN ASPART 100 UNIT/ML IJ SOLN: 0.0000 [IU] | Freq: Every day | INTRAMUSCULAR | Status: DC

## 2023-09-01 MED ORDER — LACTATED RINGERS IV BOLUS
500.0000 mL | Freq: Once | INTRAVENOUS | Status: AC
  Administered 2023-09-01: 500 mL via INTRAVENOUS

## 2023-09-01 MED ORDER — INSULIN ASPART 100 UNIT/ML IJ SOLN
0.0000 [IU] | Freq: Three times a day (TID) | INTRAMUSCULAR | Status: DC
  Administered 2023-09-01: 5 [IU] via SUBCUTANEOUS
  Administered 2023-09-02: 2 [IU] via SUBCUTANEOUS
  Filled 2023-09-01 (×2): qty 1

## 2023-09-01 MED ORDER — SODIUM CHLORIDE 0.9 % IV SOLN
2.0000 g | INTRAVENOUS | Status: DC
  Administered 2023-09-02 – 2023-09-04 (×3): 2 g via INTRAVENOUS
  Filled 2023-09-01 (×3): qty 20

## 2023-09-01 MED ORDER — HYDRALAZINE HCL 20 MG/ML IJ SOLN: 5.0000 mg | Freq: Four times a day (QID) | INTRAMUSCULAR | Status: DC | PRN

## 2023-09-01 MED ORDER — MAGNESIUM SULFATE 2 GM/50ML IV SOLN
2.0000 g | Freq: Once | INTRAVENOUS | Status: AC
  Administered 2023-09-01: 2 g via INTRAVENOUS
  Filled 2023-09-01: qty 50

## 2023-09-01 MED ORDER — SODIUM CHLORIDE 0.9 % IV SOLN: INTRAVENOUS | Status: DC

## 2023-09-01 MED ORDER — ACETAMINOPHEN 10 MG/ML IV SOLN
1000.0000 mg | Freq: Four times a day (QID) | INTRAVENOUS | Status: DC
  Administered 2023-09-01 – 2023-09-02 (×2): 1000 mg via INTRAVENOUS
  Filled 2023-09-01 (×5): qty 100

## 2023-09-01 MED ORDER — IOHEXOL 350 MG/ML SOLN
75.0000 mL | Freq: Once | INTRAVENOUS | Status: AC | PRN
  Administered 2023-09-01: 60 mL via INTRAVENOUS

## 2023-09-01 MED ORDER — ACETAMINOPHEN 10 MG/ML IV SOLN
1000.0000 mg | Freq: Once | INTRAVENOUS | Status: AC
  Administered 2023-09-01: 1000 mg via INTRAVENOUS
  Filled 2023-09-01 (×2): qty 100

## 2023-09-01 MED ORDER — SODIUM CHLORIDE 0.9 % IV SOLN: 1.0000 g | INTRAVENOUS | Status: DC

## 2023-09-01 MED ORDER — NOREPINEPHRINE 4 MG/250ML-% IV SOLN
INTRAVENOUS | Status: AC
  Administered 2023-09-01: 2 ug/min via INTRAVENOUS
  Filled 2023-09-01: qty 250

## 2023-09-01 NOTE — ED Notes (Signed)
 [place] at bedside

## 2023-09-01 NOTE — Consult Note (Signed)
 CODE SEPSIS - PHARMACY COMMUNICATION  **Broad Spectrum Antibiotics should be administered within 1 hour of Sepsis diagnosis**  Time Code Sepsis Called/Page Received: 1004  Antibiotics Ordered: Ceftriaxone   Time of 1st antibiotic administration: 1018  Additional action taken by pharmacy: N/A  If necessary, Name of Provider/Nurse Contacted: N/A  Pansy Bogus, PharmD Pharmacy Resident  09/01/2023 10:16 AM

## 2023-09-01 NOTE — Evaluation (Signed)
 Clinical/Bedside Swallow Evaluation Patient Details  Name: Emma Guzman MRN: [id number] Date of Birth: [birth year]  Today's Date: 09/01/2023 Time: SLP Start Time (ACUTE ONLY): 1055 SLP Stop Time (ACUTE ONLY): 1100 SLP Time Calculation (min) (ACUTE ONLY): 5 min  Past Medical History:  Past Medical History:  Diagnosis Date   Arthritis    "everywhere"   Cardiomyopathy (HCC)    CHF (congestive heart failure) (HCC)    Chronic kidney disease    stones   Diabetes mellitus without complication (HCC)    diet controlled   GERD (gastroesophageal reflux disease)    Hard of hearing    Hypercholesteremia    Hypertension    Incontinence    Obesity    Sleep apnea    No CPAP since sinus surgery several yrs ago   Wears dentures    partial upper   Past Surgical History:  Past Surgical History:  Procedure Laterality Date   CATARACT EXTRACTION W/PHACO Left 03/14/2015   Procedure: CATARACT EXTRACTION PHACO AND INTRAOCULAR LENS PLACEMENT (IOC);  Surgeon: Billee Buddle, MD;  Location: [facility];  Service: Ophthalmology;  Laterality: Left;  DIABETIC - diet controlled   DENTAL SURGERY     IMPLANTABLE CARDIOVERTER DEFIBRILLATOR (ICD) GENERATOR CHANGE N/A 02/15/2015   Procedure: ICD GENERATOR CHANGE;  Surgeon: Percival Brace, MD;  Location: [facility] ORS;  Service: Cardiovascular;  Laterality: N/A;   KIDNEY STONE SURGERY     PACEMAKER INSERTION     TONSILLECTOMY     HPI:  Patient is a 78 y.o. female who presented 09/01/23 from [facility] due to declining over the past several days. 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  Superimposed patchy and confluent bilateral cerebral white matter hypodensity elsewhere, gray-white differentiation elsewhere appears  stable." Recent admission 5/2-08/07/23 with rhabdomylosis, acute metabolic encephalopathy. Past medical history includes  frequent falls, CHF, CKD, T2DM, GERD, HTN, OSA. No prior history of dysphagia found in chart.    Assessment / Plan / Recommendation  Clinical Impression   Patient presents with increased risk for aspiration in the setting of cognitive decline and recent L basal ganglia infarct. Limited assessment as patient was taken to CT, however initial assessment reveals diminished ability to follow commands (is also very HOH), slow movements, hypophonia (barely audible), and right sided facial weakness. She is able to respond appropriately to simple Y/N questions. With small ice chip, she made minimal movements for oral manipulation, and appeared as if she wanted to expectorate but unable to do so when prompted. Did not initiate pharyngeal swallow. Further assessment was not attempted, and transport arrived to take pt to CT. Recommend she remain NPO, discussed recommendations and follow-up plan with pt's son and RN.   SLP Visit Diagnosis: Dysphagia, oropharyngeal phase (R13.12)    Aspiration Risk  Severe aspiration risk    Diet Recommendation NPO         Other  Recommendations Oral Care Recommendations: Oral care QID Caregiver Recommendations: Remove water pitcher;Have oral suction available    Recommendations for follow up therapy are one component of a multi-disciplinary discharge planning process, led by the attending physician.  Recommendations may be updated based on patient status, additional functional criteria and insurance authorization.  Follow up Recommendations Other (comment) (tbd)      Assistance Recommended at Discharge    Functional Status Assessment Patient has had a recent decline in their functional status and/or demonstrates limited ability  to make significant improvements in function in a reasonable and predictable amount of time  Frequency and Duration min 2x/week  2 weeks       Prognosis Prognosis for improved oropharyngeal function: Fair Barriers to Reach Goals: Cognitive  deficits      Swallow Study   General Date of Onset: 09/01/23 HPI: Patient is a 78 y.o. female who presented 09/01/23 from [facility] due to declining over the past several days. 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  Superimposed patchy and confluent bilateral cerebral white matter hypodensity elsewhere, gray-white differentiation elsewhere appears  stable." Recent admission 5/2-08/07/23 with rhabdomylosis, acute metabolic encephalopathy. Past medical history includes frequent falls, CHF, CKD, T2DM, GERD, HTN, OSA. No prior history of dysphagia found in chart. Type of Study: Bedside Swallow Evaluation Previous Swallow Assessment: none on record Diet Prior to this Study: NPO Temperature Spikes Noted: No Respiratory Status: Room air History of Recent Intubation: No Behavior/Cognition: Alert;Confused;Requires cueing;Distractible Oral Cavity Assessment: Dry Oral Care Completed by SLP: No Oral Cavity - Dentition:  (appears edentulous but difficult to assess as pt not able to follow all commands) Vision:  (UTA) Self-Feeding Abilities: Total assist Patient Positioning: Upright in bed Baseline Vocal Quality: Low vocal intensity (barely audible) Volitional Cough: Cognitively unable to elicit Volitional Swallow: Unable to elicit (cognitively unable)    Oral/Motor/Sensory Function Overall Oral Motor/Sensory Function: Moderate impairment Facial ROM: Reduced right;Suspected CN VII (facial) dysfunction Facial Symmetry: Abnormal symmetry right Facial Strength: Reduced right Lingual ROM: Other (Comment) (difficult to assess; client opens mouth but does not protrude tongue on command) Lingual Symmetry: Other (Comment) (no obvious asymmetry) Lingual Strength: Reduced   Ice Chips Ice chips: Impaired Presentation: Spoon Oral Phase Impairments: Reduced labial seal;Reduced lingual  movement/coordination;Poor awareness of bolus Oral Phase Functional Implications: Prolonged oral transit Pharyngeal Phase Impairments: Unable to trigger swallow   Thin Liquid Thin Liquid: Not tested    Nectar Thick Nectar Thick Liquid: Not tested   Honey Thick Honey Thick Liquid: Not tested   Puree Puree: Not tested   Solid    Scheryl Cushing, MS, CCC-SLP Speech-Language Pathologist Office: [phone] [facility]: [phone]  Solid: Not tested      Luster Salters 09/01/2023,11:16 AM

## 2023-09-01 NOTE — ED Notes (Signed)
Dr. Zhang at bedside.  

## 2023-09-01 NOTE — ED Notes (Signed)
 Speech at bedside

## 2023-09-01 NOTE — Progress Notes (Signed)
 PHARMACY - PHYSICIAN COMMUNICATION CRITICAL VALUE ALERT - BLOOD CULTURE IDENTIFICATION (BCID)  Emma Guzman is an 78 y.o. female who presented to [facility] on 09/01/2023 with a chief complaint of septic shock  Assessment:   3 of 4 bottles (two aerobic, one anaerobic) E coli, no resistance genes detected  Name of physician (or Provider) Contacted: Enolia Hartmann  Current antibiotics: Zosyn  Changes to prescribed antibiotics recommended:  Recommendations accepted by provider Ceftriaxone  2g IV q24H  Results for orders placed or performed during the hospital encounter of 09/01/23  Blood Culture ID Panel (Reflexed) (Collected: 09/01/2023  6:51 AM)  Result Value Ref Range   Enterococcus faecalis NOT DETECTED NOT DETECTED   Enterococcus Faecium NOT DETECTED NOT DETECTED   Listeria monocytogenes NOT DETECTED NOT DETECTED   Staphylococcus species NOT DETECTED NOT DETECTED   Staphylococcus aureus (BCID) NOT DETECTED NOT DETECTED   Staphylococcus epidermidis NOT DETECTED NOT DETECTED   Staphylococcus lugdunensis NOT DETECTED NOT DETECTED   Streptococcus species NOT DETECTED NOT DETECTED   Streptococcus agalactiae NOT DETECTED NOT DETECTED   Streptococcus pneumoniae NOT DETECTED NOT DETECTED   Streptococcus pyogenes NOT DETECTED NOT DETECTED   A.calcoaceticus-baumannii NOT DETECTED NOT DETECTED   Bacteroides fragilis NOT DETECTED NOT DETECTED   Enterobacterales DETECTED (A) NOT DETECTED   Enterobacter cloacae complex NOT DETECTED NOT DETECTED   Escherichia coli DETECTED (A) NOT DETECTED   Klebsiella aerogenes NOT DETECTED NOT DETECTED   Klebsiella oxytoca NOT DETECTED NOT DETECTED   Klebsiella pneumoniae NOT DETECTED NOT DETECTED   Proteus species NOT DETECTED NOT DETECTED   Salmonella species NOT DETECTED NOT DETECTED   Serratia marcescens NOT DETECTED NOT DETECTED   Haemophilus influenzae NOT DETECTED NOT DETECTED   Neisseria meningitidis NOT DETECTED NOT DETECTED   Pseudomonas  aeruginosa NOT DETECTED NOT DETECTED   Stenotrophomonas maltophilia NOT DETECTED NOT DETECTED   Candida albicans NOT DETECTED NOT DETECTED   Candida auris NOT DETECTED NOT DETECTED   Candida glabrata NOT DETECTED NOT DETECTED   Candida krusei NOT DETECTED NOT DETECTED   Candida parapsilosis NOT DETECTED NOT DETECTED   Candida tropicalis NOT DETECTED NOT DETECTED   Cryptococcus neoformans/gattii NOT DETECTED NOT DETECTED   CTX-M ESBL NOT DETECTED NOT DETECTED   Carbapenem resistance IMP NOT DETECTED NOT DETECTED   Carbapenem resistance KPC NOT DETECTED NOT DETECTED   Carbapenem resistance NDM NOT DETECTED NOT DETECTED   Carbapenem resist OXA 48 LIKE NOT DETECTED NOT DETECTED   Carbapenem resistance VIM NOT DETECTED NOT DETECTED    [name] 09/01/2023  10:29 PM

## 2023-09-01 NOTE — TOC Initial Note (Signed)
 Transition of Care [facility]) - Initial/Assessment Note    Patient Details  Name: Emma Guzman MRN: [id number] Date of Birth: 31-Dec-1945  [facility] CM/SW Contact:    Crayton Docker, RN 09/01/2023, 11:09 AM  Clinical Narrative:                  CM to patient's room regarding TOC screening assessment. Patient to CT. Patient's son, [name] remains in room. Per patient's son, [name], anticipates for patient to return to [facility] upon discharge.   CM will continue to follow for discharge care planning needs.  Expected Discharge Plan: Skilled Nursing Facility Barriers to Discharge: Continued Medical Work up   Patient Goals and CMS Choice    SNF/Return to prior facility   Expected Discharge Plan and Services    SNF/Return to prior facility           Prior Living Arrangements/Services    Prior to SNF, lived alone, used cane and rollator        Current home services: DME (rollator and cane; previous HH and SNF experience)    Activities of Daily Living   ADL Screening (condition at time of admission) Independently performs ADLs?: No Does the patient have a NEW difficulty with bathing/dressing/toileting/self-feeding that is expected to last >3 days?: Yes (Initiates electronic notice to provider for possible OT consult) Does the patient have a NEW difficulty with getting in/out of bed, walking, or climbing stairs that is expected to last >3 days?: Yes (Initiates electronic notice to provider for possible PT consult) Does the patient have a NEW difficulty with communication that is expected to last >3 days?: No Is the patient deaf or have difficulty hearing?: No Does the patient have difficulty seeing, even when wearing glasses/contacts?: No Does the patient have difficulty concentrating, remembering, or making decisions?: Yes  Permission Sought/Granted      Share Information with NAME: Srija Southard     Permission granted to share info w  Relationship: Son  Permission granted to share info w Contact Information: Yes  Emotional Assessment Appearance:: Appears older than stated age            Admission diagnosis:  AMS (altered mental status) [R41.82] Patient Active Problem List   Diagnosis Date Noted   AMS (altered mental status) 09/01/2023   Hypertensive urgency 08/03/2023   Type 2 diabetes mellitus without complications (HCC) 08/03/2023   GERD without esophagitis 08/03/2023   Depression 08/03/2023   Rhabdomyolysis 08/02/2023   DKA (diabetic ketoacidosis) (HCC) 10/24/2022   Diabetes mellitus without complication (HCC) 10/24/2022   Elevated CK 10/24/2022   UTI (urinary tract infection) 10/24/2022   Elevated lactic acid level 10/24/2022   Fall at home, initial encounter 10/24/2022   Chronic systolic CHF (congestive heart failure) (HCC) 10/24/2022   Hypertension    Hypercholesteremia    Apnea, sleep 04/07/2014   Cardiac defibrillator in place 04/07/2014   Cardiomyopathy (HCC) 04/07/2014   Acid reflux 04/07/2014   Morbid obesity (HCC) 05/12/2008   CARDIOMYOPATHY, PRIMARY, DILATED 05/12/2008   LBBB 05/12/2008   SYSTOLIC HEART FAILURE, ACUTE ON CHRONIC 05/12/2008   Implantable cardioverter-defibrillator (ICD) in situ 05/12/2008   Automatic implantable cardioverter-defibrillator in situ 05/12/2008   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 [place] [address] Phone: [phone] Fax: [phone]     Social  Drivers of Health (SDOH) Social History: SDOH Screenings   Food Insecurity: Patient Unable To Answer (09/01/2023)  Housing: Patient Unable To Answer (09/01/2023)  Transportation Needs: Patient Unable To Answer (09/01/2023)  Utilities: Patient Unable To Answer (09/01/2023)  Financial Resource Strain: Low Risk  (09/15/2021)   Received from [facility]  Physical Activity: Inactive (09/15/2021)   Received from [facility]  Social Connections: Patient Unable To Answer (09/01/2023)  Stress: Stress Concern Present (09/15/2021)   Received from [facility]  Tobacco Use: Low Risk  (08/02/2023)   SDOH Interventions:     Readmission Risk Interventions     No data to display

## 2023-09-01 NOTE — Consult Note (Signed)
 NAME:  Emma Guzman, MRN:  [id number], DOB:  11/27/1945, LOS: 0 ADMISSION DATE:  09/01/2023, CONSULTATION DATE:  09/01/2023 REFERRING MD:  Antoniette Batty, MD, CHIEF COMPLAINT:  Septic Shock   History of Present Illness:   Patient is a 78 year old female with a past medical history of HFrEF status post ICD, CKD, diabetes, and hypertension who presents to the hospital with weakness and altered mental status.  Patient is unable to provide history and she is confused and unresponsive.  History was obtained from review of the medical record.  Patient's son had found her confused and unable to speak prompting presentation to the emergency department.  This was a subacute decline over the past month with sudden worsening over the last 2 days.  On presentation to the emergency department, she was afebrile but with soft blood pressure.  She had an AKI and urinalysis that was suggestive of a UTI.  Head imaging was unremarkable.  She was given IV antibiotics and admitted to the medicine service.  Renal ultrasound does not show any hydronephrosis but does suggest cystitis.  Urine and blood cultures are pending.  Following admission to the floor, patient was persistently hypotensive despite volume resuscitation.  She was transferred to the ICU for initiation of vasopressor support.  Review of the medical record notes recent hospitalization to [facility] 08/02/2023 - 08/07/2023 following an episode of altered mental status and fall resulting in rhabdomyolysis.  Patient previously treated for DKA in July 2024  Pertinent  Medical History  -HFrEF -Insulin -dependent diabetes -Status post AICD  Significant Hospital Events: Including procedures, antibiotic start and stop dates in addition to other pertinent events   09/01/23: admit for encephalopathy, sepsis. Transfer to ICU  Interim History / Subjective:  Encephalopathic, no purposeful movement.  Objective    Blood pressure (!) 87/48, pulse 73, temperature (!) 100.8  F (38.2 C), temperature source Rectal, resp. rate 14, height 5\' 1"  (1.549 m), weight 54.3 kg, SpO2 98%.       No intake or output data in the 24 hours ending 09/01/23 1724 Filed Weights   09/01/23 0641  Weight: 54.3 kg    Examination: Physical Exam Constitutional:      General: She is in acute distress.     Appearance: She is ill-appearing.  Cardiovascular:     Rate and Rhythm: Normal rate and regular rhythm.     Heart sounds: Normal heart sounds.  Pulmonary:     Breath sounds: Normal breath sounds.  Neurological:     Mental Status: She is disoriented.      Assessment and Plan   78 year old female with history of HFrEF s/p ICD presenting with altered mental status and found to have septic shock secondary to DKA.  #Septic Shock #Toxic Metabolic Encephalopathy #HFrEF s/p ICD #UTI #IDDM  Neuro - toxic metabolic encephalopathy in the setting of sepsis and septic shock. This could also be driven by acidosis or CO2 narcosis. Will check ABG and avoid psychotropic medications CV - history of HFrEF as well as ICD placement, thou no TTE since 2018. Will update. Currently on vasopressors to maintain MAP > 65. Will check lactic acid, ABG, and obtain a TTE. Pulm - patient on room air without signs of respiratory distress. Avoiding over resuscitation given concern for heart failure. Check ABG to assess acid base status GI - NPO for now, on PPI Renal - AKI in the setting of shock, s/p gentle fluid resuscitation. Repeat K with hypokalemia that we will confirm prior to  repletion. Check ABG to assess for acidosis. Endo - ICU glycemic protocol, hydrocortisone started for shock. History of DKA, but patient without hyperglycemia. Beta hydroxy mildly elevated which is better explained by starvation ketosis rather than DKA (given no acidosis, no hyperglycemia). Hem/Onc - heparin subQ for DVT prophylaxis ID - on Ceftriaxone  that we will broaden to Zosyn pending culture results.  Best Practice  (right click and "Reselect all SmartList Selections" daily)   Diet/type: NPO DVT prophylaxis prophylactic heparin  Pressure ulcer(s): N/A GI prophylaxis: PPI Lines: N/A Foley:  N/A Code Status:  DNR Last date of multidisciplinary goals of care discussion [09/01/2023]  Labs   CBC: Recent Labs  Lab 09/01/23 0651  WBC 12.0*  NEUTROABS 10.2*  HGB 11.3*  HCT 33.4*  MCV 93.0  PLT 137*    Basic Metabolic Panel: Recent Labs  Lab 09/01/23 0651 09/01/23 0849  NA 138  --   K 4.2  --   CL 103  --   CO2 27  --   GLUCOSE 208*  --   BUN 34*  --   CREATININE 1.18*  --   CALCIUM 9.4  --   MG  --  1.3*  PHOS  --  4.0   GFR: Estimated Creatinine Clearance: 29.6 mL/min (A) (by C-G formula based on SCr of 1.18 mg/dL (H)). Recent Labs  Lab 09/01/23 0651 09/01/23 0838  WBC 12.0*  --   LATICACIDVEN  --  1.5    Liver Function Tests: Recent Labs  Lab 09/01/23 0651  AST 19  ALT 19  ALKPHOS 64  BILITOT 0.9  PROT 6.9  ALBUMIN 3.2*   No results for input(s): "LIPASE", "AMYLASE" in the last 168 hours. No results for input(s): "AMMONIA" in the last 168 hours.  ABG    Component Value Date/Time   HCO3 20.6 10/24/2022 1434   ACIDBASEDEF 5.4 (H) 10/24/2022 1434   O2SAT 73 10/24/2022 1434     Coagulation Profile: No results for input(s): "INR", "PROTIME" in the last 168 hours.  Cardiac Enzymes: Recent Labs  Lab 09/01/23 0651  CKTOTAL 151    HbA1C: Hgb A1c MFr Bld  Date/Time Value Ref Range Status  08/03/2023 04:05 AM 8.1 (H) 4.8 - 5.6 % Final    Comment:    (NOTE) Pre diabetes:          5.7%-6.4%  Diabetes:              >6.4%  Glycemic control for   <7.0% adults with diabetes   10/24/2022 05:53 PM 11.6 (H) 4.8 - 5.6 % Final    Comment:    (NOTE) Pre diabetes:          5.7%-6.4%  Diabetes:              >6.4%  Glycemic control for   <7.0% adults with diabetes     CBG: Recent Labs  Lab 09/01/23 1646 09/01/23 1722  GLUCAP 113* 109*    Review of  Systems:   N/A  Past Medical History:  She,  has a past medical history of Arthritis, Cardiomyopathy (HCC), CHF (congestive heart failure) (HCC), Chronic kidney disease, Diabetes mellitus without complication (HCC), GERD (gastroesophageal reflux disease), Hard of hearing, Hypercholesteremia, Hypertension, Incontinence, Obesity, Sleep apnea, and Wears dentures.   Surgical History:   Past Surgical History:  Procedure Laterality Date   CATARACT EXTRACTION W/PHACO Left 03/14/2015   Procedure: CATARACT EXTRACTION PHACO AND INTRAOCULAR LENS PLACEMENT (IOC);  Surgeon: Billee Buddle, MD;  Location[facility];  Service: Ophthalmology;  Laterality: Left;  DIABETIC - diet controlled   DENTAL SURGERY     IMPLANTABLE CARDIOVERTER DEFIBRILLATOR (ICD) GENERATOR CHANGE N/A 02/15/2015   Procedure: ICD GENERATOR CHANGE;  Surgeon: Percival Brace, MD;  Location: [facility];  Service: Cardiovascular;  Laterality: N/A;   KIDNEY STONE SURGERY     PACEMAKER INSERTION     TONSILLECTOMY       Social History:   reports that she has never smoked. She has never used smokeless tobacco. She reports that she does not drink alcohol and does not use drugs.   Family History:  Her family history includes Cancer in her father and mother. There is no history of Breast cancer.   Allergies No Known Allergies   Home Medications  Prior to Admission medications   Medication Sig Start Date End Date Taking? Authorizing Provider  glipiZIDE (GLUCOTROL XL) 10 MG 24 hr tablet Take 10 mg by mouth 2 (two) times daily.   Yes [provider]  omeprazole (PRILOSEC) 20 MG capsule Take 20 mg by mouth at bedtime and may repeat dose one time if needed. Dr Parks Bollman   Yes [provider]  acetaminophen  (TYLENOL ) 500 MG tablet Take 500 mg by mouth every 6 (six) hours as needed.    [provider]  carvedilol  (COREG ) 25 MG tablet Take 25 mg by mouth 2 (two) times daily with a meal. Dr  Parks Bollman Patient not taking: Reported on 08/03/2023    [provider]  docusate sodium  (COLACE) 100 MG capsule Take 1 capsule (100 mg total) by mouth 2 (two) times daily. 10/29/22   Aisha Hove, MD  insulin  glargine-yfgn (SEMGLEE ) 100 UNIT/ML injection Inject 0.2 mLs (20 Units total) into the skin at bedtime. 08/07/23   Read Camel, MD  lisinopril  (ZESTRIL ) 10 MG tablet Take 1 tablet (10 mg total) by mouth daily. Dr Parks Bollman Patient not taking: Reported on 08/03/2023 10/29/22   Sreeram, Narendranath, MD  polyethylene glycol (MIRALAX  / GLYCOLAX ) 17 g packet Take 17 g by mouth daily. 10/29/22   Aisha Hove, MD  senna (SENOKOT) 8.6 MG TABS tablet Take 1 tablet (8.6 mg total) by mouth at bedtime. 10/29/22   Aisha Hove, MD  traZODone  (DESYREL ) 50 MG tablet Take 0.5 tablets (25 mg total) by mouth at bedtime as needed for sleep. 08/07/23 09/06/23  Read Camel, MD     Critical care time: 52 minutes    Vergia Glasgow, MD [facility] 09/01/2023 6:46 PM

## 2023-09-01 NOTE — Sepsis Progress Note (Signed)
 Elink following code sepsis

## 2023-09-01 NOTE — ED Notes (Signed)
 In and out cath performed, pt bladder drained. Brief still dry, pt covered with blankets.

## 2023-09-01 NOTE — ED Notes (Signed)
 Dr. Jeane Miguel made aware of heart rhythm

## 2023-09-01 NOTE — ED Notes (Signed)
 Pt taken to CT.

## 2023-09-01 NOTE — H&P (Signed)
 History and Physical    Emma Guzman XLK:[id number] DOB: 1945/06/05 DOA: 09/01/2023  PCP: Alexander Anes, MD (Confirm with patient/family/NH records and if not entered, this has to be entered at [facility] point of entry) Patient coming from: SNF  I have personally briefly reviewed patient's old medical records in [facility]  Chief Complaint: AMS, weakness  HPI: Emma Guzman is a 78 y.o. 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  Patient is confused and unable to provide any history, all history provided by son at bedside.  Son reported that the patient was hospitalized 1 month ago for fall and rhabdomyolysis, at baseline patient has ambulation impairment chronically, however on the discharge, patient was so weak that she has been not able to pass the visit any formal PT and lately she has been having trouble even to stand on her feet and mostly spent her time bedbound.  Son also reported that the patient has waxing waning of mentations, on the better days she could recognize her family members and engaged in short conversations however other occasions she could be totally confused.  Patient contacted nursing home yesterday and was told the patient has not been eat breakfast and probably not lunch either but no other details about whether patient has any complaints such as abdominal pain or diarrhea or nauseous vomiting.  This morning son found the patient is confused and " appears to have trouble with her wording, looking for words in her mouth"  ED Course: Afebrile, not tachycardia, borderline hypotension 97/58 nonhypoxic.  Blood work showed worsening of kidney function creatinine 1.1 compared to baseline 0.5 from last admission 4 weeks ago, BUN 34 UA showed 3+ WBC and positive nitrite, WBC 12.  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  Review of Systems: Unable to perform, patient is confused.  Past Medical History:  Diagnosis Date   Arthritis    "everywhere"   Cardiomyopathy [facility])    CHF (congestive heart failure) (HCC)    Chronic kidney disease    stones   Diabetes mellitus without complication (HCC)    diet controlled   GERD (gastroesophageal reflux disease)    Hard of hearing    Hypercholesteremia    Hypertension    Incontinence    Obesity    Sleep apnea    No CPAP since sinus surgery several yrs ago   Wears dentures    partial upper    Past Surgical History:  Procedure Laterality Date   CATARACT EXTRACTION W/PHACO Left 03/14/2015   Procedure: CATARACT EXTRACTION PHACO AND INTRAOCULAR LENS PLACEMENT (IOC);  Surgeon: Billee Buddle, MD;  Location: [facility];  Service: Ophthalmology;  Laterality: Left;  DIABETIC - diet controlled   DENTAL SURGERY     IMPLANTABLE CARDIOVERTER DEFIBRILLATOR (ICD) GENERATOR CHANGE N/A 02/15/2015   Procedure: ICD GENERATOR CHANGE;  Surgeon: Percival Brace, MD;  Location: [facility];  Service: Cardiovascular;  Laterality: N/A;   KIDNEY STONE SURGERY     PACEMAKER INSERTION     TONSILLECTOMY       reports that she has never smoked. She has never used smokeless tobacco. She reports that she does not drink alcohol and does not use drugs.  No Known Allergies  Family  History  Problem Relation Age of Onset   Cancer Mother    Cancer Father    Breast cancer Neg Hx      Prior to Admission medications   Medication Sig Start Date End Date Taking? Authorizing Provider  acetaminophen  (TYLENOL ) 500 MG tablet Take 500 mg by mouth every 6 (six) hours as needed.    [provider]  carvedilol  (COREG ) 25 MG tablet Take 25 mg by mouth 2 (two) times daily with a meal. Dr Parks Bollman Patient not taking: Reported on  08/03/2023    [provider]  docusate sodium  (COLACE) 100 MG capsule Take 1 capsule (100 mg total) by mouth 2 (two) times daily. 10/29/22   Aisha Hove, MD  glipiZIDE (GLUCOTROL XL) 10 MG 24 hr tablet Take 10 mg by mouth 2 (two) times daily.    [provider]  insulin  glargine-yfgn (SEMGLEE ) 100 UNIT/ML injection Inject 0.2 mLs (20 Units total) into the skin at bedtime. 08/07/23   Agbata, Tochukwu, MD  lisinopril  (ZESTRIL ) 10 MG tablet Take 1 tablet (10 mg total) by mouth daily. Dr Parks Bollman Patient not taking: Reported on 08/03/2023 10/29/22   Aisha Hove, MD  omeprazole (PRILOSEC) 20 MG capsule Take 20 mg by mouth at bedtime and may repeat dose one time if needed. Dr Parks Bollman    [provider]  polyethylene glycol (MIRALAX  / GLYCOLAX ) 17 g packet Take 17 g by mouth daily. 10/29/22   Aisha Hove, MD  senna (SENOKOT) 8.6 MG TABS tablet Take 1 tablet (8.6 mg total) by mouth at bedtime. 10/29/22   Aisha Hove, MD  traZODone  (DESYREL ) 50 MG tablet Take 0.5 tablets (25 mg total) by mouth at bedtime as needed for sleep. 08/07/23 09/06/23  Read Camel, MD    Physical Exam: Vitals:   09/01/23 0745 09/01/23 0800 09/01/23 0815 09/01/23 0830  BP: 107/62 108/71 118/69 107/78  Pulse: 85 85 88 89  Resp: 18 12 12 14   Temp:      TempSrc:      SpO2: 99% 100% 100% 100%  Weight:        Constitutional: NAD, calm, comfortable Vitals:   09/01/23 0745 09/01/23 0800 09/01/23 0815 09/01/23 0830  BP: 107/62 108/71 118/69 107/78  Pulse: 85 85 88 89  Resp: 18 12 12 14   Temp:      TempSrc:      SpO2: 99% 100% 100% 100%  Weight:       Eyes: PERRL, lids and conjunctivae normal ENMT: Mucous membranes are dry. Posterior pharynx clear of any exudate or lesions.Normal dentition.  Neck: normal, supple, no masses, no thyromegaly Respiratory: clear to auscultation bilaterally, no wheezing, no crackles. Normal respiratory effort. No accessory muscle use.   Cardiovascular: Regular rate and rhythm, no murmurs / rubs / gallops. No extremity edema. 2+ pedal pulses. No carotid bruits.  Abdomen: no tenderness, no masses palpated. No hepatosplenomegaly. Bowel sounds positive.  Musculoskeletal: no clubbing / cyanosis. No joint deformity upper and lower extremities. Good ROM, no contractures. Normal muscle tone.  Skin: no rashes, lesions, ulcers. No induration Neurologic: No facial droops, moving all limbs, not following commands Psychiatric: Awake, oriented to herself, confused about time and place, appears to have hard time to respond to her family members    Labs on Admission: I have personally reviewed following labs and imaging studies  CBC: Recent Labs  Lab 09/01/23 0651  WBC 12.0*  NEUTROABS 10.2*  HGB 11.3*  HCT 33.4*  MCV 93.0  PLT 137*  Basic Metabolic Panel: Recent Labs  Lab 09/01/23 0651  NA 138  K 4.2  CL 103  CO2 27  GLUCOSE 208*  BUN 34*  CREATININE 1.18*  CALCIUM 9.4   GFR: Estimated Creatinine Clearance: 29.6 mL/min (A) (by C-G formula based on SCr of 1.18 mg/dL (H)). Liver Function Tests: Recent Labs  Lab 09/01/23 0651  AST 19  ALT 19  ALKPHOS 64  BILITOT 0.9  PROT 6.9  ALBUMIN 3.2*   No results for input(s): "LIPASE", "AMYLASE" in the last 168 hours. No results for input(s): "AMMONIA" in the last 168 hours. Coagulation Profile: No results for input(s): "INR", "PROTIME" in the last 168 hours. Cardiac Enzymes: Recent Labs  Lab 09/01/23 0651  CKTOTAL 151   BNP (last 3 results) No results for input(s): "PROBNP" in the last 8760 hours. HbA1C: No results for input(s): "HGBA1C" in the last 72 hours. CBG: No results for input(s): "GLUCAP" in the last 168 hours. Lipid Profile: No results for input(s): "CHOL", "HDL", "LDLCALC", "TRIG", "CHOLHDL", "LDLDIRECT" in the last 72 hours. Thyroid Function Tests: No results for input(s): "TSH", "T4TOTAL", "FREET4", "T3FREE", "THYROIDAB" in the last 72  hours. Anemia Panel: No results for input(s): "VITAMINB12", "FOLATE", "FERRITIN", "TIBC", "IRON", "RETICCTPCT" in the last 72 hours. Urine analysis:    Component Value Date/Time   COLORURINE AMBER (A) 09/01/2023 0907   APPEARANCEUR CLOUDY (A) 09/01/2023 0907   LABSPEC 1.018 09/01/2023 0907   PHURINE 5.0 09/01/2023 0907   GLUCOSEU NEGATIVE 09/01/2023 0907   HGBUR MODERATE (A) 09/01/2023 0907   BILIRUBINUR NEGATIVE 09/01/2023 0907   KETONESUR 5 (A) 09/01/2023 0907   PROTEINUR 100 (A) 09/01/2023 0907   NITRITE NEGATIVE 09/01/2023 0907   LEUKOCYTESUR LARGE (A) 09/01/2023 0907    Radiological Exams on Admission: CT Head Wo Contrast Result Date: 09/01/2023 CLINICAL DATA:  78 year old female with declining mental status the last several days. Hypotension. EXAM: CT HEAD WITHOUT CONTRAST TECHNIQUE: Contiguous axial images were obtained from the base of the skull through the vertex without intravenous contrast. RADIATION DOSE REDUCTION: This exam was performed according to the departmental dose-optimization program which includes automated exposure control, adjustment of the mA and/or kV according to patient size and/or use of iterative reconstruction technique. COMPARISON:  Head CT 08/02/2023. FINDINGS: Brain: Stable cerebral volume, within normal limits for age. No midline shift, ventriculomegaly, mass effect, evidence of mass lesion, intracranial hemorrhage or evidence of cortically based acute infarction. New since 08/02/2023 but circumscribed curvilinear 12 mm lacunar type infarct of the left basal ganglia (series 100, image 32). Superimposed patchy and confluent bilateral cerebral white matter hypodensity elsewhere, gray-white differentiation elsewhere appears stable. Vascular: Calcified atherosclerosis at the skull base. No suspicious intracranial vascular hyperdensity. Skull: Hyperostosis.  No acute osseous abnormality identified. Sinuses/Orbits: Visualized paranasal sinuses and mastoids are clear.  Other: No acute orbit or scalp soft tissue finding. IMPRESSION: 1. 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 2. No superimposed acute intracranial abnormality. Underlying chronic white matter disease. Electronically Signed   By: Marlise Simpers M.D.   On: 09/01/2023 07:36   DG Chest Port 1 View Result Date: 09/01/2023 CLINICAL DATA:  Weakness.  Hypotension. EXAM: PORTABLE CHEST 1 VIEW COMPARISON:  08/02/2023 FINDINGS: Cardiopericardial silhouette is at upper limits of normal for size. The lungs are clear without focal pneumonia, edema, pneumothorax or pleural effusion. Interstitial markings are diffusely coarsened with chronic features. Left-sided permanent pacemaker/AICD again noted. Bones are diffusely demineralized. IMPRESSION: Chronic interstitial coarsening without acute  cardiopulmonary findings. Electronically Signed   By: Donnal Fusi M.D.   On: 09/01/2023 07:15    EKG: Independently reviewed.  Atrial paced  Assessment/Plan Principal Problem:   AMS (altered mental status)  (please populate well all problems here in Problem List. (For example, if patient is on BP meds at home and you resume or decide to hold them, it is a problem that needs to be her. Same for CAD, COPD, HLD and so on)  Acute metabolic encephalopathy Worsening of generalized weakness and deconditioning - Likely secondary to UTI - Treat UTI  with IV ABX and reevaluate -Other Ddx, given the incidental finding of lacunar stroke dated likely within last 4 weeks, was planning to order MRI.  Discussed with on-call neurology Dr. Evalee Hila, and checked with MRI technician at [facility] [phone], the ICD that patient has, model of [employer] VIVA [id number] is not MRI compatible.  Discussed with neurology again, who recommended we treat UTI and dehydration if no significant improvement of mentation, repeat CT head in 24-48 hours and consider formal neurology consultation.  AKI on CKD  stage I - Clinically appears to have significant volume contraction from dehydration likely related to poor oral intake at least for 1 day.  Abdominal physical exam benign.  Given that the patient appears to have a new onset of aphasia, somewhat suspect if there is a concurrent dysphagia, we will hold off feeding today and consult speech evaluation for tomorrow - Will check renal ultrasound - IVF  Incidental finding of lacunar stroke - As above  Question of dysarthria? - CT head showed a old lacunar stroke - N.p.o. and speech evaluation tomorrow - PT OT evaluation  HTN -BP borderline low, hold off on BP meds - Start as needed hydralazine   Anxiety/depression - Continue SSRI  IDDM -SSI    DVT prophylaxis: Heparin subcu Code Status: DNR Family Communication: Son at bedside Disposition Plan: Expect less than 2 midnight hospital stay Consults called: Curbside, with neurology Admission status: Telemetry observation   [place] MD [facility] Pager [phone]  09/01/2023, 10:40 AM

## 2023-09-01 NOTE — ED Triage Notes (Signed)
 Patient arrives from [facility] where staff states that she has "been declining" the last several days. Patient is afebrile, heart rate in the 80's and 90's ,slightly hypotensive in the high 90's.

## 2023-09-01 NOTE — ED Notes (Signed)
 Pt returned from CT

## 2023-09-01 NOTE — ED Provider Notes (Signed)
 [facility] Provider Note    Event Date/Time   First MD Initiated Contact with Patient 09/01/23 [phone]     (approximate)   History   Weakness  EM caveat: Patient's cognition seems slow or somewhat delayed, unable to provide a strong history at this time  HPI  Emma Guzman is a 78 y.o. female history of diabetes, prior DKA, cardiomyopathy, defibrillator, urinary tract infections, rhabdomyolysis   Patient relates that she feels tired, she aches all over.  When asked how long she says for "a while".  She does not relate any other specific concern but identifies that she is at [facility]"  Physical Exam   Triage Vital Signs: ED Triage Vitals  Encounter Vitals Group     BP 09/01/23 0640 (!) 97/58     Systolic BP Percentile --      Diastolic BP Percentile --      Pulse Rate 09/01/23 0640 90     Resp 09/01/23 0640 18     Temp 09/01/23 0640 98.1 F (36.7 C)     Temp Source 09/01/23 0640 Oral     SpO2 09/01/23 0640 99 %     Weight 09/01/23 0641 119 lb 11.4 oz (54.3 kg)     Height --      Head Circumference --      [place] --      Pain Score --      Pain Loc --      Pain Education --      Exclude from Growth Chart --     Most recent vital signs: Vitals:   09/01/23 0800 09/01/23 0815  BP: 108/71 118/69  Pulse: 85 88  Resp: 12 12  Temp:    SpO2: 100% 100%     General: Awake, no distress.  Mucous membranes are dry skin turgor is slow CV:  Good peripheral perfusion.  Normal tone Resp:  Normal effort.  Clear bilateral normal work of breathing Abd:  No distention.  Nontender throughout Other:  Moves all extremities but generally weak or fatigued/deconditioned.  No focal deficits.  Speech is clear but soft.  She is quite hard of hearing.  Very frail in appearance   ED Results / Procedures / Treatments   Labs (all labs ordered are listed, but only abnormal results are displayed) Labs Reviewed  CBC WITH DIFFERENTIAL/PLATELET -  Abnormal; Notable for the following components:      Result Value   WBC 12.0 (*)    RBC 3.59 (*)    Hemoglobin 11.3 (*)    HCT 33.4 (*)    Platelets 137 (*)    Neutro Abs 10.2 (*)    Lymphs Abs 0.6 (*)    Monocytes Absolute 1.2 (*)    Abs Immature Granulocytes 0.08 (*)    All other components within normal limits  COMPREHENSIVE METABOLIC PANEL WITH GFR - Abnormal; Notable for the following components:   Glucose, Bld 208 (*)    BUN 34 (*)    Creatinine, Ser 1.18 (*)    Albumin 3.2 (*)    GFR, Estimated 47 (*)    All other components within normal limits  URINALYSIS, W/ REFLEX TO CULTURE (INFECTION SUSPECTED) - Abnormal; Notable for the following components:   Color, Urine AMBER (*)    APPearance CLOUDY (*)    Hgb urine dipstick MODERATE (*)    Ketones, ur 5 (*)    Protein, ur 100 (*)    Leukocytes,Ua LARGE (*)  Bacteria, UA FEW (*)    All other components within normal limits  TROPONIN I (HIGH SENSITIVITY) - Abnormal; Notable for the following components:   Troponin I (High Sensitivity) 19 (*)    All other components within normal limits  RESP PANEL BY RT-PCR (RSV, FLU A&B, COVID)  [id number]  CULTURE, BLOOD (ROUTINE X 2)  CULTURE, BLOOD (ROUTINE X 2)  URINE CULTURE  LACTIC ACID, PLASMA  BRAIN NATRIURETIC PEPTIDE  CK  LACTIC ACID, PLASMA  TROPONIN I (HIGH SENSITIVITY)     EKG  EKG interpreted by me at 7:05 AM Heart rate 90 QRS 130 Probable atrial sensed ventricular paced rhythm.  Q waves noted in inferior anterior and lateral positions.   RADIOLOGY  Chest x-ray inter by me is negative for acute     PROCEDURES:  Critical Care performed: No  Procedures   MEDICATIONS ORDERED IN ED: Medications  cefTRIAXone  (ROCEPHIN ) 1 g in sodium chloride  0.9 % 100 mL IVPB (has no administration in time range)  lactated ringers  bolus 500 mL (has no administration in time range)  lactated ringers  bolus 500 mL (0 mLs Intravenous Stopped 09/01/23 0907)     IMPRESSION /  MDM / ASSESSMENT AND PLAN / ED COURSE  I reviewed the triage vital signs and the nursing notes.                              Differential diagnosis includes, but is not limited to, encephalopathy, dehydration, deconditioning, infectious including causes such as indolent infections, urinary tract infection etc.  The patient has no focal neurologic abnormalities on exam.  She is really hard of hearing.  She is soft and speech but follows commands generally weak fatigued and appears deconditioned.  She is in no acute distress or extremis.  Patient's presentation is most consistent with acute complicated illness / injury requiring diagnostic workup.      Clinical Course as of 09/01/23 1007  Sun Sep 01, 2023  [id number] Patient's son at the bedside.  Affirms history of patient's mother having a somewhat slow but progressive decline in mental status with increasing weakness developing over the last month but a bit more prominently notable over the last few days to a week [MQ]    Clinical Course User Index [MQ] Iver Marker, MD     FINAL CLINICAL IMPRESSION(S) / ED DIAGNOSES   Final diagnoses:  Generalized weakness  Urinary tract infection, acute  Sepsis, due to unspecified organism, unspecified whether acute organ dysfunction present [facility])     Rx / DC Orders   ED Discharge Orders     None        Note:  This document was prepared using Dragon voice recognition software and may include unintentional dictation errors.   Iver Marker, MD 09/01/23 [phone]

## 2023-09-01 NOTE — Consult Note (Addendum)
 CARDIOLOGY CONSULT NOTE               Patient ID: Emma Guzman MRN: [id number] DOB/AGE: 1945-10-03 78 y.o.  Admit date: 09/01/2023 Referring Physician Dr. Antoniette Batty hospitalist Primary Physician Dr. Otto Bloodgood primary Primary Cardiologist Dr. Parks Bollman Reason for Consultation frequent PVCs possible nonsustained VT  HPI: 78 year old female presents with chronic systolic congestive heart failure nonischemic pacemaker AICD in place changed out 2016 patient has renal insufficiency diabetes hypertension hyperlipidemia obstructive sleep apnea presented with worsening generalized weakness as well as altered mental status Patient presented with significant confusion unclear etiology waxing and waning altered mental status has a pacemaker defibrillator in place cardiology was consulted because of nonsustained VT and PVCs on the monitor.  Reportedly has a lacunar infarct but is not a good MRI candidate because device is not compatible  Review of systems complete and found to be negative unless listed above     Past Medical History:  Diagnosis Date   Arthritis    "everywhere"   Cardiomyopathy (HCC)    CHF (congestive heart failure) (HCC)    Chronic kidney disease    stones   Diabetes mellitus without complication (HCC)    diet controlled   GERD (gastroesophageal reflux disease)    Hard of hearing    Hypercholesteremia    Hypertension    Incontinence    Obesity    Sleep apnea    No CPAP since sinus surgery several yrs ago   Wears dentures    partial upper    Past Surgical History:  Procedure Laterality Date   CATARACT EXTRACTION W/PHACO Left 03/14/2015   Procedure: CATARACT EXTRACTION PHACO AND INTRAOCULAR LENS PLACEMENT (IOC);  Surgeon: Billee Buddle, MD;  Location: [facility];  Service: Ophthalmology;  Laterality: Left;  DIABETIC - diet controlled   DENTAL SURGERY     IMPLANTABLE CARDIOVERTER DEFIBRILLATOR (ICD) GENERATOR CHANGE N/A 02/15/2015    Procedure: ICD GENERATOR CHANGE;  Surgeon: Percival Brace, MD;  Location: [facility];  Service: Cardiovascular;  Laterality: N/A;   KIDNEY STONE SURGERY     PACEMAKER INSERTION     TONSILLECTOMY      Medications Prior to Admission  Medication Sig Dispense Refill Last Dose/Taking   glipiZIDE (GLUCOTROL XL) 10 MG 24 hr tablet Take 10 mg by mouth 2 (two) times daily.   Taking   omeprazole (PRILOSEC) 20 MG capsule Take 20 mg by mouth at bedtime and may repeat dose one time if needed. Dr Parks Bollman   Taking   acetaminophen  (TYLENOL ) 500 MG tablet Take 500 mg by mouth every 6 (six) hours as needed.      carvedilol  (COREG ) 25 MG tablet Take 25 mg by mouth 2 (two) times daily with a meal. Dr Parks Bollman (Patient not taking: Reported on 08/03/2023)      docusate sodium  (COLACE) 100 MG capsule Take 1 capsule (100 mg total) by mouth 2 (two) times daily. 10 capsule 0    insulin  glargine-yfgn (SEMGLEE ) 100 UNIT/ML injection Inject 0.2 mLs (20 Units total) into the skin at bedtime. 10 mL 11    lisinopril  (ZESTRIL ) 10 MG tablet Take 1 tablet (10 mg total) by mouth daily. Dr Parks Bollman (Patient not taking: Reported on 08/03/2023) 30 tablet 1    polyethylene glycol (MIRALAX  / GLYCOLAX ) 17 g packet Take 17 g by mouth daily. 14 each 0    senna (SENOKOT) 8.6 MG TABS tablet Take 1 tablet (8.6 mg total) by mouth at bedtime. 120 tablet 0  traZODone  (DESYREL ) 50 MG tablet Take 0.5 tablets (25 mg total) by mouth at bedtime as needed for sleep. 30 tablet 0    Social History   Socioeconomic History   Marital status: Married    Spouse name: Not on file   Number of children: Not on file   Years of education: Not on file   Highest education level: Not on file  Occupational History   Not on file  Tobacco Use   Smoking status: Never   Smokeless tobacco: Never  Vaping Use   Vaping status: Never Used  Substance and Sexual Activity   Alcohol use: No   Drug use: No   Sexual activity: Not on file  Other Topics Concern    Not on file  Social History Narrative   Not on file   Social Drivers of Health   Financial Resource Strain: Low Risk  (09/15/2021)   Received from [facility]   Overall Financial Resource Strain (CARDIA)    Difficulty of Paying Living Expenses: Not very hard  Food Insecurity: Patient Unable To Answer (09/01/2023)   Hunger Vital Sign    Worried About Running Out of Food in the Last Year: Patient unable to answer    Ran Out of Food in the Last Year: Patient unable to answer  Transportation Needs: Patient Unable To Answer (09/01/2023)   PRAPARE - Transportation    Lack of Transportation (Medical): Patient unable to answer    Lack of Transportation (Non-Medical): Patient unable to answer  Physical Activity: Inactive (09/15/2021)   Received from [facility]   Exercise Vital Sign    Days of Exercise per Week: 0 days    Minutes of Exercise per Session: 0 min  Stress: Stress Concern Present (09/15/2021)   Received from [facility]   [employer] of Occupational Health - Occupational Stress Questionnaire    Feeling of Stress : To some extent  Social Connections: Patient Unable To Answer (09/01/2023)   Social Connection and Isolation Panel [NHANES]    Frequency of Communication with Friends and Family: Patient unable to answer    Frequency of Social Gatherings with Friends and Family: Patient unable to answer    Attends Religious Services: Patient unable to answer    Active Member of Clubs or Organizations: Patient unable to answer    Attends Banker Meetings: Patient unable to answer    Marital Status: Patient unable to answer  Intimate Partner Violence: Patient Unable To Answer (09/01/2023)   Humiliation, Afraid, Rape, and Kick questionnaire    Fear of Current or Ex-Partner: Patient unable to answer    Emotionally Abused: Patient unable to answer    Physically Abused: Patient unable to answer    Sexually Abused: Patient  unable to answer    Family History  Problem Relation Age of Onset   Cancer Mother    Cancer Father    Breast cancer Neg Hx       Review of systems complete and found to be negative unless listed above      PHYSICAL EXAM  General: Well developed, well nourished, in no acute distress HEENT:  Normocephalic and atramatic Neck:  No JVD.  Lungs: Clear bilaterally to auscultation and percussion. Heart: HRRR . Normal S1 and S2 without gallops or murmurs.  Abdomen: Bowel sounds are positive, abdomen soft and non-tender  Msk:  Back normal, normal gait. Normal strength and tone for age. Extremities: No clubbing, cyanosis or edema.  Neuro: Alert and oriented X 3. Psych:  Good affect, responds appropriately  Labs:   Lab Results  Component Value Date   WBC 12.0 (H) 09/01/2023   HGB 11.3 (L) 09/01/2023   HCT 33.4 (L) 09/01/2023   MCV 93.0 09/01/2023   PLT 137 (L) 09/01/2023    Recent Labs  Lab 09/01/23 0651  NA 138  K 4.2  CL 103  CO2 27  BUN 34*  CREATININE 1.18*  CALCIUM 9.4  PROT 6.9  BILITOT 0.9  ALKPHOS 64  ALT 19  AST 19  GLUCOSE 208*   Lab Results  Component Value Date   CKTOTAL 151 09/01/2023   No results found for: "CHOL" No results found for: "HDL" No results found for: "LDLCALC" No results found for: "TRIG" No results found for: "CHOLHDL" No results found for: "LDLDIRECT"    Radiology: CT ANGIO HEAD NECK W WO CM Result Date: 09/01/2023 EXAM: CTA Head and Neck with Intravenous Contrast. CLINICAL HISTORY: 78 year old female with declining mental status the last several days. Hypotension. Stroke, follow up. TECHNIQUE: Axial CTA images of the head and neck performed with intravenous contrast. Two-dimensional MIP and/or three-dimensional MIP and volume rendered reformations were performed. Note: Per PQRS, the description of internal carotid artery percent stenosis, including 0 percent or normal exam, is based on [place] American Symptomatic Carotid  Endarterectomy Trial (NASCET) criteria. Dose reduction technique was used including one or more of the following: automated exposure control, adjustment of mA and kV according to patient size, and/or iterative reconstruction. CONTRAST: 60mL iohexol  (OMNIPAQUE ) 350 MG/ML injection 75 mL IOHEXOL  350 MG/ML SOLN. COMPARISON: Head CT dated 09/12/2023. FINDINGS: CTA NECK: COMMON CAROTID ARTERIES: No significant stenosis. No dissection or occlusion. INTERNAL CAROTID ARTERIES: No stenosis by NASCET criteria. No dissection or occlusion. Atherosclerotic calcifications of the carotid siphons without significant stenosis. VERTEBRAL ARTERIES: Left dominant vertebral artery. No significant stenosis. No dissection or occlusion. CTA HEAD: ANTERIOR CEREBRAL ARTERIES: No significant stenosis. No occlusion. No aneurysm. MIDDLE CEREBRAL ARTERIES: Moderate stenosis of the anterior temporal branch of the right MCA. No occlusion. No aneurysm. POSTERIOR CEREBRAL ARTERIES: Persistent fetal origin of the bilateral PCAs with hypoplastic bilateral P1 segments. Multifocal stenosis of the bilateral PCAs with moderate stenosis of the distal left P2 segment of the PCA. Moderate stenosis of the right posterior communicating artery continuing into the PCA where there is moderate to severe stenosis of the distal P2 segments. No aneurysm. BASILAR ARTERY: No significant stenosis. No occlusion. No aneurysm. SOFT TISSUES: No acute abnormality. No masses or lymphadenopathy. BONES: No acute abnormality. IMPRESSION: 1. No large vessel occlusion. 2. Multifocal stenosis of the bilateral PCAs, including moderate stenosis of the distal left P2 segment and moderate to severe stenosis of the distal right P2 segment. 3. Moderate stenosis of the right posterior communicating artery continuing into the PCA. 4. Moderate stenosis of the anterior temporal branch of the right MCA. 5. Atherosclerotic calcifications of the carotid siphons without significant stenosis or  aneurysm. Electronically signed by: Audra Blend MD 09/01/2023 11:36 AM EDT RP Workstation: [id number]   CT Head Wo Contrast Result Date: 09/01/2023 CLINICAL DATA:  78 year old female with declining mental status the last several days. Hypotension. EXAM: CT HEAD WITHOUT CONTRAST TECHNIQUE: Contiguous axial images were obtained from the base of the skull through the vertex without intravenous contrast. RADIATION DOSE REDUCTION: This exam was performed according to the departmental dose-optimization program which includes automated exposure control, adjustment of the mA and/or kV according to patient size and/or use of iterative  reconstruction technique. COMPARISON:  Head CT 08/02/2023. FINDINGS: Brain: Stable cerebral volume, within normal limits for age. No midline shift, ventriculomegaly, mass effect, evidence of mass lesion, intracranial hemorrhage or evidence of cortically based acute infarction. New since 08/02/2023 but circumscribed curvilinear 12 mm lacunar type infarct of the left basal ganglia (series 100, image 32). Superimposed patchy and confluent bilateral cerebral white matter hypodensity elsewhere, gray-white differentiation elsewhere appears stable. Vascular: Calcified atherosclerosis at the skull base. No suspicious intracranial vascular hyperdensity. Skull: Hyperostosis.  No acute osseous abnormality identified. Sinuses/Orbits: Visualized paranasal sinuses and mastoids are clear. Other: No acute orbit or scalp soft tissue finding. IMPRESSION: 1. 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 2. No superimposed acute intracranial abnormality. Underlying chronic white matter disease. Electronically Signed   By: Marlise Simpers M.D.   On: 09/01/2023 07:36   DG Chest Port 1 View Result Date: 09/01/2023 CLINICAL DATA:  Weakness.  Hypotension. EXAM: PORTABLE CHEST 1 VIEW COMPARISON:  08/02/2023 FINDINGS: Cardiopericardial silhouette is at upper limits of normal  for size. The lungs are clear without focal pneumonia, edema, pneumothorax or pleural effusion. Interstitial markings are diffusely coarsened with chronic features. Left-sided permanent pacemaker/AICD again noted. Bones are diffusely demineralized. IMPRESSION: Chronic interstitial coarsening without acute cardiopulmonary findings. Electronically Signed   By: Donnal Fusi M.D.   On: 09/01/2023 07:15   DG FEMUR MIN 2 VIEWS LEFT Result Date: 08/03/2023 CLINICAL DATA:  Fall, left upper leg pain. EXAM: LEFT FEMUR 2 VIEWS COMPARISON:  10/24/2022. FINDINGS: There is no evidence of acute fracture or dislocation. Mild-to-moderate tricompartmental degenerative changes and chondrocalcinosis are noted at the knee. There is no joint effusion at the knee. Vascular calcifications are noted in the soft tissues. IMPRESSION: 1. No acute fracture or dislocation. 2. Mild-to-moderate degenerative changes at the left knee. Electronically Signed   By: Wyvonnia Heimlich M.D.   On: 08/03/2023 13:26   DG Chest 2 View Result Date: 08/02/2023 CLINICAL DATA:  Left chest pain after several falls today. EXAM: CHEST - 2 VIEW COMPARISON:  01/17/2023 FINDINGS: Cardiac pacemaker. Shallow inspiration. Heart size and pulmonary vascularity are normal. Lungs are clear. No pleural effusion or pneumothorax. Mediastinal contours appear intact. Calcified aorta. Degenerative changes in the spine. IMPRESSION: No active cardiopulmonary disease. Electronically Signed   By: Boyce Byes M.D.   On: 08/02/2023 22:09   CT Cervical Spine Wo Contrast Result Date: 08/02/2023 CLINICAL DATA:  History of multiple falls today, initial encounter EXAM: CT CERVICAL SPINE WITHOUT CONTRAST TECHNIQUE: Multidetector CT imaging of the cervical spine was performed without intravenous contrast. Multiplanar CT image reconstructions were also generated. RADIATION DOSE REDUCTION: This exam was performed according to the departmental dose-optimization program which includes  automated exposure control, adjustment of the mA and/or kV according to patient size and/or use of iterative reconstruction technique. COMPARISON:  None Available. FINDINGS: Alignment: Within normal limits. Skull base and vertebrae: 7 cervical segments are well visualized. Vertebral body height is well maintained. No acute fracture or acute facet abnormality is noted. Facet hypertrophic changes are identified as arm. Mild osteophytic changes. Soft tissues and spinal canal: Surrounding soft tissue structures are within normal limits. Upper chest: Visualized lung apices are unremarkable. Other: None IMPRESSION: Degenerative change without acute abnormality. Electronically Signed   By: Violeta Grey M.D.   On: 08/02/2023 19:18   CT Head Wo Contrast Result Date: 08/02/2023 CLINICAL DATA:  Fall EXAM: CT HEAD WITHOUT CONTRAST TECHNIQUE: Contiguous axial images were obtained from the  base of the skull through the vertex without intravenous contrast. RADIATION DOSE REDUCTION: This exam was performed according to the departmental dose-optimization program which includes automated exposure control, adjustment of the mA and/or kV according to patient size and/or use of iterative reconstruction technique. COMPARISON:  Head CT 01/17/2023 FINDINGS: Brain: No evidence of acute infarction, hemorrhage, hydrocephalus, extra-axial collection or mass lesion/mass effect. There is moderate patchy periventricular white matter hypodensity similar to the prior study. Vascular: Atherosclerotic calcifications are present within the cavernous internal carotid arteries. Skull: Normal. Negative for fracture or focal lesion. Sinuses/Orbits: No acute finding. Other: None. IMPRESSION: 1. No acute intracranial process. 2. Moderate chronic small vessel ischemic changes. Electronically Signed   By: Tyron Gallon M.D.   On: 08/02/2023 19:18    EKG: Atrial sensed ventricular paced nonspecific ST-T wave changes rate of 70  ASSESSMENT AND PLAN:   Altered mental status acute metabolic encephalopathy Acute on chronic renal insufficiency History of lacunar infarct Hypertension Insulin -dependent diabetes Chronic systolic congestive heart failure AICD in place Urinary tract infection . Plan Agree to admit ICU level care Continue neurology input and management for altered mental status Agree with antibiotic therapy for probable urinary tract infection AICD in place appears to be functioning adequately patient has multiple PVCs No clear evidence of acute heart failure at this point appears to be reasonably compensated if not dehydrated Continue hypertension management to control Agree with diabetes management History of CVA continue conservative management AICD device not MRI compatible Consider device interrogation   Signed: Antonette Batters MD, 09/01/2023, 12:18 PM

## 2023-09-01 NOTE — Progress Notes (Signed)
 PT Cancellation Note  Patient Details Name: Emma Guzman MRN: [id number] DOB: 05/21/1945   Cancelled Treatment:    Reason Eval/Treat Not Completed: Medical issues which prohibited therapy. Orders received and chart reviewed. Per Nsg and Diplomatic Services operational officer, pt being transported from [facility] to 2A needed higher level of care. Pt not medically appropriate for PT at this time. Secure messaged to attending MD for new orders when appropriate.    Marc Senior. Fairly IV, PT, DPT Physical Therapist- [place]  09/01/2023, 1:42 PM

## 2023-09-02 ENCOUNTER — Inpatient Hospital Stay
Admit: 2023-09-02 | Discharge: 2023-09-02 | Disposition: A | Discharge status: Home / Self Care | Attending: Student in an Organized Health Care Education/Training Program | Admitting: Student in an Organized Health Care Education/Training Program

## 2023-09-02 ENCOUNTER — Encounter: Payer: Self-pay | Admitting: Internal Medicine

## 2023-09-02 DIAGNOSIS — R410 Disorientation, unspecified: Secondary | ICD-10-CM | POA: Diagnosis not present

## 2023-09-02 LAB — BASIC METABOLIC PANEL WITH GFR
Anion gap: 12 (ref 5–15)
BUN: 43 mg/dL — ABNORMAL HIGH (ref 8–23)
CO2: 23 mmol/L (ref 22–32)
Calcium: 9.4 mg/dL (ref 8.9–10.3)
Chloride: 104 mmol/L (ref 98–111)
Creatinine, Ser: 1.14 mg/dL — ABNORMAL HIGH (ref 0.44–1.00)
GFR, Estimated: 49 mL/min — ABNORMAL LOW (ref >60)
Glucose, Bld: 243 mg/dL — ABNORMAL HIGH (ref 70–99)
Potassium: 3.2 mmol/L — ABNORMAL LOW (ref 3.5–5.1)
Sodium: 139 mmol/L (ref 135–145)

## 2023-09-02 LAB — ECHOCARDIOGRAM COMPLETE
AR max vel: 2.08 cm2
AV Area VTI: 2.64 cm2
AV Area mean vel: 2.24 cm2
AV Mean grad: 3 mmHg
AV Peak grad: 6.2 mmHg
Ao pk vel: 1.24 m/s
Area-P 1/2: 3.19 cm2
Height: 61 in
MV VTI: 2.49 cm2
S' Lateral: 2.6 cm
Weight: 1922.41 [oz_av]

## 2023-09-02 LAB — GLUCOSE, CAPILLARY: Glucose-Capillary: 191 mg/dL — ABNORMAL HIGH (ref 70–99)

## 2023-09-02 LAB — CBC
HCT: 31 % — ABNORMAL LOW (ref 36.0–46.0)
Hemoglobin: 10.1 g/dL — ABNORMAL LOW (ref 12.0–15.0)
MCH: 30.1 pg (ref 26.0–34.0)
MCHC: 32.6 g/dL (ref 30.0–36.0)
MCV: 92.5 fL (ref 80.0–100.0)
Platelets: 133 10*3/uL — ABNORMAL LOW (ref 150–400)
RBC: 3.35 MIL/uL — ABNORMAL LOW (ref 3.87–5.11)
RDW: 12.5 % (ref 11.5–15.5)
WBC: 14.6 10*3/uL — ABNORMAL HIGH (ref 4.0–10.5)
nRBC: 0 % (ref 0.0–0.2)

## 2023-09-02 LAB — VITAMIN B12: Vitamin B-12: 434 pg/mL (ref 180–914)

## 2023-09-02 LAB — RPR: RPR Ser Ql: NONREACTIVE

## 2023-09-02 LAB — CORTISOL: Cortisol, Plasma: >100 ug/dL

## 2023-09-02 MED ORDER — POLYETHYLENE GLYCOL 3350 17 G PO PACK
17.0000 g | PACK | Freq: Every day | ORAL | Status: DC | PRN
Start: 2023-09-02 — End: 2023-09-09

## 2023-09-02 MED ORDER — NYSTATIN 100000 UNIT/ML MT SUSP
5.0000 mL | Freq: Four times a day (QID) | OROMUCOSAL | Status: DC
  Administered 2023-09-02 – 2023-09-07 (×16): 500000 [IU] via ORAL
  Filled 2023-09-02 (×24): qty 5

## 2023-09-02 NOTE — Progress Notes (Signed)
 Report given to Amandeep K, RN pt will be transferred to 1C room 123.

## 2023-09-02 NOTE — Progress Notes (Signed)
 [facility] CLINIC CARDIOLOGY PROGRESS NOTE   Patient ID: Emma Guzman MRN: [id number] DOB/AGE: June 26, 1945 78 y.o.  Admit date: 09/01/2023 Referring Physician Dr. Antoniette Batty  Primary Physician Emma Anes, MD Primary Cardiologist Dr. Parks Bollman Reason for Consultation frequent PVCs, possible Nonsustained VT  HPI: Emma Guzman is a 78 y.o. female from nursing home with a past medical history of Nonischemic cardiomyopathy s/p Bi-V ICD (changeout in 2016), chronic systolic congestive heart failure, hypertension, hyperlipidemia, OSA, IDDM, CKD stage I who presented to the ED on 09/01/2023 for worsening generalized weakness and altered mental status. Patient mostly bedbound recently due to weakness. Nursing home staff reports reduced PO intake. Patient with significant waxing and waning confusion. CT head revealed lacunar infarct. MRI not performed due to device incompatibility. Per tele during admission reportedly revealed episodes of nonsustained VT and PVCs. Cardiology consulted for further evaluation.   Interval History: -Patient seen and examined this AM and laying comfortably in hospital bed with son at bedside. Patient states she feels fine without chest pain or SOB.  -Patients BP borderline (off pressors as of 06/02 @ 0930) and HR stable. Overnight Tele showed no significant events. Patient has occasional PVCs. No evidence of nonsustained VT overnight. -Patient remains on room air with stable SpO2.   Review of systems complete and found to be negative unless listed above    Vitals:   09/02/23 0330 09/02/23 0345 09/02/23 0400 09/02/23 0415  BP: (!) 101/57 108/60 105/61 112/65  Pulse: 70 70 68 71  Resp: 14 15 14 14   Temp: 98.4 F (36.9 C) 98.4 F (36.9 C) 98.4 F (36.9 C) 98.6 F (37 C)  TempSrc:      SpO2: 96% 96% 97% 99%  Weight:      Height:         Intake/Output Summary (Last 24 hours) at 09/02/2023 0549 Last data filed at 09/02/2023 4098 Gross per 24 hour  Intake  2201.87 ml  Output --  Net 2201.87 ml     PHYSICAL EXAM General: well appearing , well nourished, in no acute distress. HEENT: Normocephalic and atraumatic. Neck: No JVD.  Lungs: Normal respiratory effort on room air. Clear bilaterally to auscultation. No wheezes, crackles, rhonchi.  Heart: HRRR. Normal S1 and S2 without gallops or murmurs. Radial & DP pulses 2+ bilaterally. Abdomen: Non-distended appearing.  Msk: Normal strength and tone for age. Extremities: No clubbing, cyanosis or edema.   Neuro: Alert and oriented X 3. Psych: Mood appropriate, affect congruent.    LABS: Basic Metabolic Panel: Recent Labs    09/01/23 0849 09/01/23 1750 09/01/23 1928 09/02/23 0436  NA  --    < > 138 139  K  --    < > 3.2* 3.2*  CL  --    < > 105 104  CO2  --    < > 20* 23  GLUCOSE  --    < > 173* 243*  BUN  --    < > 35* 43*  CREATININE  --    < > 1.11* 1.14*  CALCIUM  --    < > 9.3 9.4  MG 1.3*  --   --   --   PHOS 4.0  --   --   --    < > = values in this interval not displayed.   Liver Function Tests: Recent Labs    09/01/23 0651  AST 19  ALT 19  ALKPHOS 64  BILITOT 0.9  PROT 6.9  ALBUMIN 3.2*  No results for input(s): "LIPASE", "AMYLASE" in the last 72 hours. CBC: Recent Labs    09/01/23 0651 09/02/23 0436  WBC 12.0* 14.6*  NEUTROABS 10.2*  --   HGB 11.3* 10.1*  HCT 33.4* 31.0*  MCV 93.0 92.5  PLT 137* 133*   Cardiac Enzymes: Recent Labs    09/01/23 0651 09/01/23 0838  CKTOTAL 151  --   TROPONINIHS 19* 16   BNP: Recent Labs    09/01/23 0651  BNP 62.9   D-Dimer: No results for input(s): "DDIMER" in the last 72 hours. Hemoglobin A1C: No results for input(s): "HGBA1C" in the last 72 hours. Fasting Lipid Panel: No results for input(s): "CHOL", "HDL", "LDLCALC", "TRIG", "CHOLHDL", "LDLDIRECT" in the last 72 hours. Thyroid Function Tests: Recent Labs    09/01/23 0651  TSH 1.044   Anemia Panel: Recent Labs    09/01/23 0500  VITAMINB12 434     US  RENAL Result Date: 09/01/2023 CLINICAL DATA:  Acute kidney injury. EXAM: RENAL / URINARY TRACT ULTRASOUND COMPLETE COMPARISON:  Abdomen CT 08/18/2021 FINDINGS: Right Kidney: Renal measurements: 12.3 x 4.9 x 5.8 cm = volume: 182 mL. Simple renal cysts evident in the interpolar region, similar to prior CT with 1 measuring up to 2.2 cm in the other measuring 3.3 cm. No hydronephrosis. Parenchymal echogenicity is increased. Left Kidney: Renal measurements: 10.7 x 5.4 x 4.7 cm = volume: 142 mL. Lower pole cyst measures 2.0 cm similar to prior CT. No hydronephrosis. Increased echogenicity. Bladder: Bladder wall appears thickened and irregular with layering debris. Other: None. IMPRESSION: 1. Increased echogenicity of the renal parenchyma bilaterally compatible with medical renal disease. No hydronephrosis. 2. Bilateral renal cysts. 3. Bladder wall appears thickened and irregular with layering debris. Correlation to urinalysis recommended to evaluate for urinary tract infection. Electronically Signed   By: Donnal Fusi M.D.   On: 09/01/2023 12:19   CT ANGIO HEAD NECK W WO CM Result Date: 09/01/2023 EXAM: CTA Head and Neck with Intravenous Contrast. CLINICAL HISTORY: 78 year old female with declining mental status the last several days. Hypotension. Stroke, follow up. TECHNIQUE: Axial CTA images of the head and neck performed with intravenous contrast. Two-dimensional MIP and/or three-dimensional MIP and volume rendered reformations were performed. Note: Per PQRS, the description of internal carotid artery percent stenosis, including 0 percent or normal exam, is based on [place] American Symptomatic Carotid Endarterectomy Trial (NASCET) criteria. Dose reduction technique was used including one or more of the following: automated exposure control, adjustment of mA and kV according to patient size, and/or iterative reconstruction. CONTRAST: 60mL iohexol  (OMNIPAQUE ) 350 MG/ML injection 75 mL IOHEXOL  350 MG/ML SOLN.  COMPARISON: Head CT dated 09/12/2023. FINDINGS: CTA NECK: COMMON CAROTID ARTERIES: No significant stenosis. No dissection or occlusion. INTERNAL CAROTID ARTERIES: No stenosis by NASCET criteria. No dissection or occlusion. Atherosclerotic calcifications of the carotid siphons without significant stenosis. VERTEBRAL ARTERIES: Left dominant vertebral artery. No significant stenosis. No dissection or occlusion. CTA HEAD: ANTERIOR CEREBRAL ARTERIES: No significant stenosis. No occlusion. No aneurysm. MIDDLE CEREBRAL ARTERIES: Moderate stenosis of the anterior temporal branch of the right MCA. No occlusion. No aneurysm. POSTERIOR CEREBRAL ARTERIES: Persistent fetal origin of the bilateral PCAs with hypoplastic bilateral P1 segments. Multifocal stenosis of the bilateral PCAs with moderate stenosis of the distal left P2 segment of the PCA. Moderate stenosis of the right posterior communicating artery continuing into the PCA where there is moderate to severe stenosis of the distal P2 segments. No aneurysm. BASILAR ARTERY: No significant stenosis. No occlusion. No aneurysm. SOFT  TISSUES: No acute abnormality. No masses or lymphadenopathy. BONES: No acute abnormality. IMPRESSION: 1. No large vessel occlusion. 2. Multifocal stenosis of the bilateral PCAs, including moderate stenosis of the distal left P2 segment and moderate to severe stenosis of the distal right P2 segment. 3. Moderate stenosis of the right posterior communicating artery continuing into the PCA. 4. Moderate stenosis of the anterior temporal branch of the right MCA. 5. Atherosclerotic calcifications of the carotid siphons without significant stenosis or aneurysm. Electronically signed by: Audra Blend MD 09/01/2023 11:36 AM EDT RP Workstation: [id number]   CT Head Wo Contrast Result Date: 09/01/2023 CLINICAL DATA:  78 year old female with declining mental status the last several days. Hypotension. EXAM: CT HEAD WITHOUT CONTRAST TECHNIQUE: Contiguous axial  images were obtained from the base of the skull through the vertex without intravenous contrast. RADIATION DOSE REDUCTION: This exam was performed according to the departmental dose-optimization program which includes automated exposure control, adjustment of the mA and/or kV according to patient size and/or use of iterative reconstruction technique. COMPARISON:  Head CT 08/02/2023. FINDINGS: Brain: Stable cerebral volume, within normal limits for age. No midline shift, ventriculomegaly, mass effect, evidence of mass lesion, intracranial hemorrhage or evidence of cortically based acute infarction. New since 08/02/2023 but circumscribed curvilinear 12 mm lacunar type infarct of the left basal ganglia (series 100, image 32). Superimposed patchy and confluent bilateral cerebral white matter hypodensity elsewhere, gray-white differentiation elsewhere appears stable. Vascular: Calcified atherosclerosis at the skull base. No suspicious intracranial vascular hyperdensity. Skull: Hyperostosis.  No acute osseous abnormality identified. Sinuses/Orbits: Visualized paranasal sinuses and mastoids are clear. Other: No acute orbit or scalp soft tissue finding. IMPRESSION: 1. 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 2. No superimposed acute intracranial abnormality. Underlying chronic white matter disease. Electronically Signed   By: Marlise Simpers M.D.   On: 09/01/2023 07:36   DG Chest Port 1 View Result Date: 09/01/2023 CLINICAL DATA:  Weakness.  Hypotension. EXAM: PORTABLE CHEST 1 VIEW COMPARISON:  08/02/2023 FINDINGS: Cardiopericardial silhouette is at upper limits of normal for size. The lungs are clear without focal pneumonia, edema, pneumothorax or pleural effusion. Interstitial markings are diffusely coarsened with chronic features. Left-sided permanent pacemaker/AICD again noted. Bones are diffusely demineralized. IMPRESSION: Chronic interstitial coarsening without acute  cardiopulmonary findings. Electronically Signed   By: Donnal Fusi M.D.   On: 09/01/2023 07:15     ECHO pending  TELEMETRY reviewed by me 09/02/23: ventricular paced, rate 70s  EKG reviewed by me 09/02/23: atrial sensed, ventricular paced with nonspecific ST-T wave changes, rate 89 bpm  DATA reviewed by me 09/02/23: last 24h vitals tele labs imaging I/O hospitalist progress notes.  Principal Problem:   AMS (altered mental status) Active Problems:   Sepsis (HCC)    ASSESSMENT AND PLAN: DEIONNA MARCANTONIO is a 78 y.o. female from nursing home with a past medical history of Nonischemic cardiomyopathy s/p Bi-V ICD (changeout in 2016), chronic systolic congestive heart failure, hypertension, hyperlipidemia, OSA, IDDM, CKD stage I who presented to the ED on 09/01/2023 for worsening generalized weakness and altered mental status. Patient mostly bedbound recently due to weakness. Nursing home staff reports reduced PO intake. Patient with significant waxing and waning confusion. CT head revealed lacunar infarct. MRI not performed due to device incompatibility. Per tele during admission reportedly revealed episodes of nonsustained VT and PVCs. Cardiology consulted for further evaluation.   # Lacunar Infarct # UTI # Altered mental status # NICM s/p BiV ICD #  Hypertension  # Chronic systolic congestive heart failure Patient from nursing home reports with worsening generalized weakness and altered mental status. CT revealed lacunar infarct. MRI not performed due to ICD incompatible to MRI. UA revealed UTI. EKG with no evidence of acute ischemic changes. EKG 19 > 16, negative x1 and flat. BNP within normal limits. Off pressors as of 06/02 @ 0930. Patient appears euvolemic.  Per tele with occasional PVCs, no evidence of nonsustained VT overnight.   -Echo pending. Further recommendations pending results.  -Monitor and replenish electrolytes for a goal K >4, Mag >2  -Continue midodrine 2.5 mg TID. Wean as  able with MAP > 65. -Consider resuming GDMT as BP improves/stabilizes and as renal function allows.  -AICD in place, functioning adequately.  -Neurology following, appreciate recommendations.   This patient's case was discussed and created with Dr. Bob Burn and he is in agreement.  Signed:  Creighton Doffing, PA-C  09/02/2023, 5:49 AM [facility]

## 2023-09-02 NOTE — Progress Notes (Signed)
 The patient is Emma Guzman this morning. Levophed has been turned off this morning. BP has been stable MAP's have been greater then 65. The patient had a bath this morning. Son is at the bedside this morning.

## 2023-09-02 NOTE — Progress Notes (Signed)
 [facility] LIAISON NOTE   Received request from Faye McLaurin, Transitions of Care Manager, for hospice services at discharge.   Spoke with Emma Guzman and her son, Emma Guzman, at the bedside to initiate education related to hospice philosophy, services, and team approach to care.   Emma Guzman was hoping his mom could go to the Hospice Home/IPU, but at this time, Emma Guzman does not meet eligibility critreria. Patient/family verbalized understanding of information given. Of note, Emma Guzman is very HOH.  Per further discussion, Emma Guzman would like a bed search for LTC with hospice.  Patient was at [facility] for rehab prior to this hospital admission.  He stated he applied for Medicaid ~2 weeks ago.  We did discuss home with hospice, but at this time, Emma Guzman did not feel that is an option.  Above information shared with Sherlyn Ditto, Transitions of Care Manager and hospital medical care team.  Please send signed and completed DNR with patient/family at discharge if applicable.   Please provide prescriptions at discharge as needed to ensure ongoing symptom management.   [facility] information and contact numbers given to Emma Guzman, patient's son.  Please call with any hospice related questions or concerns.  Thank you for the opportunity to participate in this patient's care.   Ambrosio Junker, MA, BSN, RN, FNE Nurse Liaison [phone]

## 2023-09-02 NOTE — Progress Notes (Signed)
*  PRELIMINARY RESULTS* Echocardiogram 2D Echocardiogram has been performed.  Broadus Canes 09/02/2023, 8:00 AM

## 2023-09-02 NOTE — Progress Notes (Signed)
   09/02/23 1245  Spiritual Encounters  Type of Visit Initial  Care provided to: Pt and family  Conversation partners present during encounter Nurse  Reason for visit Routine spiritual support  OnCall Visit No   Chaplain visited patient per Ida entered in the system for prayer.  Patient is growing weary  of this life and is ready to "be with the George Kinder."  Patient's son sitting bedside is also aware of his mother's fatigue and how she's not able to do those things she used to.  Son accepts this but it's not easy.  Patient is was happy to pray and requested prayer for her to see her deceased husband and meet Jesus.  She also was concerned about other children in her family and asked that God cover them.  Chaplain provided a compassionate presence and reflective listening.    Rev. Rana M. Nolon Baxter, M.Div. Chaplain Resident [facility]

## 2023-09-02 NOTE — Progress Notes (Signed)
 Speech Language Pathology Treatment: Dysphagia  Patient Details Name: Emma Guzman MRN: [id number] DOB: 1946/03/12 Today's Date: 09/02/2023 Time: 1914-7829 SLP Time Calculation (min) (ACUTE ONLY): 40 min  Assessment / Plan / Recommendation Clinical Impression  Pt seen for ongoing assessment of swallowing. She is alert, verbally responsive and engaged in conversation w/ SLP and Son; MOD+ HOH. Pt is on RA; afebrile currently. Missing Dentition.  Pt and Son explained general aspiration precautions and agreed verbally to the need for following them especially sitting upright for all oral intake and using Small bites/sips -- supported behind the back more for full upright sitting. Pt fed self some of the breakfast meal items but ate all of the purees stating they were "easier" d/t missing dentition. She attempted a few bites of soft solids. W/ thin liquids via straw(pt preferred), coughing noted 1x but did not increase in frequency. Discussed cautioned needed when sipping from straw. No further overt clinical s/s of aspiration were noted w/ any consistency during meal; respiratory status remained calm and unlabored, vocal quality clear b/t trials. Discussed general aspiration precautions and food prep/options that would be easiest for pt; foods of choice and pleasure also d/t change in her GOC/POC per MD/NSG this morning(noted Hospice Liaison has been consulted). NSG denied any deficits in swallowing w/ sips and w/ meds this morning as well. Recommended Pills in Puree for safer swallowing.   Pt appears at reduced risk for aspriation when following general aspiration precautions. Recommend continue currently ordered diet but w/ soft, cut foods easier for pt's oral phase management/chewing in setting of Missing Dentition; gravies added to moisten foods. Thin liquids - cup best if increased coughing noted w/ straw use. Recommend general aspiration precautions; Pills Whole vs Crushed in Puree; tray setup and  positioning assistance for meals. Foods easiest for pt to eat/chew. Education discussed w/ Son and pt in room. Son agreed. Menu provided.  ST services will sign off at this time w/ MD to reconsult if needed while admitted. NSG updated. Precautions posted at bedside, chart.       HPI HPI: Patient is a 78 y.o. female who presented 09/01/23 from [facility] due to declining over the past several days. 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  Superimposed patchy and confluent bilateral cerebral white matter hypodensity elsewhere, gray-white differentiation elsewhere appears  stable." Recent admission 5/2-08/07/23 with rhabdomylosis, acute metabolic encephalopathy. Past medical history includes frequent falls, CHF, CKD, T2DM, GERD, HTN, OSA. No prior history of dysphagia found in chart.      SLP Plan  All goals met;Consult other service (comment) [facility] Liaison)      Recommendations for follow up therapy are one component of a multi-disciplinary discharge planning process, led by the attending physician.  Recommendations may be updated based on patient status, additional functional criteria and insurance authorization.    Recommendations  Diet recommendations: Regular;Thin liquid (w/ cut foods, moistened foods of choice) Liquids provided via: Cup;Straw (monitor) Medication Administration: Whole meds with puree (vs Crushed in Puree if needed) Supervision: Patient able to self feed;Staff to assist with self feeding;Intermittent supervision to cue for compensatory strategies Compensations: Minimize environmental distractions;Slow rate;Small sips/bites;Lingual sweep for clearance of pocketing;Follow solids with liquid Postural Changes and/or Swallow Maneuvers: Out of bed for meals;Seated upright 90 degrees;Upright 30-60 min after meal                 [facility] Liaison;  dietician) Oral care  BID;Oral care before and after PO;Staff/trained caregiver to provide oral care   Frequent or constant Supervision/Assistance Dysphagia, oropharyngeal phase (R13.12)     All goals met;Consult other service (comment) (Hospice Liaison)       Emma Edward, MS, CCC-SLP Speech Language Pathologist Rehab Services;  [facility] [phone] (ascom) Emma Guzman  09/02/2023, 12:54 PM

## 2023-09-02 NOTE — Progress Notes (Signed)
 OT Cancellation Note  Patient Details Name: Emma Guzman MRN: [id number] DOB: 02-15-1946   Cancelled Treatment:    Reason Eval/Treat Not Completed: Medical issues which prohibited therapy. Orders received - since OT consult, pt has been transferred from [facility] to 2A and is now in ICU due to change in medical status. OT will complete orders and eval when new orders received.   Willford Rabideau L. Camera Krienke, OTR/L  09/02/23, 10:08 AM

## 2023-09-02 NOTE — Progress Notes (Signed)
*  PRELIMINARY RESULTS* Echocardiogram 2D Echocardiogram has been performed.  Emma Guzman 09/02/2023, 8:01 AM

## 2023-09-02 NOTE — TOC Progression Note (Signed)
 Transition of Care [facility]) - Progression Note    Patient Details  Name: Emma Guzman MRN: [id number] Date of Birth: Jul 18, 1945  Transition of Care [facility]) CM/SW Contact  Chavela Justiniano A Andra Heslin, RN Phone Number: 09/02/2023, 1:19 PM  Clinical Narrative:    Chart reviewed.  I have spoken with [name].  She informs me that she is HOH.  She reports that she would like to speak to someone with Hospice.  She would like for me to reach out to her son [name] to provide choice.    I have spoken with [name] about [name]'s wishers to speak with Hospice.  [name] reports that he has no Hospice preference.  I have asked [place] with [facility] to accept Hospice referral. [name] informs me that [name] had been at [facility] for about 1 month.  [name] reports that the goal was for her to get stronger and return home. [name] reports that [name] continued to decline at the facility.    I have spoken with Bonnell Butcher at [facility] and she informs me that [name] was a at [facility] for short-term rehab.  She informed me that if [name] would like to return she would be in her co-pay days.  She informs me that she would have to pay 203 per day and would require SNF authorization.  I have informed Bonnell Butcher with [facility] that patient has requested Hospice.  Will follow up with [facility] for disposition.    TOC will continue to follow for discharge planning.       Expected Discharge Plan: Skilled Nursing Facility Barriers to Discharge: Continued Medical Work up  Expected Discharge Plan and Services                                               Social Determinants of Health (SDOH) Interventions SDOH Screenings   Food Insecurity: Patient Unable To Answer (09/01/2023)  Housing: Patient Unable To Answer (09/01/2023)  Transportation Needs: Patient Unable To Answer (09/01/2023)  Utilities: Patient Unable To Answer (09/01/2023)  Financial Resource Strain: Low Risk  (09/15/2021)   Received from [facility]  Physical Activity: Inactive (09/15/2021)   Received from [facility]  Social Connections: Patient Unable To Answer (09/01/2023)  Stress: Stress Concern Present (09/15/2021)   Received from [facility]  Tobacco Use: Low Risk  (09/02/2023)    Readmission Risk Interventions     No data to display

## 2023-09-02 NOTE — Progress Notes (Signed)
 PROGRESS NOTE    Emma Guzman  ZOX:[id number] DOB: 25-May-1945 DOA: 09/01/2023 PCP: Alexander Anes, MD    Brief Narrative:  78 year old female with a past medical history of HFrEF status post ICD, CKD, diabetes, and hypertension who presents to the hospital with weakness and altered mental status.   Patient is unable to provide history and she is confused and unresponsive.  History was obtained from review of the medical record.  Patient's son had found her confused and unable to speak prompting presentation to the emergency department.  This was a subacute decline over the past month with sudden worsening over the last 2 days.   On presentation to the emergency department, she was afebrile but with soft blood pressure.  She had an AKI and urinalysis that was suggestive of a UTI.  Head imaging was unremarkable.  She was given IV antibiotics and admitted to the medicine service.  Renal ultrasound does not show any hydronephrosis but does suggest cystitis.  Urine and blood cultures are pending.   Following admission to the floor, patient was persistently hypotensive despite volume resuscitation.  She was transferred to the ICU for initiation of vasopressor support.  6/2: Blood pressure stabilized.  Off vasopressor support. Lengthy conversation with patient and son at bedside.  When I stated that patient was making clinical improvement she said "that is not a good thing" follow-up conversation revealed the patient is functionally declining and has had recurrent hospital admission admissions.  She no longer has the desire to come back and forth to the hospital and feels that her quality of life is not at the level that she would find except above.  Discussed with son at bedside, patient, bedside RN and TOC.  Will request evaluation by hospice liaison.   Assessment & Plan:   Principal Problem:   AMS (altered mental status) Active Problems:   Sepsis (HCC)   #Septic Shock #Toxic  Metabolic Encephalopathy #HFrEF s/p ICD #UTI #IDDM   Will transfer to floor.  Will not escalate back to intensive care at this time considering goals of care.  Continue IV antibiotics for now pending evaluation by hospice liaison and assistance in medical decision making.  Advance diet to regular.  DC sliding scale and CBG checks.  DC IV fluids and telemetry monitoring.  Functional decline Goals of care  Patient has clearly stated that she no longer wishes for aggressive medical care and wishes to be made comfortable.  She states she is ready to "go home" she made her wishes clear on my conversation with her.  Patient son was at bedside.  Will engage TOC and hospice liaison.  Patient will be DNR/DNI CODE STATUS.   DVT prophylaxis: None Code Status: DNR/DNI Family Communication: Son at bedside 6/2 Disposition Plan: Status is: Inpatient Remains inpatient appropriate because: Sepsis/bacteremia   Level of care: Med-Surg  Consultants:  None  Procedures:  None  Antimicrobials: Ceftriaxone    Subjective: Seen and examined.  Resting in bed.  Appears fatigued otherwise stable.  Answers all questions appropriately.  States very clearly that she no longer wishes for aggressive medical care.  Objective: Vitals:   09/02/23 0830 09/02/23 0845 09/02/23 0900 09/02/23 1000  BP: (!) 101/53  110/61 120/67  Pulse: 70 71 72 65  Resp: 18 13 13 12   Temp: 99.3 F (37.4 C) 99.3 F (37.4 C) 99.1 F (37.3 C) 98.8 F (37.1 C)  TempSrc:      SpO2: 97% 95% 97% 96%  Weight:  Height:        Intake/Output Summary (Last 24 hours) at 09/02/2023 1109 Last data filed at 09/02/2023 0801 Gross per 24 hour  Intake 2323.47 ml  Output 250 ml  Net 2073.47 ml   Filed Weights   09/01/23 0641 09/01/23 1700  Weight: 54.3 kg 54.5 kg    Examination:  General exam: NAD.  Appears fatigued and chronically ill Respiratory system: Clear to auscultation. Respiratory effort normal. Cardiovascular system:  S1-S2, RRR, no murmurs, no pedal edema Gastrointestinal system: Thin, soft, NT/ND, normal bowel sounds Central nervous system: Alert and oriented. No focal neurological deficits. Extremities: Markedly decreased power bilateral lower extremities.  Gait not assessed Skin: Very pale and thin with no breakdown Psychiatry: Judgement and insight appear normal. Mood & affect appropriate.     Data Reviewed: I have personally reviewed following labs and imaging studies  CBC: Recent Labs  Lab 09/01/23 0651 09/02/23 0436  WBC 12.0* 14.6*  NEUTROABS 10.2*  --   HGB 11.3* 10.1*  HCT 33.4* 31.0*  MCV 93.0 92.5  PLT 137* 133*   Basic Metabolic Panel: Recent Labs  Lab 09/01/23 0651 09/01/23 0849 09/01/23 1750 09/01/23 1928 09/02/23 0436  NA 138  --  138 138 139  K 4.2  --  2.7* 3.2* 3.2*  CL 103  --  105 105 104  CO2 27  --  21* 20* 23  GLUCOSE 208*  --  117* 173* 243*  BUN 34*  --  33* 35* 43*  CREATININE 1.18*  --  1.15* 1.11* 1.14*  CALCIUM 9.4  --  9.1 9.3 9.4  MG  --  1.3*  --   --   --   PHOS  --  4.0  --   --   --    GFR: Estimated Creatinine Clearance: 30.7 mL/min (A) (by C-G formula based on SCr of 1.14 mg/dL (H)). Liver Function Tests: Recent Labs  Lab 09/01/23 0651  AST 19  ALT 19  ALKPHOS 64  BILITOT 0.9  PROT 6.9  ALBUMIN 3.2*   No results for input(s): "LIPASE", "AMYLASE" in the last 168 hours. No results for input(s): "AMMONIA" in the last 168 hours. Coagulation Profile: No results for input(s): "INR", "PROTIME" in the last 168 hours. Cardiac Enzymes: Recent Labs  Lab 09/01/23 0651  CKTOTAL 151   BNP (last 3 results) No results for input(s): "PROBNP" in the last 8760 hours. HbA1C: No results for input(s): "HGBA1C" in the last 72 hours. CBG: Recent Labs  Lab 09/01/23 1646 09/01/23 1722 09/01/23 1933 09/01/23 1959 09/02/23 0731  GLUCAP 113* 109* 184* 198* 191*   Lipid Profile: No results for input(s): "CHOL", "HDL", "LDLCALC", "TRIG",  "CHOLHDL", "LDLDIRECT" in the last 72 hours. Thyroid Function Tests: Recent Labs    09/01/23 0651  TSH 1.044   Anemia Panel: Recent Labs    09/01/23 0500  VITAMINB12 434   Sepsis Labs: Recent Labs  Lab 09/01/23 1610 09/01/23 1928 09/01/23 2130 09/01/23 2311  LATICACIDVEN 1.5 2.4* 1.6 1.2    Recent Results (from the past 240 hours)  Culture, blood (routine x 2)     Status: None (Preliminary result)   Collection Time: 09/01/23  6:51 AM   Specimen: BLOOD  Result Value Ref Range Status   Specimen Description BLOOD BLOOD RIGHT ARM  Final   Special Requests   Final    BOTTLES DRAWN AEROBIC AND ANAEROBIC Blood Culture adequate volume   Culture  Setup Time   Final    GRAM  NEGATIVE RODS AEROBIC BOTTLE ONLY CRITICAL VALUE NOTED.  VALUE IS CONSISTENT WITH PREVIOUSLY REPORTED AND CALLED VALUE. Performed at [facility], [address] [place], [place] [address]    Culture GRAM NEGATIVE RODS  Final   Report Status PENDING  Incomplete  Culture, blood (routine x 2)     Status: None (Preliminary result)   Collection Time: 09/01/23  6:51 AM   Specimen: BLOOD  Result Value Ref Range Status   Specimen Description BLOOD BLOOD LEFT HAND  Final   Special Requests   Final    BOTTLES DRAWN AEROBIC AND ANAEROBIC Blood Culture adequate volume   Culture  Setup Time   Final    GRAM NEGATIVE RODS IN BOTH AEROBIC AND ANAEROBIC BOTTLES Organism ID to follow CRITICAL RESULT CALLED TO, READ BACK BY AND VERIFIED WITH: WILL ANDERSON, PharMD @2210  09/01/2023 COP Performed at [facility], [address], [place], [place] [address]    Culture GRAM NEGATIVE RODS  Final   Report Status PENDING  Incomplete  Resp panel by RT-PCR (RSV, Flu A&B, Covid) Anterior Nasal Swab     Status: None   Collection Time: 09/01/23  6:51 AM   Specimen: Anterior Nasal Swab  Result Value Ref Range Status   SARS Coronavirus 2 by RT PCR NEGATIVE NEGATIVE Final    Comment: (NOTE) SARS-CoV-2 target  nucleic acids are NOT DETECTED.  The SARS-CoV-2 RNA is generally detectable in upper respiratory specimens during the acute phase of infection. The lowest concentration of SARS-CoV-2 viral copies this assay can detect is 138 copies/mL. A negative result does not preclude SARS-Cov-2 infection and should not be used as the sole basis for treatment or other patient management decisions. A negative result may occur with  improper specimen collection/handling, submission of specimen other than nasopharyngeal swab, presence of viral mutation(s) within the areas targeted by this assay, and inadequate number of viral copies(<138 copies/mL). A negative result must be combined with clinical observations, patient history, and epidemiological information. The expected result is Negative.  Fact Sheet for Patients:  BloggerCourse.com  Fact Sheet for Healthcare Providers:  SeriousBroker.it  This test is no t yet approved or cleared by the [place]  FDA and  has been authorized for detection and/or diagnosis of SARS-CoV-2 by FDA under an Emergency Use Authorization (EUA). This EUA will remain  in effect (meaning this test can be used) for the duration of the COVID-19 declaration under Section 564(b)(1) of the Act, 21 U.S.C.section 360bbb-3(b)(1), unless the authorization is terminated  or revoked sooner.       Influenza A by PCR NEGATIVE NEGATIVE Final   Influenza B by PCR NEGATIVE NEGATIVE Final    Comment: (NOTE) The Xpert Xpress SARS-CoV-2/FLU/RSV plus assay is intended as an aid in the diagnosis of influenza from Nasopharyngeal swab specimens and should not be used as a sole basis for treatment. Nasal washings and aspirates are unacceptable for Xpert Xpress SARS-CoV-2/FLU/RSV testing.  Fact Sheet for Patients: BloggerCourse.com  Fact Sheet for Healthcare  Providers: SeriousBroker.it  This test is not yet approved or cleared by the [place]  FDA and has been authorized for detection and/or diagnosis of SARS-CoV-2 by FDA under an Emergency Use Authorization (EUA). This EUA will remain in effect (meaning this test can be used) for the duration of the COVID-19 declaration under Section 564(b)(1) of the Act, 21 U.S.C. section 360bbb-3(b)(1), unless the authorization is terminated or revoked.     Resp Syncytial Virus by PCR NEGATIVE NEGATIVE Final  Comment: (NOTE) Fact Sheet for Patients: BloggerCourse.com  Fact Sheet for Healthcare Providers: SeriousBroker.it  This test is not yet approved or cleared by the [place]  FDA and has been authorized for detection and/or diagnosis of SARS-CoV-2 by FDA under an Emergency Use Authorization (EUA). This EUA will remain in effect (meaning this test can be used) for the duration of the COVID-19 declaration under Section 564(b)(1) of the Act, 21 U.S.C. section 360bbb-3(b)(1), unless the authorization is terminated or revoked.  Performed at [facility], [address] [place], [place] [address]   Blood Culture ID Panel (Reflexed)     Status: Abnormal   Collection Time: 09/01/23  6:51 AM  Result Value Ref Range Status   Enterococcus faecalis NOT DETECTED NOT DETECTED Final   Enterococcus Faecium NOT DETECTED NOT DETECTED Final   Listeria monocytogenes NOT DETECTED NOT DETECTED Final   Staphylococcus species NOT DETECTED NOT DETECTED Final   Staphylococcus aureus (BCID) NOT DETECTED NOT DETECTED Final   Staphylococcus epidermidis NOT DETECTED NOT DETECTED Final   Staphylococcus lugdunensis NOT DETECTED NOT DETECTED Final   Streptococcus species NOT DETECTED NOT DETECTED Final   Streptococcus agalactiae NOT DETECTED NOT DETECTED Final   Streptococcus pneumoniae NOT DETECTED NOT DETECTED Final    Streptococcus pyogenes NOT DETECTED NOT DETECTED Final   A.calcoaceticus-baumannii NOT DETECTED NOT DETECTED Final   Bacteroides fragilis NOT DETECTED NOT DETECTED Final   Enterobacterales DETECTED (A) NOT DETECTED Final    Comment: Enterobacterales represent a large order of gram negative bacteria, not a single organism. CRITICAL RESULT CALLED TO, READ BACK BY AND VERIFIED WITH: WILL ANDERSON, PharmMD @2210  09/01/2023 COP    Enterobacter cloacae complex NOT DETECTED NOT DETECTED Final   Escherichia coli DETECTED (A) NOT DETECTED Final    Comment: CRITICAL RESULT CALLED TO, READ BACK BY AND VERIFIED WITH: WILL ANDERSON, PharmMD @2210  09/01/2023 COP    Klebsiella aerogenes NOT DETECTED NOT DETECTED Final   Klebsiella oxytoca NOT DETECTED NOT DETECTED Final   Klebsiella pneumoniae NOT DETECTED NOT DETECTED Final   Proteus species NOT DETECTED NOT DETECTED Final   Salmonella species NOT DETECTED NOT DETECTED Final   Serratia marcescens NOT DETECTED NOT DETECTED Final   Haemophilus influenzae NOT DETECTED NOT DETECTED Final   Neisseria meningitidis NOT DETECTED NOT DETECTED Final   Pseudomonas aeruginosa NOT DETECTED NOT DETECTED Final   Stenotrophomonas maltophilia NOT DETECTED NOT DETECTED Final   Candida albicans NOT DETECTED NOT DETECTED Final   Candida auris NOT DETECTED NOT DETECTED Final   Candida glabrata NOT DETECTED NOT DETECTED Final   Candida krusei NOT DETECTED NOT DETECTED Final   Candida parapsilosis NOT DETECTED NOT DETECTED Final   Candida tropicalis NOT DETECTED NOT DETECTED Final   Cryptococcus neoformans/gattii NOT DETECTED NOT DETECTED Final   CTX-M ESBL NOT DETECTED NOT DETECTED Final   Carbapenem resistance IMP NOT DETECTED NOT DETECTED Final   Carbapenem resistance KPC NOT DETECTED NOT DETECTED Final   Carbapenem resistance NDM NOT DETECTED NOT DETECTED Final   Carbapenem resist OXA 48 LIKE NOT DETECTED NOT DETECTED Final   Carbapenem resistance VIM NOT DETECTED  NOT DETECTED Final    Comment: Performed at [facility] [address] [place], [place] [address]  MRSA Next Gen by PCR, Nasal     Status: None   Collection Time: 09/01/23  5:13 PM   Specimen: Nasal Mucosa; Nasal Swab  Result Value Ref Range Status   MRSA by PCR Next Gen NOT DETECTED NOT DETECTED Final  Comment: (NOTE) The GeneXpert MRSA Assay (FDA approved for NASAL specimens only), is one component of a comprehensive MRSA colonization surveillance program. It is not intended to diagnose MRSA infection nor to guide or monitor treatment for MRSA infections. Test performance is not FDA approved in patients less than 61 years old. Performed at [facility], [address], [place], [place] [address]          Radiology Studies: US  RENAL Result Date: 09/01/2023 CLINICAL DATA:  Acute kidney injury. EXAM: RENAL / URINARY TRACT ULTRASOUND COMPLETE COMPARISON:  Abdomen CT 08/18/2021 FINDINGS: Right Kidney: Renal measurements: 12.3 x 4.9 x 5.8 cm = volume: 182 mL. Simple renal cysts evident in the interpolar region, similar to prior CT with 1 measuring up to 2.2 cm in the other measuring 3.3 cm. No hydronephrosis. Parenchymal echogenicity is increased. Left Kidney: Renal measurements: 10.7 x 5.4 x 4.7 cm = volume: 142 mL. Lower pole cyst measures 2.0 cm similar to prior CT. No hydronephrosis. Increased echogenicity. Bladder: Bladder wall appears thickened and irregular with layering debris. Other: None. IMPRESSION: 1. Increased echogenicity of the renal parenchyma bilaterally compatible with medical renal disease. No hydronephrosis. 2. Bilateral renal cysts. 3. Bladder wall appears thickened and irregular with layering debris. Correlation to urinalysis recommended to evaluate for urinary tract infection. Electronically Signed   By: Donnal Fusi M.D.   On: 09/01/2023 12:19   CT ANGIO HEAD NECK W WO CM Result Date: 09/01/2023 EXAM: CTA Head and Neck with Intravenous Contrast.  CLINICAL HISTORY: 78 year old female with declining mental status the last several days. Hypotension. Stroke, follow up. TECHNIQUE: Axial CTA images of the head and neck performed with intravenous contrast. Two-dimensional MIP and/or three-dimensional MIP and volume rendered reformations were performed. Note: Per PQRS, the description of internal carotid artery percent stenosis, including 0 percent or normal exam, is based on [place] American Symptomatic Carotid Endarterectomy Trial (NASCET) criteria. Dose reduction technique was used including one or more of the following: automated exposure control, adjustment of mA and kV according to patient size, and/or iterative reconstruction. CONTRAST: 60mL iohexol  (OMNIPAQUE ) 350 MG/ML injection 75 mL IOHEXOL  350 MG/ML SOLN. COMPARISON: Head CT dated 09/12/2023. FINDINGS: CTA NECK: COMMON CAROTID ARTERIES: No significant stenosis. No dissection or occlusion. INTERNAL CAROTID ARTERIES: No stenosis by NASCET criteria. No dissection or occlusion. Atherosclerotic calcifications of the carotid siphons without significant stenosis. VERTEBRAL ARTERIES: Left dominant vertebral artery. No significant stenosis. No dissection or occlusion. CTA HEAD: ANTERIOR CEREBRAL ARTERIES: No significant stenosis. No occlusion. No aneurysm. MIDDLE CEREBRAL ARTERIES: Moderate stenosis of the anterior temporal branch of the right MCA. No occlusion. No aneurysm. POSTERIOR CEREBRAL ARTERIES: Persistent fetal origin of the bilateral PCAs with hypoplastic bilateral P1 segments. Multifocal stenosis of the bilateral PCAs with moderate stenosis of the distal left P2 segment of the PCA. Moderate stenosis of the right posterior communicating artery continuing into the PCA where there is moderate to severe stenosis of the distal P2 segments. No aneurysm. BASILAR ARTERY: No significant stenosis. No occlusion. No aneurysm. SOFT TISSUES: No acute abnormality. No masses or lymphadenopathy. BONES: No acute  abnormality. IMPRESSION: 1. No large vessel occlusion. 2. Multifocal stenosis of the bilateral PCAs, including moderate stenosis of the distal left P2 segment and moderate to severe stenosis of the distal right P2 segment. 3. Moderate stenosis of the right posterior communicating artery continuing into the PCA. 4. Moderate stenosis of the anterior temporal branch of the right MCA. 5. Atherosclerotic calcifications of the carotid siphons without significant stenosis or  aneurysm. Electronically signed by: Audra Blend MD 09/01/2023 11:36 AM EDT RP Workstation: [id number]   CT Head Wo Contrast Result Date: 09/01/2023 CLINICAL DATA:  78 year old female with declining mental status the last several days. Hypotension. EXAM: CT HEAD WITHOUT CONTRAST TECHNIQUE: Contiguous axial images were obtained from the base of the skull through the vertex without intravenous contrast. RADIATION DOSE REDUCTION: This exam was performed according to the departmental dose-optimization program which includes automated exposure control, adjustment of the mA and/or kV according to patient size and/or use of iterative reconstruction technique. COMPARISON:  Head CT 08/02/2023. FINDINGS: Brain: Stable cerebral volume, within normal limits for age. No midline shift, ventriculomegaly, mass effect, evidence of mass lesion, intracranial hemorrhage or evidence of cortically based acute infarction. New since 08/02/2023 but circumscribed curvilinear 12 mm lacunar type infarct of the left basal ganglia (series 100, image 32). Superimposed patchy and confluent bilateral cerebral white matter hypodensity elsewhere, gray-white differentiation elsewhere appears stable. Vascular: Calcified atherosclerosis at the skull base. No suspicious intracranial vascular hyperdensity. Skull: Hyperostosis.  No acute osseous abnormality identified. Sinuses/Orbits: Visualized paranasal sinuses and mastoids are clear. Other: No acute orbit or scalp soft tissue finding.  IMPRESSION: 1. 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 2. No superimposed acute intracranial abnormality. Underlying chronic white matter disease. Electronically Signed   By: Marlise Simpers M.D.   On: 09/01/2023 07:36   DG Chest Port 1 View Result Date: 09/01/2023 CLINICAL DATA:  Weakness.  Hypotension. EXAM: PORTABLE CHEST 1 VIEW COMPARISON:  08/02/2023 FINDINGS: Cardiopericardial silhouette is at upper limits of normal for size. The lungs are clear without focal pneumonia, edema, pneumothorax or pleural effusion. Interstitial markings are diffusely coarsened with chronic features. Left-sided permanent pacemaker/AICD again noted. Bones are diffusely demineralized. IMPRESSION: Chronic interstitial coarsening without acute cardiopulmonary findings. Electronically Signed   By: Donnal Fusi M.D.   On: 09/01/2023 07:15        Scheduled Meds:  Chlorhexidine  Gluconate Cloth  6 each Topical Daily   docusate sodium   100 mg Oral BID   insulin  aspart  0-5 Units Subcutaneous QHS   insulin  aspart  0-9 Units Subcutaneous TID WC   nystatin  5 mL Oral QID   pantoprazole   40 mg Oral Daily   senna  1 tablet Oral QHS   Continuous Infusions:  cefTRIAXone  (ROCEPHIN )  IV Stopped (09/02/23 6295)     LOS: 1 day    Tiajuana Fluke, MD [facility]   If 7PM-7AM, please contact night-coverage  09/02/2023, 11:09 AM

## 2023-09-03 DIAGNOSIS — R410 Disorientation, unspecified: Secondary | ICD-10-CM | POA: Diagnosis not present

## 2023-09-03 LAB — BASIC METABOLIC PANEL WITH GFR
Anion gap: 10 (ref 5–15)
BUN: 40 mg/dL — ABNORMAL HIGH (ref 8–23)
CO2: 24 mmol/L (ref 22–32)
Calcium: 9.6 mg/dL (ref 8.9–10.3)
Chloride: 102 mmol/L (ref 98–111)
Creatinine, Ser: 0.8 mg/dL (ref 0.44–1.00)
GFR, Estimated: >60 mL/min (ref >60)
Glucose, Bld: 234 mg/dL — ABNORMAL HIGH (ref 70–99)
Potassium: 3 mmol/L — ABNORMAL LOW (ref 3.5–5.1)
Sodium: 136 mmol/L (ref 135–145)

## 2023-09-03 LAB — URINE CULTURE: Culture: >=100000 — AB

## 2023-09-03 LAB — CBC
HCT: 30.3 % — ABNORMAL LOW (ref 36.0–46.0)
Hemoglobin: 10.3 g/dL — ABNORMAL LOW (ref 12.0–15.0)
MCH: 30.8 pg (ref 26.0–34.0)
MCHC: 34 g/dL (ref 30.0–36.0)
MCV: 90.7 fL (ref 80.0–100.0)
Platelets: 142 10*3/uL — ABNORMAL LOW (ref 150–400)
RBC: 3.34 MIL/uL — ABNORMAL LOW (ref 3.87–5.11)
RDW: 12.6 % (ref 11.5–15.5)
WBC: 10.8 10*3/uL — ABNORMAL HIGH (ref 4.0–10.5)
nRBC: 0 % (ref 0.0–0.2)

## 2023-09-03 LAB — MAGNESIUM: Magnesium: 1.7 mg/dL (ref 1.7–2.4)

## 2023-09-03 MED ORDER — OXYCODONE HCL 5 MG PO TABS
5.0000 mg | ORAL_TABLET | ORAL | Status: DC | PRN
  Administered 2023-09-04 – 2023-09-06 (×3): 5 mg via ORAL
  Filled 2023-09-03 (×3): qty 1

## 2023-09-03 MED ORDER — CLONAZEPAM 0.5 MG PO TABS: 0.5000 mg | ORAL_TABLET | Freq: Two times a day (BID) | ORAL | Status: DC | PRN

## 2023-09-03 MED ORDER — POTASSIUM CHLORIDE CRYS ER 20 MEQ PO TBCR
40.0000 meq | EXTENDED_RELEASE_TABLET | Freq: Two times a day (BID) | ORAL | Status: DC
  Administered 2023-09-03: 40 meq via ORAL
  Filled 2023-09-03: qty 2

## 2023-09-03 MED ORDER — METOPROLOL SUCCINATE ER 25 MG PO TB24: 25.0000 mg | ORAL_TABLET | Freq: Every day | ORAL | Status: DC

## 2023-09-03 NOTE — Care Management Important Message (Signed)
 Important Message  Patient Details  Name: Emma Guzman MRN: [id number] Date of Birth: [birth year]   Important Message Given:  Yes - Medicare IM     Joniyah Mallinger W, CMA 09/03/2023, 10:43 AM

## 2023-09-03 NOTE — Progress Notes (Signed)
 PROGRESS NOTE    ELLEEN Guzman  GUY:[id number] DOB: Aug 12, 1945 DOA: 09/01/2023 PCP: Alexander Anes, MD    Brief Narrative:  78 year old female with a past medical history of HFrEF status post ICD, CKD, diabetes, and hypertension who presents to the hospital with weakness and altered mental status.   Patient is unable to provide history and she is confused and unresponsive.  History was obtained from review of the medical record.  Patient's son had found her confused and unable to speak prompting presentation to the emergency department.  This was a subacute decline over the past month with sudden worsening over the last 2 days.   On presentation to the emergency department, she was afebrile but with soft blood pressure.  She had an AKI and urinalysis that was suggestive of a UTI.  Head imaging was unremarkable.  She was given IV antibiotics and admitted to the medicine service.  Renal ultrasound does not show any hydronephrosis but does suggest cystitis.  Urine and blood cultures are pending.   Following admission to the floor, patient was persistently hypotensive despite volume resuscitation.  She was transferred to the ICU for initiation of vasopressor support.  6/2: Blood pressure stabilized.  Off vasopressor support. Lengthy conversation with patient and son at bedside.  When I stated that patient was making clinical improvement she said "that is not a good thing" follow-up conversation revealed the patient is functionally declining and has had recurrent hospital admission admissions.  She no longer has the desire to come back and forth to the hospital and feels that her quality of life is not at the level that she would find except above.  Discussed with son at bedside, patient, bedside RN and TOC.  Will request evaluation by hospice liaison.  6/3: TOC working with patient and family to locate long-term care bed with hospice services.   Assessment & Plan:   Principal Problem:    AMS (altered mental status) Active Problems:   Sepsis (HCC)   #Septic Shock #Toxic Metabolic Encephalopathy #HFrEF s/p ICD #UTI #IDDM   Continue IV ceftriaxone  for now.  Lengthy discussion with patient and son at bedside.  Patient would like to continue to participate in her own care discussion and medical decision making.  As such I feel it is prudent to continue IV antibiotics for now as her encephalopathy was likely secondary to infection.  Remainder of scheduled medications streamlined.  Focus on patient comfort.  Functional decline Goals of care  Patient has clearly stated that she no longer wishes for aggressive medical care and wishes to be made comfortable.  She states she is ready to "go home" she made her wishes clear on my conversation with her.  Patient son was at bedside.  TOC and hospice liaison on board.  Attempting to find a long-term care facility with hospice bed.  Remains DNR/DNI   DVT prophylaxis: None Code Status: DNR/DNI Family Communication: Son at bedside 6/2, 6/3 Disposition Plan: Status is: Inpatient Remains inpatient appropriate because: Sepsis/bacteremia   Level of care: Med-Surg  Consultants:  None  Procedures:  None  Antimicrobials: Ceftriaxone    Subjective: Seen and examined.  Resting in bed.  Appears fatigued otherwise stable.  Eating breakfast.  Answers all questions appropriately.  Objective: Vitals:   09/02/23 1659 09/02/23 2004 09/03/23 0553 09/03/23 0758  BP: 113/62 131/63 125/67 (!) 143/76  Pulse: 81 85 69 67  Resp: 17 16 16 18   Temp: 98.4 F (36.9 C) 99.2 F (37.3 C) 97.9 F (  36.6 C) 97.8 F (36.6 C)  TempSrc:    Oral  SpO2: 97% 98% 96% 99%  Weight:      Height:        Intake/Output Summary (Last 24 hours) at 09/03/2023 1347 Last data filed at 09/03/2023 0553 Gross per 24 hour  Intake --  Output 1050 ml  Net -1050 ml   Filed Weights   09/01/23 0641 09/01/23 1700  Weight: 54.3 kg 54.5 kg     Examination:  General exam: No acute distress.  Fatigued chronically ill-appearing Respiratory system: Lungs clear.  Normal work of breathing.  Room air Cardiovascular system: S1-S2, RRR, no murmurs, no pedal edema Gastrointestinal system: Thin, soft, NT/ND, normal bowel sounds Central nervous system: Alert and oriented. No focal neurological deficits. Extremities: Markedly decreased power bilateral lower extremities.  Gait not assessed Skin: Very pale and thin with no breakdown Psychiatry: Judgement and insight appear normal. Mood & affect appropriate.     Data Reviewed: I have personally reviewed following labs and imaging studies  CBC: Recent Labs  Lab 09/01/23 0651 09/02/23 0436 09/03/23 0430  WBC 12.0* 14.6* 10.8*  NEUTROABS 10.2*  --   --   HGB 11.3* 10.1* 10.3*  HCT 33.4* 31.0* 30.3*  MCV 93.0 92.5 90.7  PLT 137* 133* 142*   Basic Metabolic Panel: Recent Labs  Lab 09/01/23 0651 09/01/23 0849 09/01/23 1750 09/01/23 1928 09/02/23 0436 09/03/23 0430  NA 138  --  138 138 139 136  K 4.2  --  2.7* 3.2* 3.2* 3.0*  CL 103  --  105 105 104 102  CO2 27  --  21* 20* 23 24  GLUCOSE 208*  --  117* 173* 243* 234*  BUN 34*  --  33* 35* 43* 40*  CREATININE 1.18*  --  1.15* 1.11* 1.14* 0.80  CALCIUM 9.4  --  9.1 9.3 9.4 9.6  MG  --  1.3*  --   --   --  1.7  PHOS  --  4.0  --   --   --   --    GFR: Estimated Creatinine Clearance: 43.7 mL/min (by C-G formula based on SCr of 0.8 mg/dL). Liver Function Tests: Recent Labs  Lab 09/01/23 0651  AST 19  ALT 19  ALKPHOS 64  BILITOT 0.9  PROT 6.9  ALBUMIN 3.2*   No results for input(s): "LIPASE", "AMYLASE" in the last 168 hours. No results for input(s): "AMMONIA" in the last 168 hours. Coagulation Profile: No results for input(s): "INR", "PROTIME" in the last 168 hours. Cardiac Enzymes: Recent Labs  Lab 09/01/23 0651  CKTOTAL 151   BNP (last 3 results) No results for input(s): "PROBNP" in the last 8760  hours. HbA1C: No results for input(s): "HGBA1C" in the last 72 hours. CBG: Recent Labs  Lab 09/01/23 1646 09/01/23 1722 09/01/23 1933 09/01/23 1959 09/02/23 0731  GLUCAP 113* 109* 184* 198* 191*   Lipid Profile: No results for input(s): "CHOL", "HDL", "LDLCALC", "TRIG", "CHOLHDL", "LDLDIRECT" in the last 72 hours. Thyroid Function Tests: Recent Labs    09/01/23 0651  TSH 1.044   Anemia Panel: Recent Labs    09/01/23 0500  VITAMINB12 434   Sepsis Labs: Recent Labs  Lab 09/01/23 1610 09/01/23 1928 09/01/23 2130 09/01/23 2311  LATICACIDVEN 1.5 2.4* 1.6 1.2    Recent Results (from the past 240 hours)  Culture, blood (routine x 2)     Status: None (Preliminary result)   Collection Time: 09/01/23  6:51 AM  Specimen: BLOOD  Result Value Ref Range Status   Specimen Description   Final    BLOOD BLOOD RIGHT ARM Performed at [facility], [address] [place], [place] [address]    Special Requests   Final    BOTTLES DRAWN AEROBIC AND ANAEROBIC Blood Culture adequate volume Performed at [facility], [address] [place], [place] [address]    Culture  Setup Time   Final    GRAM NEGATIVE RODS AEROBIC BOTTLE ONLY CRITICAL VALUE NOTED.  VALUE IS CONSISTENT WITH PREVIOUSLY REPORTED AND CALLED VALUE. Performed at [facility], [address] [place], [place] [address]    Culture   Final    Hillis Lu NEGATIVE RODS IDENTIFICATION TO FOLLOW Performed at [facility], [address] [place], [place] [address]    Report Status PENDING  Incomplete  Culture, blood (routine x 2)     Status: Abnormal (Preliminary result)   Collection Time: 09/01/23  6:51 AM   Specimen: BLOOD  Result Value Ref Range Status   Specimen Description   Final    BLOOD BLOOD LEFT HAND Performed at [facility], [address] [place], [place] [address]    Special Requests   Final    BOTTLES DRAWN AEROBIC AND ANAEROBIC Blood Culture adequate  volume Performed at [facility], [address] [place], [place] [address]    Culture  Setup Time   Final    GRAM NEGATIVE RODS IN BOTH AEROBIC AND ANAEROBIC BOTTLES CRITICAL RESULT CALLED TO, READ BACK BY AND VERIFIED WITH: WILL ANDERSON, PharMD @2210  09/01/2023 COP Performed at [facility], [address] [place], [place] [address]    Culture ESCHERICHIA COLI (A)  Final   Report Status PENDING  Incomplete  Resp panel by RT-PCR (RSV, Flu A&B, Covid) Anterior Nasal Swab     Status: None   Collection Time: 09/01/23  6:51 AM   Specimen: Anterior Nasal Swab  Result Value Ref Range Status   SARS Coronavirus 2 by RT PCR NEGATIVE NEGATIVE Final    Comment: (NOTE) SARS-CoV-2 target nucleic acids are NOT DETECTED.  The SARS-CoV-2 RNA is generally detectable in upper respiratory specimens during the acute phase of infection. The lowest concentration of SARS-CoV-2 viral copies this assay can detect is 138 copies/mL. A negative result does not preclude SARS-Cov-2 infection and should not be used as the sole basis for treatment or other patient management decisions. A negative result may occur with  improper specimen collection/handling, submission of specimen other than nasopharyngeal swab, presence of viral mutation(s) within the areas targeted by this assay, and inadequate number of viral copies(<138 copies/mL). A negative result must be combined with clinical observations, patient history, and epidemiological information. The expected result is Negative.  Fact Sheet for Patients:  BloggerCourse.com  Fact Sheet for Healthcare Providers:  SeriousBroker.it  This test is no t yet approved or cleared by the [place]  FDA and  has been authorized for detection and/or diagnosis of SARS-CoV-2 by FDA under an Emergency Use Authorization (EUA). This EUA will remain  in effect (meaning this test can be used) for the  duration of the COVID-19 declaration under Section 564(b)(1) of the Act, 21 U.S.C.section 360bbb-3(b)(1), unless the authorization is terminated  or revoked sooner.       Influenza A by PCR NEGATIVE NEGATIVE Final   Influenza B by PCR NEGATIVE NEGATIVE Final    Comment: (NOTE) The Xpert Xpress SARS-CoV-2/FLU/RSV plus assay is intended as an  aid in the diagnosis of influenza from Nasopharyngeal swab specimens and should not be used as a sole basis for treatment. Nasal washings and aspirates are unacceptable for Xpert Xpress SARS-CoV-2/FLU/RSV testing.  Fact Sheet for Patients: BloggerCourse.com  Fact Sheet for Healthcare Providers: SeriousBroker.it  This test is not yet approved or cleared by the [place]  FDA and has been authorized for detection and/or diagnosis of SARS-CoV-2 by FDA under an Emergency Use Authorization (EUA). This EUA will remain in effect (meaning this test can be used) for the duration of the COVID-19 declaration under Section 564(b)(1) of the Act, 21 U.S.C. section 360bbb-3(b)(1), unless the authorization is terminated or revoked.     Resp Syncytial Virus by PCR NEGATIVE NEGATIVE Final    Comment: (NOTE) Fact Sheet for Patients: BloggerCourse.com  Fact Sheet for Healthcare Providers: SeriousBroker.it  This test is not yet approved or cleared by the [place]  FDA and has been authorized for detection and/or diagnosis of SARS-CoV-2 by FDA under an Emergency Use Authorization (EUA). This EUA will remain in effect (meaning this test can be used) for the duration of the COVID-19 declaration under Section 564(b)(1) of the Act, 21 U.S.C. section 360bbb-3(b)(1), unless the authorization is terminated or revoked.  Performed at [facility], [address] [place], [place] [address]   Blood Culture ID Panel (Reflexed)     Status: Abnormal    Collection Time: 09/01/23  6:51 AM  Result Value Ref Range Status   Enterococcus faecalis NOT DETECTED NOT DETECTED Final   Enterococcus Faecium NOT DETECTED NOT DETECTED Final   Listeria monocytogenes NOT DETECTED NOT DETECTED Final   Staphylococcus species NOT DETECTED NOT DETECTED Final   Staphylococcus aureus (BCID) NOT DETECTED NOT DETECTED Final   Staphylococcus epidermidis NOT DETECTED NOT DETECTED Final   Staphylococcus lugdunensis NOT DETECTED NOT DETECTED Final   Streptococcus species NOT DETECTED NOT DETECTED Final   Streptococcus agalactiae NOT DETECTED NOT DETECTED Final   Streptococcus pneumoniae NOT DETECTED NOT DETECTED Final   Streptococcus pyogenes NOT DETECTED NOT DETECTED Final   A.calcoaceticus-baumannii NOT DETECTED NOT DETECTED Final   Bacteroides fragilis NOT DETECTED NOT DETECTED Final   Enterobacterales DETECTED (A) NOT DETECTED Final    Comment: Enterobacterales represent a large order of gram negative bacteria, not a single organism. CRITICAL RESULT CALLED TO, READ BACK BY AND VERIFIED WITH: WILL ANDERSON, PharmMD @2210  09/01/2023 COP    Enterobacter cloacae complex NOT DETECTED NOT DETECTED Final   Escherichia coli DETECTED (A) NOT DETECTED Final    Comment: CRITICAL RESULT CALLED TO, READ BACK BY AND VERIFIED WITH: WILL ANDERSON, PharmMD @2210  09/01/2023 COP    Klebsiella aerogenes NOT DETECTED NOT DETECTED Final   Klebsiella oxytoca NOT DETECTED NOT DETECTED Final   Klebsiella pneumoniae NOT DETECTED NOT DETECTED Final   Proteus species NOT DETECTED NOT DETECTED Final   Salmonella species NOT DETECTED NOT DETECTED Final   Serratia marcescens NOT DETECTED NOT DETECTED Final   Haemophilus influenzae NOT DETECTED NOT DETECTED Final   Neisseria meningitidis NOT DETECTED NOT DETECTED Final   Pseudomonas aeruginosa NOT DETECTED NOT DETECTED Final   Stenotrophomonas maltophilia NOT DETECTED NOT DETECTED Final   Candida albicans NOT DETECTED NOT DETECTED  Final   Candida auris NOT DETECTED NOT DETECTED Final   Candida glabrata NOT DETECTED NOT DETECTED Final   Candida krusei NOT DETECTED NOT DETECTED Final   Candida parapsilosis NOT DETECTED NOT DETECTED Final   Candida tropicalis NOT DETECTED NOT DETECTED Final   Cryptococcus neoformans/gattii NOT  DETECTED NOT DETECTED Final   CTX-M ESBL NOT DETECTED NOT DETECTED Final   Carbapenem resistance IMP NOT DETECTED NOT DETECTED Final   Carbapenem resistance KPC NOT DETECTED NOT DETECTED Final   Carbapenem resistance NDM NOT DETECTED NOT DETECTED Final   Carbapenem resist OXA 48 LIKE NOT DETECTED NOT DETECTED Final   Carbapenem resistance VIM NOT DETECTED NOT DETECTED Final    Comment: Performed at [facility], [address], [place], [place] [address]  Urine Culture     Status: Abnormal   Collection Time: 09/01/23  9:07 AM   Specimen: Urine, Random  Result Value Ref Range Status   Specimen Description   Final    URINE, RANDOM Performed at [facility], [address] [place], [place] [address]    Special Requests   Final    NONE Reflexed from [id number] Performed at [facility], [address] [place], [place] [address]    Culture >=100,000 COLONIES/mL ESCHERICHIA COLI (A)  Final   Report Status 09/03/2023 FINAL  Final   Organism ID, Bacteria ESCHERICHIA COLI (A)  Final      Susceptibility   Escherichia coli - MIC*    AMPICILLIN >=32 RESISTANT Resistant     CEFAZOLIN  <=4 SENSITIVE Sensitive     CEFEPIME <=0.12 SENSITIVE Sensitive     CEFTRIAXONE  <=0.25 SENSITIVE Sensitive     CIPROFLOXACIN <=0.25 SENSITIVE Sensitive     GENTAMICIN  <=1 SENSITIVE Sensitive     IMIPENEM <=0.25 SENSITIVE Sensitive     NITROFURANTOIN <=16 SENSITIVE Sensitive     TRIMETH/SULFA <=20 SENSITIVE Sensitive     AMPICILLIN/SULBACTAM 16 INTERMEDIATE Intermediate     PIP/TAZO <=4 SENSITIVE Sensitive ug/mL    * >=100,000 COLONIES/mL ESCHERICHIA COLI  MRSA Next Gen by PCR, Nasal      Status: None   Collection Time: 09/01/23  5:13 PM   Specimen: Nasal Mucosa; Nasal Swab  Result Value Ref Range Status   MRSA by PCR Next Gen NOT DETECTED NOT DETECTED Final    Comment: (NOTE) The GeneXpert MRSA Assay (FDA approved for NASAL specimens only), is one component of a comprehensive MRSA colonization surveillance program. It is not intended to diagnose MRSA infection nor to guide or monitor treatment for MRSA infections. Test performance is not FDA approved in patients less than 8 years old. Performed at [facility], [address], [place], [place] [address]          Radiology Studies: ECHOCARDIOGRAM COMPLETE Result Date: 09/02/2023    ECHOCARDIOGRAM REPORT   Patient Name:   Emma Guzman Rhem Date of Exam: 09/02/2023 Medical Rec #:  [id number]         Height:       61.0 in Accession #:    [id number]        Weight:       120.1 lb Date of Birth:  1945/07/21         BSA:          1.521 m Patient Age:    78 years          BP:           96/55 mmHg Patient Gender: F                 HR:           74 bpm. Exam Location:  [facility] Procedure: 2D Echo, Cardiac Doppler and Color Doppler (Both Spectral and Color  Flow Doppler were utilized during procedure). Indications:     Shock R57.9  History:         Patient has no prior history of Echocardiogram examinations.                  CHF, Pacemaker; Risk Factors:Hypertension.  Sonographer:     Broadus Canes Referring Phys:  [id number] KHABIB DGAYLI Diagnosing Phys: Joetta Mustache  Sonographer Comments: No parasternal window. Image acquisition challenging due to patient body habitus. IMPRESSIONS  1. Technically difficult study. Definity not used.  2. Left ventricular ejection fraction, by estimation, is 50 to 55%. The left ventricle has low normal function. Left ventricular endocardial border not optimally defined to evaluate regional wall motion. Left ventricular diastolic parameters are indeterminate.  3. Right ventricular systolic function  was not well visualized. The right ventricular size is not well visualized.  4. The mitral valve is normal in structure. Trivial mitral valve regurgitation.  5. The aortic valve was not well visualized. Aortic valve regurgitation is trivial. Aortic valve sclerosis/calcification is present, without any evidence of aortic stenosis. FINDINGS  Left Ventricle: Left ventricular ejection fraction, by estimation, is 50 to 55%. The left ventricle has low normal function. Left ventricular endocardial border not optimally defined to evaluate regional wall motion. The left ventricular internal cavity  size was normal in size. There is no left ventricular hypertrophy. Left ventricular diastolic parameters are indeterminate.  LV Wall Scoring: The entire anterior wall, entire lateral wall, entire inferior wall, basal inferoseptal segment, and apex are normal. Right Ventricle: The right ventricular size is not well visualized. Right vetricular wall thickness was not well visualized. Right ventricular systolic function was not well visualized. Left Atrium: Left atrial size was normal in size. Right Atrium: Right atrial size was normal in size. Pericardium: There is no evidence of pericardial effusion. Mitral Valve: The mitral valve is normal in structure. Trivial mitral valve regurgitation. MV peak gradient, 2.4 mmHg. The mean mitral valve gradient is 1.0 mmHg. Tricuspid Valve: The tricuspid valve is not well visualized. Tricuspid valve regurgitation is trivial. Aortic Valve: The aortic valve was not well visualized. Aortic valve regurgitation is trivial. Aortic valve sclerosis/calcification is present, without any evidence of aortic stenosis. Aortic valve mean gradient measures 3.0 mmHg. Aortic valve peak gradient measures 6.2 mmHg. Aortic valve area, by VTI measures 2.64 cm. Pulmonic Valve: The pulmonic valve was not assessed. Pulmonic valve regurgitation is not visualized. Aorta: The aortic root is normal in size and structure.  IAS/Shunts: The interatrial septum was not well visualized. Additional Comments: A device lead is visualized.  LEFT VENTRICLE PLAX 2D LVIDd:         3.50 cm   Diastology LVIDs:         2.60 cm   LV e' medial:    3.81 cm/s LV PW:         0.80 cm   LV E/e' medial:  17.5 LV IVS:        1.00 cm   LV e' lateral:   4.35 cm/s LVOT diam:     1.90 cm   LV E/e' lateral: 15.3 LV SV:         54 LV SV Index:   35 LVOT Area:     2.84 cm  RIGHT VENTRICLE RV Basal diam:  4.00 cm RV Mid diam:    3.10 cm LEFT ATRIUM             Index  RIGHT ATRIUM           Index LA diam:        3.10 cm 2.04 cm/m   RA Area:     15.40 cm LA Vol (A2C):   34.6 ml 22.75 ml/m  RA Volume:   41.80 ml  27.48 ml/m LA Vol (A4C):   28.0 ml 18.41 ml/m LA Biplane Vol: 31.2 ml 20.51 ml/m  AORTIC VALVE AV Area (Vmax):    2.08 cm AV Area (Vmean):   2.24 cm AV Area (VTI):     2.64 cm AV Vmax:           124.00 cm/s AV Vmean:          82.800 cm/s AV VTI:            0.203 m AV Peak Grad:      6.2 mmHg AV Mean Grad:      3.0 mmHg LVOT Vmax:         91.00 cm/s LVOT Vmean:        65.300 cm/s LVOT VTI:          0.189 m LVOT/AV VTI ratio: 0.93  AORTA Ao Root diam: 2.10 cm MITRAL VALVE               TRICUSPID VALVE MV Area (PHT): 3.19 cm    TR Peak grad:   19.5 mmHg MV Area VTI:   2.49 cm    TR Vmax:        221.00 cm/s MV Peak grad:  2.4 mmHg MV Mean grad:  1.0 mmHg    SHUNTS MV Vmax:       0.78 m/s    Systemic VTI:  0.19 m MV Vmean:      50.0 cm/s   Systemic Diam: 1.90 cm MV Decel Time: 238 msec MV E velocity: 66.60 cm/s MV A velocity: 83.30 cm/s MV E/A ratio:  0.80 Lida Reeks Alluri Electronically signed by Joetta Mustache Signature Date/Time: 09/02/2023/12:21:48 PM    Final         Scheduled Meds:  nystatin  5 mL Oral QID   Continuous Infusions:  cefTRIAXone  (ROCEPHIN )  IV 2 g (09/03/23 0504)     LOS: 2 days    Tiajuana Fluke, MD [facility]   If 7PM-7AM, please contact night-coverage  09/03/2023, 1:47 PM

## 2023-09-03 NOTE — Plan of Care (Signed)

## 2023-09-03 NOTE — Progress Notes (Addendum)
 [facility] CLINIC CARDIOLOGY PROGRESS NOTE   Patient ID: ARIES KASA MRN: [id number] DOB/AGE: 78/20/1947 78 y.o.  Admit date: 09/01/2023 Referring Physician Dr. Antoniette Batty  Primary Physician Alexander Anes, MD Primary Cardiologist Dr. Parks Bollman Reason for Consultation frequent PVCs, possible Nonsustained VT  HPI: SAVINA OLSHEFSKI is a 78 y.o. female from nursing home with a past medical history of Nonischemic cardiomyopathy s/p Bi-V ICD (changeout in 2016), chronic systolic congestive heart failure, hypertension, hyperlipidemia, OSA, IDDM, CKD stage I who presented to the ED on 09/01/2023 for worsening generalized weakness and altered mental status. Patient mostly bedbound recently due to weakness. Nursing home staff reports reduced PO intake. Patient with significant waxing and waning confusion. CT head revealed lacunar infarct. MRI not performed due to device incompatibility. Per tele during admission reportedly revealed episodes of nonsustained VT and PVCs. Cardiology consulted for further evaluation.   Interval History: -Patient seen and examined this AM and laying flat comfortably in hospital bed. Patient states she feels fine and without cardiac sxs.  -Patients BP improved and stabilizes, HR stable. Off tele events.  -Patient remains on room air with stable SpO2.   Review of systems complete and found to be negative unless listed above    Vitals:   09/02/23 1659 09/02/23 2004 09/03/23 0553 09/03/23 0758  BP: 113/62 131/63 125/67 (!) 143/76  Pulse: 81 85 69 67  Resp: 17 16 16 18   Temp: 98.4 F (36.9 C) 99.2 F (37.3 C) 97.9 F (36.6 C) 97.8 F (36.6 C)  TempSrc:    Oral  SpO2: 97% 98% 96% 99%  Weight:      Height:         Intake/Output Summary (Last 24 hours) at 09/03/2023 1157 Last data filed at 09/03/2023 0553 Gross per 24 hour  Intake --  Output 1050 ml  Net -1050 ml     PHYSICAL EXAM General: well appearing , well nourished, in no acute distress. HEENT:  Normocephalic and atraumatic. Neck: No JVD.  Lungs: Normal respiratory effort on room air. Clear bilaterally to auscultation. No wheezes, crackles, rhonchi.  Heart: HRRR. Normal S1 and S2 without gallops or murmurs. Radial & DP pulses 2+ bilaterally. Abdomen: Non-distended appearing.  Msk: Normal strength and tone for age. Extremities: No clubbing, cyanosis or edema.   Neuro: Alert and oriented X 3. Psych: Mood appropriate, affect congruent.    LABS: Basic Metabolic Panel: Recent Labs    09/01/23 0849 09/01/23 1750 09/02/23 0436 09/03/23 0430  NA  --    < > 139 136  K  --    < > 3.2* 3.0*  CL  --    < > 104 102  CO2  --    < > 23 24  GLUCOSE  --    < > 243* 234*  BUN  --    < > 43* 40*  CREATININE  --    < > 1.14* 0.80  CALCIUM  --    < > 9.4 9.6  MG 1.3*  --   --  1.7  PHOS 4.0  --   --   --    < > = values in this interval not displayed.   Liver Function Tests: Recent Labs    09/01/23 0651  AST 19  ALT 19  ALKPHOS 64  BILITOT 0.9  PROT 6.9  ALBUMIN 3.2*   No results for input(s): "LIPASE", "AMYLASE" in the last 72 hours. CBC: Recent Labs    09/01/23 0651 09/02/23 0436 09/03/23  0430  WBC 12.0* 14.6* 10.8*  NEUTROABS 10.2*  --   --   HGB 11.3* 10.1* 10.3*  HCT 33.4* 31.0* 30.3*  MCV 93.0 92.5 90.7  PLT 137* 133* 142*   Cardiac Enzymes: Recent Labs    09/01/23 0651 09/01/23 0838  CKTOTAL 151  --   TROPONINIHS 19* 16   BNP: Recent Labs    09/01/23 0651  BNP 62.9   D-Dimer: No results for input(s): "DDIMER" in the last 72 hours. Hemoglobin A1C: No results for input(s): "HGBA1C" in the last 72 hours. Fasting Lipid Panel: No results for input(s): "CHOL", "HDL", "LDLCALC", "TRIG", "CHOLHDL", "LDLDIRECT" in the last 72 hours. Thyroid Function Tests: Recent Labs    09/01/23 0651  TSH 1.044   Anemia Panel: Recent Labs    09/01/23 0500  VITAMINB12 434    ECHOCARDIOGRAM COMPLETE Result Date: 09/02/2023    ECHOCARDIOGRAM REPORT   Patient  Name:   JAZMINE HECKMAN Vinluan Date of Exam: 09/02/2023 Medical Rec #:  [id number]         Height:       61.0 in Accession #:    [id number]        Weight:       120.1 lb Date of Birth:  01/11/1946         BSA:          1.521 m Patient Age:    70 years          BP:           96/55 mmHg Patient Gender: F                 HR:           74 bpm. Exam Location:  [facility] Procedure: 2D Echo, Cardiac Doppler and Color Doppler (Both Spectral and Color            Flow Doppler were utilized during procedure). Indications:     Shock R57.9  History:         Patient has no prior history of Echocardiogram examinations.                  CHF, Pacemaker; Risk Factors:Hypertension.  Sonographer:     Broadus Canes Referring Phys:  [id number] KHABIB DGAYLI Diagnosing Phys: Joetta Mustache  Sonographer Comments: No parasternal window. Image acquisition challenging due to patient body habitus. IMPRESSIONS  1. Technically difficult study. Definity not used.  2. Left ventricular ejection fraction, by estimation, is 50 to 55%. The left ventricle has low normal function. Left ventricular endocardial border not optimally defined to evaluate regional wall motion. Left ventricular diastolic parameters are indeterminate.  3. Right ventricular systolic function was not well visualized. The right ventricular size is not well visualized.  4. The mitral valve is normal in structure. Trivial mitral valve regurgitation.  5. The aortic valve was not well visualized. Aortic valve regurgitation is trivial. Aortic valve sclerosis/calcification is present, without any evidence of aortic stenosis. FINDINGS  Left Ventricle: Left ventricular ejection fraction, by estimation, is 50 to 55%. The left ventricle has low normal function. Left ventricular endocardial border not optimally defined to evaluate regional wall motion. The left ventricular internal cavity  size was normal in size. There is no left ventricular hypertrophy. Left ventricular diastolic parameters are  indeterminate.  LV Wall Scoring: The entire anterior wall, entire lateral wall, entire inferior wall, basal inferoseptal segment, and apex are normal. Right Ventricle: The right ventricular size is not well visualized. Right  vetricular wall thickness was not well visualized. Right ventricular systolic function was not well visualized. Left Atrium: Left atrial size was normal in size. Right Atrium: Right atrial size was normal in size. Pericardium: There is no evidence of pericardial effusion. Mitral Valve: The mitral valve is normal in structure. Trivial mitral valve regurgitation. MV peak gradient, 2.4 mmHg. The mean mitral valve gradient is 1.0 mmHg. Tricuspid Valve: The tricuspid valve is not well visualized. Tricuspid valve regurgitation is trivial. Aortic Valve: The aortic valve was not well visualized. Aortic valve regurgitation is trivial. Aortic valve sclerosis/calcification is present, without any evidence of aortic stenosis. Aortic valve mean gradient measures 3.0 mmHg. Aortic valve peak gradient measures 6.2 mmHg. Aortic valve area, by VTI measures 2.64 cm. Pulmonic Valve: The pulmonic valve was not assessed. Pulmonic valve regurgitation is not visualized. Aorta: The aortic root is normal in size and structure. IAS/Shunts: The interatrial septum was not well visualized. Additional Comments: A device lead is visualized.  LEFT VENTRICLE PLAX 2D LVIDd:         3.50 cm   Diastology LVIDs:         2.60 cm   LV e' medial:    3.81 cm/s LV PW:         0.80 cm   LV E/e' medial:  17.5 LV IVS:        1.00 cm   LV e' lateral:   4.35 cm/s LVOT diam:     1.90 cm   LV E/e' lateral: 15.3 LV SV:         54 LV SV Index:   35 LVOT Area:     2.84 cm  RIGHT VENTRICLE RV Basal diam:  4.00 cm RV Mid diam:    3.10 cm LEFT ATRIUM             Index        RIGHT ATRIUM           Index LA diam:        3.10 cm 2.04 cm/m   RA Area:     15.40 cm LA Vol (A2C):   34.6 ml 22.75 ml/m  RA Volume:   41.80 ml  27.48 ml/m LA Vol (A4C):    28.0 ml 18.41 ml/m LA Biplane Vol: 31.2 ml 20.51 ml/m  AORTIC VALVE AV Area (Vmax):    2.08 cm AV Area (Vmean):   2.24 cm AV Area (VTI):     2.64 cm AV Vmax:           124.00 cm/s AV Vmean:          82.800 cm/s AV VTI:            0.203 m AV Peak Grad:      6.2 mmHg AV Mean Grad:      3.0 mmHg LVOT Vmax:         91.00 cm/s LVOT Vmean:        65.300 cm/s LVOT VTI:          0.189 m LVOT/AV VTI ratio: 0.93  AORTA Ao Root diam: 2.10 cm MITRAL VALVE               TRICUSPID VALVE MV Area (PHT): 3.19 cm    TR Peak grad:   19.5 mmHg MV Area VTI:   2.49 cm    TR Vmax:        221.00 cm/s MV Peak grad:  2.4 mmHg MV Mean grad:  1.0 mmHg  SHUNTS MV Vmax:       0.78 m/s    Systemic VTI:  0.19 m MV Vmean:      50.0 cm/s   Systemic Diam: 1.90 cm MV Decel Time: 238 msec MV E velocity: 66.60 cm/s MV A velocity: 83.30 cm/s MV E/A ratio:  0.80 Joetta Mustache Electronically signed by Joetta Mustache Signature Date/Time: 09/02/2023/12:21:48 PM    Final      ECHO as above  TELEMETRY reviewed by me 09/03/23: off tele  EKG reviewed by me 09/03/23: atrial sensed, ventricular paced with nonspecific ST-T wave changes, rate 89 bpm  DATA reviewed by me 09/03/23: last 24h vitals tele labs imaging I/O hospitalist progress notes.  Principal Problem:   AMS (altered mental status) Active Problems:   Sepsis (HCC)    ASSESSMENT AND PLAN: MARLEIGH KAYLOR is a 78 y.o. female from nursing home with a past medical history of Nonischemic cardiomyopathy s/p Bi-V ICD (changeout in 2016), chronic systolic congestive heart failure, hypertension, hyperlipidemia, OSA, IDDM, CKD stage I who presented to the ED on 09/01/2023 for worsening generalized weakness and altered mental status. Patient mostly bedbound recently due to weakness. Nursing home staff reports reduced PO intake. Patient with significant waxing and waning confusion. CT head revealed lacunar infarct. MRI not performed due to device incompatibility. Per tele during admission  reportedly revealed episodes of nonsustained VT and PVCs. Cardiology consulted for further evaluation.   # Lacunar Infarct # UTI # Altered mental status # NICM s/p BiV ICD # Hypertension  # Chronic HFpEF (hx rEF, improved) Patient from nursing home reports with worsening generalized weakness and altered mental status. CT revealed lacunar infarct. MRI not performed due to ICD incompatible to MRI. UA revealed UTI. EKG with no evidence of acute ischemic changes. EKG 19 > 16, negative x1 and flat. BNP within normal limits. Poor quality echo this admission with pEF (50-55%). Off pressors as of 06/02 @ 0930, and weaned off midodrine with BP stable. Patient appears euvolemic.  Per tele with occasional PVCs, no evidence of nonsustained VT yesterday 06/02. Today pt off tele   -Monitor and replenish electrolytes for a goal K >4, Mag >2  -Ordered metoprolol succinate 25 mg daily. Can up-titrate as BP allows. -Consider optimizing GDMT as BP and renal function allows. This can be done outpatient. -AICD in place, functioning adequately.   ADDENDUM: Patient going hospice/comfort care. All medications discontinued.    Cardiology will sign off. Please haiku with questions or re-engage if needed.   This patient's case was discussed and created with Dr. Bob Burn and he is in agreement.  Signed:  Creighton Doffing, PA-C  09/03/2023, 11:57 AM [facility] Cardiology

## 2023-09-03 NOTE — Progress Notes (Addendum)
 [facility] Hospice Liaison Note  Hospital Liaison team will follow peripherally until a LTC bed has been found. [facility] hospice will follow at a LTC facility.    Please call with any Hospice related questions or concerns.  Thank you for the opportunity to participate in this patient's care.  [facility] Liaison [phone]

## 2023-09-04 DIAGNOSIS — R6521 Severe sepsis with septic shock: Secondary | ICD-10-CM | POA: Diagnosis not present

## 2023-09-04 DIAGNOSIS — R7881 Bacteremia: Secondary | ICD-10-CM | POA: Diagnosis present

## 2023-09-04 DIAGNOSIS — R410 Disorientation, unspecified: Secondary | ICD-10-CM | POA: Diagnosis not present

## 2023-09-04 DIAGNOSIS — A419 Sepsis, unspecified organism: Secondary | ICD-10-CM

## 2023-09-04 LAB — CULTURE, BLOOD (ROUTINE X 2)
Special Requests: ADEQUATE
Special Requests: ADEQUATE

## 2023-09-04 MED ORDER — GLYCOPYRROLATE 1 MG PO TABS
1.0000 mg | ORAL_TABLET | ORAL | Status: DC | PRN
Start: 2023-09-04 — End: 2023-09-09

## 2023-09-04 MED ORDER — ACETAMINOPHEN 325 MG PO TABS
650.0000 mg | ORAL_TABLET | Freq: Four times a day (QID) | ORAL | Status: DC | PRN
  Administered 2023-09-04: 650 mg via ORAL
  Filled 2023-09-04: qty 2

## 2023-09-04 MED ORDER — ACETAMINOPHEN 650 MG RE SUPP
650.0000 mg | Freq: Four times a day (QID) | RECTAL | Status: DC | PRN
  Administered 2023-09-09: 650 mg via RECTAL
  Filled 2023-09-04: qty 1

## 2023-09-04 MED ORDER — LORAZEPAM 2 MG/ML IJ SOLN
2.0000 mg | INTRAMUSCULAR | Status: DC | PRN
  Administered 2023-09-08 – 2023-09-09 (×2): 2 mg via INTRAVENOUS
  Filled 2023-09-04 (×2): qty 1

## 2023-09-04 MED ORDER — MORPHINE SULFATE (PF) 2 MG/ML IV SOLN
2.0000 mg | INTRAVENOUS | Status: DC | PRN
  Administered 2023-09-05 – 2023-09-06 (×5): 2 mg via INTRAVENOUS
  Administered 2023-09-07: 4 mg via INTRAVENOUS
  Administered 2023-09-07 – 2023-09-09 (×12): 2 mg via INTRAVENOUS
  Filled 2023-09-04 (×8): qty 1
  Filled 2023-09-04: qty 2
  Filled 2023-09-04 (×11): qty 1

## 2023-09-04 MED ORDER — POLYVINYL ALCOHOL 1.4 % OP SOLN: 1.0000 [drp] | Freq: Four times a day (QID) | OPHTHALMIC | Status: DC | PRN

## 2023-09-04 MED ORDER — GLYCOPYRROLATE 0.2 MG/ML IJ SOLN: 0.2000 mg | INTRAMUSCULAR | Status: DC | PRN

## 2023-09-04 MED ORDER — GLYCOPYRROLATE 0.2 MG/ML IJ SOLN
0.2000 mg | INTRAMUSCULAR | Status: DC | PRN
Start: 2023-09-04 — End: 2023-09-09
  Administered 2023-09-08 – 2023-09-09 (×3): 0.2 mg via INTRAVENOUS
  Filled 2023-09-04 (×3): qty 1

## 2023-09-04 NOTE — Progress Notes (Addendum)
 Progress Note    Emma Guzman  WGN:[id number] DOB: 1946-03-22  DOA: 09/01/2023 PCP: Alexander Anes, MD      Brief Narrative:    Medical records reviewed and are as summarized below:  Emma Guzman is a 78 y.o. female with past medical history of HFrEF status post ICD, CKD, diabetes with previous DKA, and hypertension, peripheral neuropathy, recent hospitalization about a month prior to admission for fall and rhabdomyolysis, who presented to the hospital with general weakness, word finding difficulty and altered mental status.  She was hypotensive in the ED.  She was started on IV fluids and antibiotics for sepsis and UTI and was admitted to the medicine service.  However, despite IV fluids, patient became more hypotensive.  Intensivist was consulted and patient was transferred to the ICU for further management.  She was treated with IV vasopressors.       Assessment/Plan:   Principal Problem:   Septic shock (HCC) Active Problems:   E. coli UTI   E coli bacteremia   AMS (altered mental status)    Body mass index is 22.7 kg/m.   Septic shock secondary to acute E. coli UTI and bacteremia Acute toxic metabolic encephalopathy History of HFrEF s/p ICD but now with recovered EF (2D echo on 09/02/2023 showed EF estimated at 50 to 55%). Insulin -dependent diabetes mellitus Nausea and vomiting   PLAN   S/p treatment with 4 days of IV ceftriaxone . She was also treated with IV fluids and vasopressors. Her condition has slowly worsened and she has become more lethargic and has not been eating. Discussed diagnosis, prognosis and goals of care with patient and her son, [name], at the bedside.  [name] deferred the decision making to the patient.  [name] asked multiple times whether she wanted to continue with aggressive care versus comfort care.  Patient indicated that she wanted comfort care.  [name] agrees with her decision. Patient has been transitioned to comfort  care. Start IV morphine as needed for pain, IV Zofran  as needed for nausea/vomiting.  IV Ativan as needed for anxiety. Reached out to [facility], hospice liaison, to reevaluate the patient to see if she would be eligible for hospice.    Diet Order             Diet regular Room service appropriate? Yes with Assist; Fluid consistency: Thin  Diet effective now                            Consultants: Intensivists Cardiologist  Procedures: None    Medications:    nystatin  5 mL Oral QID   Continuous Infusions:  cefTRIAXone  (ROCEPHIN )  IV Stopped (09/04/23 0909)     Anti-infectives (From admission, onward)    Start     Dose/Rate Route Frequency Ordered Stop   09/02/23 1000  cefTRIAXone  (ROCEPHIN ) 1 g in sodium chloride  0.9 % 100 mL IVPB  Status:  Discontinued        1 g 200 mL/hr over 30 Minutes Intravenous Every 24 hours 09/01/23 1036 09/01/23 1830   09/02/23 0600  cefTRIAXone  (ROCEPHIN ) 2 g in sodium chloride  0.9 % 100 mL IVPB        2 g 200 mL/hr over 30 Minutes Intravenous Every 24 hours 09/01/23 2227     09/01/23 2000  piperacillin-tazobactam (ZOSYN) IVPB 3.375 g  Status:  Discontinued        3.375 g 12.5 mL/hr over 240 Minutes Intravenous  Every 8 hours 09/01/23 1830 09/01/23 2227   09/01/23 1015  cefTRIAXone  (ROCEPHIN ) 1 g in sodium chloride  0.9 % 100 mL IVPB        1 g 200 mL/hr over 30 Minutes Intravenous STAT 09/01/23 1004 09/01/23 1123              Family Communication/Anticipated D/C date and plan/Code Status   DVT prophylaxis:      Code Status: Do not attempt resuscitation (DNR) - Comfort care  Family Communication: Plan discussed with with [name], son, at the bedside Disposition Plan: Plan to discharge to hospice house   Status is: Inpatient Remains inpatient appropriate because: Comfort care       Subjective:   Interval events noted.  She is unable to provide an adequate history.  [name], son, was at the bedside and said  that patient has gotten worse compared to yesterday.  He said patient has become less responsive and has not been eating today.  Patient vomited while also at the bedside.  Objective:    Vitals:   09/03/23 1535 09/03/23 2011 09/04/23 0419 09/04/23 0836  BP: (!) 149/75 (!) 145/86 (!) 151/85 (!) 137/92  Pulse: 79 83 87 87  Resp: 18   16  Temp: (!) 97.5 F (36.4 C) 99.5 F (37.5 C) 99.9 F (37.7 C) 97.6 F (36.4 C)  TempSrc:      SpO2: 98% 98% 98% 98%  Weight:      Height:       No data found.   Intake/Output Summary (Last 24 hours) at 09/04/2023 1305 Last data filed at 09/03/2023 2011 Gross per 24 hour  Intake 0 ml  Output 800 ml  Net -800 ml   Filed Weights   09/01/23 0641 09/01/23 1700  Weight: 54.3 kg 54.5 kg    Exam:  GEN: NAD SKIN: Warm and dry EYES: No pallor or icterus ENT: MMM CV: RRR PULM: CTA B ABD: soft, ND, NT, +BS CNS: Drowsy but arousable.  Oriented to person and place.  Speech is soft.  She was able to mouth her concerns. EXT: No edema or tenderness      Data Reviewed:   I have personally reviewed following labs and imaging studies:  Labs: Labs show the following:   Basic Metabolic Panel: Recent Labs  Lab 09/01/23 0651 09/01/23 0849 09/01/23 1750 09/01/23 1928 09/02/23 0436 09/03/23 0430  NA 138  --  138 138 139 136  K 4.2  --  2.7* 3.2* 3.2* 3.0*  CL 103  --  105 105 104 102  CO2 27  --  21* 20* 23 24  GLUCOSE 208*  --  117* 173* 243* 234*  BUN 34*  --  33* 35* 43* 40*  CREATININE 1.18*  --  1.15* 1.11* 1.14* 0.80  CALCIUM 9.4  --  9.1 9.3 9.4 9.6  MG  --  1.3*  --   --   --  1.7  PHOS  --  4.0  --   --   --   --    GFR Estimated Creatinine Clearance: 43.7 mL/min (by C-G formula based on SCr of 0.8 mg/dL). Liver Function Tests: Recent Labs  Lab 09/01/23 0651  AST 19  ALT 19  ALKPHOS 64  BILITOT 0.9  PROT 6.9  ALBUMIN 3.2*   No results for input(s): "LIPASE", "AMYLASE" in the last 168 hours. No results for input(s):  "AMMONIA" in the last 168 hours. Coagulation profile No results for input(s): "INR", "PROTIME" in  the last 168 hours.  CBC: Recent Labs  Lab 09/01/23 0651 09/02/23 0436 09/03/23 0430  WBC 12.0* 14.6* 10.8*  NEUTROABS 10.2*  --   --   HGB 11.3* 10.1* 10.3*  HCT 33.4* 31.0* 30.3*  MCV 93.0 92.5 90.7  PLT 137* 133* 142*   Cardiac Enzymes: Recent Labs  Lab 09/01/23 0651  CKTOTAL 151   BNP (last 3 results) No results for input(s): "PROBNP" in the last 8760 hours. CBG: Recent Labs  Lab 09/01/23 1646 09/01/23 1722 09/01/23 1933 09/01/23 1959 09/02/23 0731  GLUCAP 113* 109* 184* 198* 191*   D-Dimer: No results for input(s): "DDIMER" in the last 72 hours. Hgb A1c: No results for input(s): "HGBA1C" in the last 72 hours. Lipid Profile: No results for input(s): "CHOL", "HDL", "LDLCALC", "TRIG", "CHOLHDL", "LDLDIRECT" in the last 72 hours. Thyroid function studies: No results for input(s): "TSH", "T4TOTAL", "T3FREE", "THYROIDAB" in the last 72 hours.  Invalid input(s): "FREET3" Anemia work up: No results for input(s): "VITAMINB12", "FOLATE", "FERRITIN", "TIBC", "IRON", "RETICCTPCT" in the last 72 hours. Sepsis Labs: Recent Labs  Lab 09/01/23 0651 09/01/23 0838 09/01/23 1928 09/01/23 2130 09/01/23 2311 09/02/23 0436 09/03/23 0430  WBC 12.0*  --   --   --   --  14.6* 10.8*  LATICACIDVEN  --  1.5 2.4* 1.6 1.2  --   --     Microbiology Recent Results (from the past 240 hours)  Culture, blood (routine x 2)     Status: Abnormal   Collection Time: 09/01/23  6:51 AM   Specimen: BLOOD  Result Value Ref Range Status   Specimen Description   Final    BLOOD BLOOD RIGHT ARM Performed at [facility], [address], [place], [place] [address]    Special Requests   Final    BOTTLES DRAWN AEROBIC AND ANAEROBIC Blood Culture adequate volume Performed at [facility], [address] [place], [place] [address]    Culture  Setup Time   Final    GRAM  NEGATIVE RODS AEROBIC BOTTLE ONLY CRITICAL VALUE NOTED.  VALUE IS CONSISTENT WITH PREVIOUSLY REPORTED AND CALLED VALUE. Performed at [facility], [address] [place], [place] [address]    Culture (A)  Final    ESCHERICHIA COLI SUSCEPTIBILITIES PERFORMED ON PREVIOUS CULTURE WITHIN THE LAST 5 DAYS. Performed at [facility], [address] [place], [place] [address]    Report Status 09/04/2023 FINAL  Final  Culture, blood (routine x 2)     Status: Abnormal   Collection Time: 09/01/23  6:51 AM   Specimen: BLOOD  Result Value Ref Range Status   Specimen Description   Final    BLOOD BLOOD LEFT HAND Performed at [facility] [place] [facility], [address] [place], [place] [address]    Special Requests   Final    BOTTLES DRAWN AEROBIC AND ANAEROBIC Blood Culture adequate volume Performed at [facility], [address], [place], [place] [address]    Culture  Setup Time   Final    GRAM NEGATIVE RODS IN BOTH AEROBIC AND ANAEROBIC BOTTLES CRITICAL RESULT CALLED TO, READ BACK BY AND VERIFIED WITH: WILL ANDERSON, PharMD @2210  09/01/2023 COP Performed at [facility], [address] [place], [place] [address]    Culture ESCHERICHIA COLI (A)  Final   Report Status 09/04/2023 FINAL  Final   Organism ID, Bacteria ESCHERICHIA COLI  Final   Organism ID, Bacteria ESCHERICHIA COLI  Final  Susceptibility   Escherichia coli - KIRBY BAUER*    CEFAZOLIN  INTERMEDIATE Intermediate    Escherichia coli - MIC*    AMPICILLIN >=32 RESISTANT Resistant     CEFEPIME <=0.12 SENSITIVE Sensitive     CEFTAZIDIME <=1 SENSITIVE Sensitive     CEFTRIAXONE  <=0.25 SENSITIVE Sensitive     CIPROFLOXACIN <=0.25 SENSITIVE Sensitive     GENTAMICIN  <=1 SENSITIVE Sensitive     IMIPENEM <=0.25 SENSITIVE Sensitive     TRIMETH/SULFA <=20 SENSITIVE Sensitive     AMPICILLIN/SULBACTAM 16 INTERMEDIATE Intermediate     PIP/TAZO <=4 SENSITIVE Sensitive ug/mL    * ESCHERICHIA COLI     ESCHERICHIA COLI  Resp panel by RT-PCR (RSV, Flu A&B, Covid) Anterior Nasal Swab     Status: None   Collection Time: 09/01/23  6:51 AM   Specimen: Anterior Nasal Swab  Result Value Ref Range Status   SARS Coronavirus 2 by RT PCR NEGATIVE NEGATIVE Final    Comment: (NOTE) SARS-CoV-2 target nucleic acids are NOT DETECTED.  The SARS-CoV-2 RNA is generally detectable in upper respiratory specimens during the acute phase of infection. The lowest concentration of SARS-CoV-2 viral copies this assay can detect is 138 copies/mL. A negative result does not preclude SARS-Cov-2 infection and should not be used as the sole basis for treatment or other patient management decisions. A negative result may occur with  improper specimen collection/handling, submission of specimen other than nasopharyngeal swab, presence of viral mutation(s) within the areas targeted by this assay, and inadequate number of viral copies(<138 copies/mL). A negative result must be combined with clinical observations, patient history, and epidemiological information. The expected result is Negative.  Fact Sheet for Patients:  BloggerCourse.com  Fact Sheet for Healthcare Providers:  SeriousBroker.it  This test is no t yet approved or cleared by the [place]  FDA and  has been authorized for detection and/or diagnosis of SARS-CoV-2 by FDA under an Emergency Use Authorization (EUA). This EUA will remain  in effect (meaning this test can be used) for the duration of the COVID-19 declaration under Section 564(b)(1) of the Act, 21 U.S.C.section 360bbb-3(b)(1), unless the authorization is terminated  or revoked sooner.       Influenza A by PCR NEGATIVE NEGATIVE Final   Influenza B by PCR NEGATIVE NEGATIVE Final    Comment: (NOTE) The Xpert Xpress SARS-CoV-2/FLU/RSV plus assay is intended as an aid in the diagnosis of influenza from Nasopharyngeal swab specimens  and should not be used as a sole basis for treatment. Nasal washings and aspirates are unacceptable for Xpert Xpress SARS-CoV-2/FLU/RSV testing.  Fact Sheet for Patients: BloggerCourse.com  Fact Sheet for Healthcare Providers: SeriousBroker.it  This test is not yet approved or cleared by the [place]  FDA and has been authorized for detection and/or diagnosis of SARS-CoV-2 by FDA under an Emergency Use Authorization (EUA). This EUA will remain in effect (meaning this test can be used) for the duration of the COVID-19 declaration under Section 564(b)(1) of the Act, 21 U.S.C. section 360bbb-3(b)(1), unless the authorization is terminated or revoked.     Resp Syncytial Virus by PCR NEGATIVE NEGATIVE Final    Comment: (NOTE) Fact Sheet for Patients: BloggerCourse.com  Fact Sheet for Healthcare Providers: SeriousBroker.it  This test is not yet approved or cleared by the [place]  FDA and has been authorized for detection and/or diagnosis of SARS-CoV-2 by FDA under an Emergency Use Authorization (EUA). This EUA will remain in effect (meaning this test can be used) for the duration of the COVID-19  declaration under Section 564(b)(1) of the Act, 21 U.S.C. section 360bbb-3(b)(1), unless the authorization is terminated or revoked.  Performed at [facility], [address] [place], [place] [address]   Blood Culture ID Panel (Reflexed)     Status: Abnormal   Collection Time: 09/01/23  6:51 AM  Result Value Ref Range Status   Enterococcus faecalis NOT DETECTED NOT DETECTED Final   Enterococcus Faecium NOT DETECTED NOT DETECTED Final   Listeria monocytogenes NOT DETECTED NOT DETECTED Final   Staphylococcus species NOT DETECTED NOT DETECTED Final   Staphylococcus aureus (BCID) NOT DETECTED NOT DETECTED Final   Staphylococcus epidermidis NOT DETECTED NOT DETECTED Final    Staphylococcus lugdunensis NOT DETECTED NOT DETECTED Final   Streptococcus species NOT DETECTED NOT DETECTED Final   Streptococcus agalactiae NOT DETECTED NOT DETECTED Final   Streptococcus pneumoniae NOT DETECTED NOT DETECTED Final   Streptococcus pyogenes NOT DETECTED NOT DETECTED Final   A.calcoaceticus-baumannii NOT DETECTED NOT DETECTED Final   Bacteroides fragilis NOT DETECTED NOT DETECTED Final   Enterobacterales DETECTED (A) NOT DETECTED Final    Comment: Enterobacterales represent a large order of gram negative bacteria, not a single organism. CRITICAL RESULT CALLED TO, READ BACK BY AND VERIFIED WITH: WILL ANDERSON, PharmMD @2210  09/01/2023 COP    Enterobacter cloacae complex NOT DETECTED NOT DETECTED Final   Escherichia coli DETECTED (A) NOT DETECTED Final    Comment: CRITICAL RESULT CALLED TO, READ BACK BY AND VERIFIED WITH: WILL ANDERSON, PharmMD @2210  09/01/2023 COP    Klebsiella aerogenes NOT DETECTED NOT DETECTED Final   Klebsiella oxytoca NOT DETECTED NOT DETECTED Final   Klebsiella pneumoniae NOT DETECTED NOT DETECTED Final   Proteus species NOT DETECTED NOT DETECTED Final   Salmonella species NOT DETECTED NOT DETECTED Final   Serratia marcescens NOT DETECTED NOT DETECTED Final   Haemophilus influenzae NOT DETECTED NOT DETECTED Final   Neisseria meningitidis NOT DETECTED NOT DETECTED Final   Pseudomonas aeruginosa NOT DETECTED NOT DETECTED Final   Stenotrophomonas maltophilia NOT DETECTED NOT DETECTED Final   Candida albicans NOT DETECTED NOT DETECTED Final   Candida auris NOT DETECTED NOT DETECTED Final   Candida glabrata NOT DETECTED NOT DETECTED Final   Candida krusei NOT DETECTED NOT DETECTED Final   Candida parapsilosis NOT DETECTED NOT DETECTED Final   Candida tropicalis NOT DETECTED NOT DETECTED Final   Cryptococcus neoformans/gattii NOT DETECTED NOT DETECTED Final   CTX-M ESBL NOT DETECTED NOT DETECTED Final   Carbapenem resistance IMP NOT DETECTED NOT  DETECTED Final   Carbapenem resistance KPC NOT DETECTED NOT DETECTED Final   Carbapenem resistance NDM NOT DETECTED NOT DETECTED Final   Carbapenem resist OXA 48 LIKE NOT DETECTED NOT DETECTED Final   Carbapenem resistance VIM NOT DETECTED NOT DETECTED Final    Comment: Performed at [facility], [address] [place], [place] [address]  Urine Culture     Status: Abnormal   Collection Time: 09/01/23  9:07 AM   Specimen: Urine, Random  Result Value Ref Range Status   Specimen Description   Final    URINE, RANDOM Performed at [facility], [address], [place], [place] [address]    Special Requests   Final    NONE Reflexed from [id number] Performed at [facility], [address] [place], [place] [address]    Culture >=100,000 COLONIES/mL ESCHERICHIA COLI (A)  Final   Report Status 09/03/2023 FINAL  Final   Organism ID, Bacteria ESCHERICHIA COLI (A)  Final  Susceptibility   Escherichia coli - MIC*    AMPICILLIN >=32 RESISTANT Resistant     CEFAZOLIN  <=4 SENSITIVE Sensitive     CEFEPIME <=0.12 SENSITIVE Sensitive     CEFTRIAXONE  <=0.25 SENSITIVE Sensitive     CIPROFLOXACIN <=0.25 SENSITIVE Sensitive     GENTAMICIN  <=1 SENSITIVE Sensitive     IMIPENEM <=0.25 SENSITIVE Sensitive     NITROFURANTOIN <=16 SENSITIVE Sensitive     TRIMETH/SULFA <=20 SENSITIVE Sensitive     AMPICILLIN/SULBACTAM 16 INTERMEDIATE Intermediate     PIP/TAZO <=4 SENSITIVE Sensitive ug/mL    * >=100,000 COLONIES/mL ESCHERICHIA COLI  MRSA Next Gen by PCR, Nasal     Status: None   Collection Time: 09/01/23  5:13 PM   Specimen: Nasal Mucosa; Nasal Swab  Result Value Ref Range Status   MRSA by PCR Next Gen NOT DETECTED NOT DETECTED Final    Comment: (NOTE) The GeneXpert MRSA Assay (FDA approved for NASAL specimens only), is one component of a comprehensive MRSA colonization surveillance program. It is not intended to diagnose MRSA infection nor to guide or monitor treatment  for MRSA infections. Test performance is not FDA approved in patients less than 81 years old. Performed at [facility], [address] [place], [place] [address]     Procedures and diagnostic studies:  No results found.             LOS: 3 days   Hamilton Marinello  [facility]   Pager on [email or web address]. If 7PM-7AM, please contact night-coverage at [email or web address]     09/04/2023, 1:05 PM

## 2023-09-04 NOTE — Plan of Care (Signed)
  Problem: Education: Goal: Individualized Educational Video(s) Outcome: Not Progressing   Problem: Fluid Volume: Goal: Ability to maintain a balanced intake and output will improve Outcome: Not Progressing   Problem: Nutritional: Goal: Maintenance of adequate nutrition will improve Outcome: Not Progressing   Problem: Tissue Perfusion: Goal: Adequacy of tissue perfusion will improve Outcome: Not Progressing

## 2023-09-04 NOTE — IPAL (Signed)
  Interdisciplinary Goals of Care Family Meeting   Date carried out: 09/04/2023  Location of the meeting: Bedside  Member's involved: Physician and Family Member or next of kin [name], son)  Durable Power of Insurance risk surveyor: [name], son  Discussion: We discussed diagnoses, goals of care and prognosis for Emma Guzman .  Patient confirmed her desire to transition to hospice and requested comfort care.  Code status:   Code Status: Do not attempt resuscitation (DNR) - Comfort care   Disposition: In-patient comfort care  Time spent for the meeting: 10 minutes    Sheril Dines, MD  09/04/2023, 10:29 AM

## 2023-09-04 NOTE — Plan of Care (Signed)
  Problem: Education: Goal: Ability to describe self-care measures that may prevent or decrease complications (Diabetes Survival Skills Education) will improve Outcome: Progressing Goal: Individualized Educational Video(s) Outcome: Progressing   Problem: Coping: Goal: Ability to adjust to condition or change in health will improve Outcome: Progressing   Problem: Fluid Volume: Goal: Ability to maintain a balanced intake and output will improve Outcome: Progressing   Problem: Health Behavior/Discharge Planning: Goal: Ability to identify and utilize available resources and services will improve Outcome: Progressing Goal: Ability to manage health-related needs will improve Outcome: Progressing   Problem: Metabolic: Goal: Ability to maintain appropriate glucose levels will improve Outcome: Progressing   Problem: Skin Integrity: Goal: Risk for impaired skin integrity will decrease Outcome: Progressing   Problem: Tissue Perfusion: Goal: Adequacy of tissue perfusion will improve Outcome: Progressing   Problem: Clinical Measurements: Goal: Ability to maintain clinical measurements within normal limits will improve Outcome: Progressing Goal: Will remain free from infection Outcome: Progressing Goal: Diagnostic test results will improve Outcome: Progressing Goal: Respiratory complications will improve Outcome: Progressing Goal: Cardiovascular complication will be avoided Outcome: Progressing   Problem: Activity: Goal: Risk for activity intolerance will decrease Outcome: Progressing

## 2023-09-04 NOTE — Progress Notes (Addendum)
 [facility] Liaison  Attending physician informed TOC and HL that patient's condition has declined recently.  HL met with patient and son, [name], at the bedside.    Will monitor patient's condition throughout the day and re-evaluate tomorrow morning for the Hospice Home.  Attending and TOC are aware.  Please call with any Hospice related questions or concerns.    [facility] Liaison [phone]

## 2023-09-04 NOTE — NC FL2 (Signed)
 [place]  MEDICAID [id number] LEVEL OF CARE FORM     IDENTIFICATION  Patient Name: Emma Guzman Birthdate: 04/29/1945 Sex: female Admission Date (Current Location): 09/01/2023  [facility] and [place] Number:  Chiropodist and Address:  [facility], [address] [place], [place] [address]      Provider Number: [id number]  Attending Physician Name and Address:  Sheril Dines, MD  Relative Name and Phone Number:  Cornelius Schuitema, son, phone; [phone]    Current Level of Care: Hospital Recommended Level of Care: Skilled Nursing Facility Prior Approval Number:    Date Approved/Denied: 10/25/22 PASRR Number: [id number]  Discharge Plan: SNF    Current Diagnoses: Patient Active Problem List   Diagnosis Date Noted   AMS (altered mental status) 09/01/2023   Sepsis (HCC) 09/01/2023   Hypertensive urgency 08/03/2023   Type 2 diabetes mellitus without complications (HCC) 08/03/2023   GERD without esophagitis 08/03/2023   Depression 08/03/2023   Rhabdomyolysis 08/02/2023   DKA (diabetic ketoacidosis) (HCC) 10/24/2022   Diabetes mellitus without complication (HCC) 10/24/2022   Elevated CK 10/24/2022   UTI (urinary tract infection) 10/24/2022   Elevated lactic acid level 10/24/2022   Fall at home, initial encounter 10/24/2022   Chronic systolic CHF (congestive heart failure) (HCC) 10/24/2022   Hypertension    Hypercholesteremia    Apnea, sleep 04/07/2014   Cardiac defibrillator in place 04/07/2014   Cardiomyopathy (HCC) 04/07/2014   Acid reflux 04/07/2014   Morbid obesity (HCC) 05/12/2008   CARDIOMYOPATHY, PRIMARY, DILATED 05/12/2008   LBBB 05/12/2008   SYSTOLIC HEART FAILURE, ACUTE ON CHRONIC 05/12/2008   Implantable cardioverter-defibrillator (ICD) in situ 05/12/2008   Automatic implantable cardioverter-defibrillator in situ 05/12/2008   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   Block, bundle branch, left 05/12/2008    Orientation  RESPIRATION BLADDER Height & Weight     Self, Situation, Place  Normal Incontinent Weight: 54.5 kg Height:  5\' 1"  (154.9 cm)  BEHAVIORAL SYMPTOMS/MOOD NEUROLOGICAL BOWEL NUTRITION STATUS      Incontinent Diet (Please see discharge summary)  AMBULATORY STATUS COMMUNICATION OF NEEDS Skin   Limited Assist Verbally Bruising (Ecchymosis, sacrum, non-tenting)                       Personal Care Assistance Level of Assistance  Bathing, Feeding, Dressing, Total care Bathing Assistance: Limited assistance Feeding assistance: Limited assistance Dressing Assistance: Limited assistance Total Care Assistance: Limited assistance   Functional Limitations Info             SPECIAL CARE FACTORS FREQUENCY                       Contractures      Additional Factors Info  Code Status, Allergies Code Status Info: DNR Allergies Info: Metformin  High - Diarrhea  Pravastatin  High - Other (See Comments)           Current Medications (09/04/2023):  This is the current hospital active medication list Current Facility-Administered Medications  Medication Dose Route Frequency Provider Last Rate Last Admin   acetaminophen  (TYLENOL ) tablet 500 mg  500 mg Oral Q6H PRN Greenwood, Howard F, RPH   500 mg at 09/03/23 1118   cefTRIAXone  (ROCEPHIN ) 2 g in sodium chloride  0.9 % 100 mL IVPB  2 g Intravenous Q24H Gideon Kussmaul, NP   Stopped at 09/04/23 0909   clonazePAM (KLONOPIN) tablet 0.5 mg  0.5 mg Oral BID PRN Tiajuana Fluke, MD  nystatin (MYCOSTATIN) 100000 UNIT/ML suspension 500,000 Units  5 mL Oral QID Nelson, Dana G, NP   500,000 Units at 09/04/23 1610   ondansetron  (ZOFRAN ) injection 4 mg  4 mg Intravenous Q6H PRN Antoniette Batty T, MD   4 mg at 09/04/23 9604   oxyCODONE (Oxy IR/ROXICODONE) immediate release tablet 5-10 mg  5-10 mg Oral Q4H PRN Sreenath, Sudheer B, MD   5 mg at 09/04/23 0900   polyethylene glycol (MIRALAX  / GLYCOLAX ) packet 17 g  17 g Oral Daily PRN  Sreenath, Sudheer B, MD         Discharge Medications: Please see discharge summary for a list of discharge medications.  Relevant Imaging Results:  Relevant Lab Results:   Additional Information SSN: [id number]  Crayton Docker, RN

## 2023-09-05 DIAGNOSIS — A419 Sepsis, unspecified organism: Secondary | ICD-10-CM | POA: Diagnosis not present

## 2023-09-05 DIAGNOSIS — R6521 Severe sepsis with septic shock: Secondary | ICD-10-CM | POA: Diagnosis not present

## 2023-09-05 NOTE — Progress Notes (Signed)
 Progress Note    Emma Guzman  JXB:[id number] DOB: [birth year]  DOA: 09/01/2023 PCP: Alexander Anes, MD      Brief Narrative:    Medical records reviewed and are as summarized below:  Emma Guzman is a 78 y.o. female with past medical history of HFrEF status post ICD, CKD, diabetes with previous DKA, and hypertension, peripheral neuropathy, recent hospitalization about a month prior to admission for fall and rhabdomyolysis, who presented to the hospital with general weakness, word finding difficulty and altered mental status.  She was hypotensive in the ED.  She was started on IV fluids and antibiotics for sepsis and UTI and was admitted to the medicine service.  However, despite IV fluids, patient became more hypotensive.  Intensivist was consulted and patient was transferred to the ICU for further management.  She was treated with IV vasopressors.       Assessment/Plan:   Principal Problem:   Septic shock (HCC) Active Problems:   E. coli UTI   E coli bacteremia   AMS (altered mental status)    Body mass index is 22.7 kg/m.   Septic shock secondary to acute E. coli UTI and bacteremia Acute toxic metabolic encephalopathy History of HFrEF s/p ICD but now with recovered EF (2D echo on 09/02/2023 showed EF estimated at 50 to 55%). Insulin -dependent diabetes mellitus Nausea and vomiting   PLAN   S/p treatment with 4 days of IV ceftriaxone  (completed on 09/04/2023). She was also treated with IV fluids and vasopressors. Continue comfort care Case discussed with Fredrik Jensen, hospice liaison.  At the moment, patient is not eligible for comfort care at the hospice home.  He will continue to follow. Plan discussed with [name], son, at the bedside    Diet Order             Diet regular Room service appropriate? Yes with Assist; Fluid consistency: Thin  Diet effective now                             Consultants: Intensivists Cardiologist  Procedures: None    Medications:    nystatin  5 mL Oral QID   Continuous Infusions:     Anti-infectives (From admission, onward)    Start     Dose/Rate Route Frequency Ordered Stop   09/02/23 1000  cefTRIAXone  (ROCEPHIN ) 1 g in sodium chloride  0.9 % 100 mL IVPB  Status:  Discontinued        1 g 200 mL/hr over 30 Minutes Intravenous Every 24 hours 09/01/23 1036 09/01/23 1830   09/02/23 0600  cefTRIAXone  (ROCEPHIN ) 2 g in sodium chloride  0.9 % 100 mL IVPB  Status:  Discontinued        2 g 200 mL/hr over 30 Minutes Intravenous Every 24 hours 09/01/23 2227 09/04/23 1330   09/01/23 2000  piperacillin-tazobactam (ZOSYN) IVPB 3.375 g  Status:  Discontinued        3.375 g 12.5 mL/hr over 240 Minutes Intravenous Every 8 hours 09/01/23 1830 09/01/23 2227   09/01/23 1015  cefTRIAXone  (ROCEPHIN ) 1 g in sodium chloride  0.9 % 100 mL IVPB        1 g 200 mL/hr over 30 Minutes Intravenous STAT 09/01/23 1004 09/01/23 1123              Family Communication/Anticipated D/C date and plan/Code Status   DVT prophylaxis:      Code Status: Do not attempt resuscitation (DNR) -  Comfort care  Family Communication: Plan discussed with with Kevin, son, at the bedside Disposition Plan: Plan to discharge to hospice house   Status is: Inpatient Remains inpatient appropriate because: Comfort care       Subjective:   Interval events noted.  She said she feels comfortable.  She says she only has pain in her knees and ankles when she turns around in bed.  [name], son, was at the bedside.  Fredrik Jensen, hospice liaison, walked into the room during this encounter.  Objective:    Vitals:   09/04/23 0419 09/04/23 0836 09/04/23 2002 09/05/23 0826  BP: (!) 151/85 (!) 137/92 114/80 123/75  Pulse: 87 87 97 (!) 102  Resp:  16 18 15   Temp: 99.9 F (37.7 C) 97.6 F (36.4 C) 97.9 F (36.6 C) 98.1 F (36.7 C)  TempSrc:      SpO2:  98% 98% 97% 96%  Weight:      Height:       No data found.   Intake/Output Summary (Last 24 hours) at 09/05/2023 1316 Last data filed at 09/04/2023 1900 Gross per 24 hour  Intake 0 ml  Output --  Net 0 ml   Filed Weights   09/01/23 0641 09/01/23 1700  Weight: 54.3 kg 54.5 kg    Exam:  GEN: NAD SKIN: Warm and dry EYES: No pallor or icterus ENT: MMM CV: RRR PULM: CTA B ABD: soft, ND, NT, +BS CNS: AAO x person and place, non focal EXT: No edema or tenderness      Data Reviewed:   I have personally reviewed following labs and imaging studies:  Labs: Labs show the following:   Basic Metabolic Panel: Recent Labs  Lab 09/01/23 0651 09/01/23 0849 09/01/23 1750 09/01/23 1928 09/02/23 0436 09/03/23 0430  NA 138  --  138 138 139 136  K 4.2  --  2.7* 3.2* 3.2* 3.0*  CL 103  --  105 105 104 102  CO2 27  --  21* 20* 23 24  GLUCOSE 208*  --  117* 173* 243* 234*  BUN 34*  --  33* 35* 43* 40*  CREATININE 1.18*  --  1.15* 1.11* 1.14* 0.80  CALCIUM 9.4  --  9.1 9.3 9.4 9.6  MG  --  1.3*  --   --   --  1.7  PHOS  --  4.0  --   --   --   --    GFR Estimated Creatinine Clearance: 43.7 mL/min (by C-G formula based on SCr of 0.8 mg/dL). Liver Function Tests: Recent Labs  Lab 09/01/23 0651  AST 19  ALT 19  ALKPHOS 64  BILITOT 0.9  PROT 6.9  ALBUMIN 3.2*   No results for input(s): "LIPASE", "AMYLASE" in the last 168 hours. No results for input(s): "AMMONIA" in the last 168 hours. Coagulation profile No results for input(s): "INR", "PROTIME" in the last 168 hours.  CBC: Recent Labs  Lab 09/01/23 0651 09/02/23 0436 09/03/23 0430  WBC 12.0* 14.6* 10.8*  NEUTROABS 10.2*  --   --   HGB 11.3* 10.1* 10.3*  HCT 33.4* 31.0* 30.3*  MCV 93.0 92.5 90.7  PLT 137* 133* 142*   Cardiac Enzymes: Recent Labs  Lab 09/01/23 0651  CKTOTAL 151   BNP (last 3 results) No results for input(s): "PROBNP" in the last 8760 hours. CBG: Recent Labs  Lab 09/01/23 1646  09/01/23 1722 09/01/23 1933 09/01/23 1959 09/02/23 0731  GLUCAP 113* 109* 184* 198* 191*   D-Dimer:  No results for input(s): "DDIMER" in the last 72 hours. Hgb A1c: No results for input(s): "HGBA1C" in the last 72 hours. Lipid Profile: No results for input(s): "CHOL", "HDL", "LDLCALC", "TRIG", "CHOLHDL", "LDLDIRECT" in the last 72 hours. Thyroid function studies: No results for input(s): "TSH", "T4TOTAL", "T3FREE", "THYROIDAB" in the last 72 hours.  Invalid input(s): "FREET3" Anemia work up: No results for input(s): "VITAMINB12", "FOLATE", "FERRITIN", "TIBC", "IRON", "RETICCTPCT" in the last 72 hours. Sepsis Labs: Recent Labs  Lab 09/01/23 0651 09/01/23 0838 09/01/23 1928 09/01/23 2130 09/01/23 2311 09/02/23 0436 09/03/23 0430  WBC 12.0*  --   --   --   --  14.6* 10.8*  LATICACIDVEN  --  1.5 2.4* 1.6 1.2  --   --     Microbiology Recent Results (from the past 240 hours)  Culture, blood (routine x 2)     Status: Abnormal   Collection Time: 09/01/23  6:51 AM   Specimen: BLOOD  Result Value Ref Range Status   Specimen Description   Final    BLOOD BLOOD RIGHT ARM Performed at [facility], [address], [place], [place] [address]    Special Requests   Final    BOTTLES DRAWN AEROBIC AND ANAEROBIC Blood Culture adequate volume Performed at [facility], [address] [place], [place] [address]    Culture  Setup Time   Final    GRAM NEGATIVE RODS AEROBIC BOTTLE ONLY CRITICAL VALUE NOTED.  VALUE IS CONSISTENT WITH PREVIOUSLY REPORTED AND CALLED VALUE. Performed at [facility], [address] [place], [place] [address]    Culture (A)  Final    ESCHERICHIA COLI SUSCEPTIBILITIES PERFORMED ON PREVIOUS CULTURE WITHIN THE LAST 5 DAYS. Performed at [facility], [address], [place], [place] [address]    Report Status 09/04/2023 FINAL  Final  Culture, blood (routine x 2)     Status: Abnormal   Collection Time: 09/01/23  6:51 AM    Specimen: BLOOD  Result Value Ref Range Status   Specimen Description   Final    BLOOD BLOOD LEFT HAND Performed at [facility], [address] [place], [place] [address]    Special Requests   Final    BOTTLES DRAWN AEROBIC AND ANAEROBIC Blood Culture adequate volume Performed at [facility], [address], [place], [place] [address]    Culture  Setup Time   Final    GRAM NEGATIVE RODS IN BOTH AEROBIC AND ANAEROBIC BOTTLES CRITICAL RESULT CALLED TO, READ BACK BY AND VERIFIED WITH: WILL ANDERSON, PharMD @2210  09/01/2023 COP Performed at [facility], [address], [place], [place] [address]    Culture ESCHERICHIA COLI (A)  Final   Report Status 09/04/2023 FINAL  Final   Organism ID, Bacteria ESCHERICHIA COLI  Final   Organism ID, Bacteria ESCHERICHIA COLI  Final      Susceptibility   Escherichia coli - KIRBY BAUER*    CEFAZOLIN  INTERMEDIATE Intermediate    Escherichia coli - MIC*    AMPICILLIN >=32 RESISTANT Resistant     CEFEPIME <=0.12 SENSITIVE Sensitive     CEFTAZIDIME <=1 SENSITIVE Sensitive     CEFTRIAXONE  <=0.25 SENSITIVE Sensitive     CIPROFLOXACIN <=0.25 SENSITIVE Sensitive     GENTAMICIN  <=1 SENSITIVE Sensitive     IMIPENEM <=0.25 SENSITIVE Sensitive     TRIMETH/SULFA <=20 SENSITIVE Sensitive     AMPICILLIN/SULBACTAM 16 INTERMEDIATE Intermediate     PIP/TAZO <=4 SENSITIVE Sensitive ug/mL    *  ESCHERICHIA COLI    ESCHERICHIA COLI  Resp panel by RT-PCR (RSV, Flu A&B, Covid) Anterior Nasal Swab     Status: None   Collection Time: 09/01/23  6:51 AM   Specimen: Anterior Nasal Swab  Result Value Ref Range Status   SARS Coronavirus 2 by RT PCR NEGATIVE NEGATIVE Final    Comment: (NOTE) SARS-CoV-2 target nucleic acids are NOT DETECTED.  The SARS-CoV-2 RNA is generally detectable in upper respiratory specimens during the acute phase of infection. The lowest concentration of SARS-CoV-2 viral copies this assay can detect is 138  copies/mL. A negative result does not preclude SARS-Cov-2 infection and should not be used as the sole basis for treatment or other patient management decisions. A negative result may occur with  improper specimen collection/handling, submission of specimen other than nasopharyngeal swab, presence of viral mutation(s) within the areas targeted by this assay, and inadequate number of viral copies(<138 copies/mL). A negative result must be combined with clinical observations, patient history, and epidemiological information. The expected result is Negative.  Fact Sheet for Patients:  BloggerCourse.com  Fact Sheet for Healthcare Providers:  SeriousBroker.it  This test is no t yet approved or cleared by the [place]  FDA and  has been authorized for detection and/or diagnosis of SARS-CoV-2 by FDA under an Emergency Use Authorization (EUA). This EUA will remain  in effect (meaning this test can be used) for the duration of the COVID-19 declaration under Section 564(b)(1) of the Act, 21 U.S.C.section 360bbb-3(b)(1), unless the authorization is terminated  or revoked sooner.       Influenza A by PCR NEGATIVE NEGATIVE Final   Influenza B by PCR NEGATIVE NEGATIVE Final    Comment: (NOTE) The Xpert Xpress SARS-CoV-2/FLU/RSV plus assay is intended as an aid in the diagnosis of influenza from Nasopharyngeal swab specimens and should not be used as a sole basis for treatment. Nasal washings and aspirates are unacceptable for Xpert Xpress SARS-CoV-2/FLU/RSV testing.  Fact Sheet for Patients: BloggerCourse.com  Fact Sheet for Healthcare Providers: SeriousBroker.it  This test is not yet approved or cleared by the [place]  FDA and has been authorized for detection and/or diagnosis of SARS-CoV-2 by FDA under an Emergency Use Authorization (EUA). This EUA will remain in effect (meaning  this test can be used) for the duration of the COVID-19 declaration under Section 564(b)(1) of the Act, 21 U.S.C. section 360bbb-3(b)(1), unless the authorization is terminated or revoked.     Resp Syncytial Virus by PCR NEGATIVE NEGATIVE Final    Comment: (NOTE) Fact Sheet for Patients: BloggerCourse.com  Fact Sheet for Healthcare Providers: SeriousBroker.it  This test is not yet approved or cleared by the [place]  FDA and has been authorized for detection and/or diagnosis of SARS-CoV-2 by FDA under an Emergency Use Authorization (EUA). This EUA will remain in effect (meaning this test can be used) for the duration of the COVID-19 declaration under Section 564(b)(1) of the Act, 21 U.S.C. section 360bbb-3(b)(1), unless the authorization is terminated or revoked.  Performed at [facility], [address] [place], [place] [address]   Blood Culture ID Panel (Reflexed)     Status: Abnormal   Collection Time: 09/01/23  6:51 AM  Result Value Ref Range Status   Enterococcus faecalis NOT DETECTED NOT DETECTED Final   Enterococcus Faecium NOT DETECTED NOT DETECTED Final   Listeria monocytogenes NOT DETECTED NOT DETECTED Final   Staphylococcus species NOT DETECTED NOT DETECTED Final   Staphylococcus aureus (BCID) NOT DETECTED NOT DETECTED Final  Staphylococcus epidermidis NOT DETECTED NOT DETECTED Final   Staphylococcus lugdunensis NOT DETECTED NOT DETECTED Final   Streptococcus species NOT DETECTED NOT DETECTED Final   Streptococcus agalactiae NOT DETECTED NOT DETECTED Final   Streptococcus pneumoniae NOT DETECTED NOT DETECTED Final   Streptococcus pyogenes NOT DETECTED NOT DETECTED Final   A.calcoaceticus-baumannii NOT DETECTED NOT DETECTED Final   Bacteroides fragilis NOT DETECTED NOT DETECTED Final   Enterobacterales DETECTED (A) NOT DETECTED Final    Comment: Enterobacterales represent a large order of gram  negative bacteria, not a single organism. CRITICAL RESULT CALLED TO, READ BACK BY AND VERIFIED WITH: WILL ANDERSON, PharmMD @2210  09/01/2023 COP    Enterobacter cloacae complex NOT DETECTED NOT DETECTED Final   Escherichia coli DETECTED (A) NOT DETECTED Final    Comment: CRITICAL RESULT CALLED TO, READ BACK BY AND VERIFIED WITH: WILL ANDERSON, PharmMD @2210  09/01/2023 COP    Klebsiella aerogenes NOT DETECTED NOT DETECTED Final   Klebsiella oxytoca NOT DETECTED NOT DETECTED Final   Klebsiella pneumoniae NOT DETECTED NOT DETECTED Final   Proteus species NOT DETECTED NOT DETECTED Final   Salmonella species NOT DETECTED NOT DETECTED Final   Serratia marcescens NOT DETECTED NOT DETECTED Final   Haemophilus influenzae NOT DETECTED NOT DETECTED Final   Neisseria meningitidis NOT DETECTED NOT DETECTED Final   Pseudomonas aeruginosa NOT DETECTED NOT DETECTED Final   Stenotrophomonas maltophilia NOT DETECTED NOT DETECTED Final   Candida albicans NOT DETECTED NOT DETECTED Final   Candida auris NOT DETECTED NOT DETECTED Final   Candida glabrata NOT DETECTED NOT DETECTED Final   Candida krusei NOT DETECTED NOT DETECTED Final   Candida parapsilosis NOT DETECTED NOT DETECTED Final   Candida tropicalis NOT DETECTED NOT DETECTED Final   Cryptococcus neoformans/gattii NOT DETECTED NOT DETECTED Final   CTX-M ESBL NOT DETECTED NOT DETECTED Final   Carbapenem resistance IMP NOT DETECTED NOT DETECTED Final   Carbapenem resistance KPC NOT DETECTED NOT DETECTED Final   Carbapenem resistance NDM NOT DETECTED NOT DETECTED Final   Carbapenem resist OXA 48 LIKE NOT DETECTED NOT DETECTED Final   Carbapenem resistance VIM NOT DETECTED NOT DETECTED Final    Comment: Performed at [facility], [address] [place], [place] [address]  Urine Culture     Status: Abnormal   Collection Time: 09/01/23  9:07 AM   Specimen: Urine, Random  Result Value Ref Range Status   Specimen Description   Final     URINE, RANDOM Performed at [facility], [address] [place], [place] [address]    Special Requests   Final    NONE Reflexed from [id number] Performed at [facility], [address] [place], [place] [address]    Culture >=100,000 COLONIES/mL ESCHERICHIA COLI (A)  Final   Report Status 09/03/2023 FINAL  Final   Organism ID, Bacteria ESCHERICHIA COLI (A)  Final      Susceptibility   Escherichia coli - MIC*    AMPICILLIN >=32 RESISTANT Resistant     CEFAZOLIN  <=4 SENSITIVE Sensitive     CEFEPIME <=0.12 SENSITIVE Sensitive     CEFTRIAXONE  <=0.25 SENSITIVE Sensitive     CIPROFLOXACIN <=0.25 SENSITIVE Sensitive     GENTAMICIN  <=1 SENSITIVE Sensitive     IMIPENEM <=0.25 SENSITIVE Sensitive     NITROFURANTOIN <=16 SENSITIVE Sensitive     TRIMETH/SULFA <=20 SENSITIVE Sensitive     AMPICILLIN/SULBACTAM 16 INTERMEDIATE Intermediate     PIP/TAZO <=4 SENSITIVE Sensitive ug/mL    * >=100,000 COLONIES/mL ESCHERICHIA COLI  MRSA Next Gen by PCR, Nasal     Status: None   Collection Time: 09/01/23  5:13 PM   Specimen: Nasal Mucosa; Nasal Swab  Result Value Ref Range Status   MRSA by PCR Next Gen NOT DETECTED NOT DETECTED Final    Comment: (NOTE) The GeneXpert MRSA Assay (FDA approved for NASAL specimens only), is one component of a comprehensive MRSA colonization surveillance program. It is not intended to diagnose MRSA infection nor to guide or monitor treatment for MRSA infections. Test performance is not FDA approved in patients less than 35 years old. Performed at [facility], [address] [place], [place] [address]     Procedures and diagnostic studies:  No results found.             LOS: 4 days   Ajwa Kimberley  [facility]   Pager on [email or web address]. If 7PM-7AM, please contact night-coverage at [email or web address]     09/05/2023, 1:16 PM

## 2023-09-05 NOTE — Progress Notes (Signed)
 [facility] Liaison Note  Re-evaluated patient for the Hospice home this am.  Patient is still not IPU appropriate.  HL will be happy to re-evaluate if there are changes in the patient's condition.    Please call with any Hospice related questions or concerns  Thank you for the opportunity to participate in this patient's care  [facility] Liaison [phone]

## 2023-09-05 NOTE — Plan of Care (Signed)
  Problem: Coping: Goal: Ability to adjust to condition or change in health will improve Outcome: Progressing   Problem: Metabolic: Goal: Ability to maintain appropriate glucose levels will improve Outcome: Progressing   Problem: Clinical Measurements: Goal: Ability to maintain clinical measurements within normal limits will improve Outcome: Not Progressing   Problem: Activity: Goal: Risk for activity intolerance will decrease Outcome: Not Progressing

## 2023-09-06 DIAGNOSIS — A419 Sepsis, unspecified organism: Secondary | ICD-10-CM | POA: Diagnosis not present

## 2023-09-06 DIAGNOSIS — R6521 Severe sepsis with septic shock: Secondary | ICD-10-CM | POA: Diagnosis not present

## 2023-09-06 NOTE — Progress Notes (Signed)
 Progress Note    Emma Guzman  QIO:[id number] DOB: [birth year]  DOA: 09/01/2023 PCP: Alexander Anes, MD      Brief Narrative:    Medical records reviewed and are as summarized below:  Emma Guzman is a 78 y.o. female with past medical history of HFrEF status post ICD, CKD, diabetes with previous DKA, and hypertension, peripheral neuropathy, recent hospitalization about a month prior to admission for fall and rhabdomyolysis, who presented to the hospital with general weakness, word finding difficulty and altered mental status.  She was hypotensive in the ED.  She was started on IV fluids and antibiotics for sepsis and UTI and was admitted to the medicine service.  However, despite IV fluids, patient became more hypotensive.  Intensivist was consulted and patient was transferred to the ICU for further management.  She was treated with IV vasopressors.       Assessment/Plan:   Principal Problem:   Septic shock (HCC) Active Problems:   E. coli UTI   E coli bacteremia   AMS (altered mental status)    Body mass index is 22.7 kg/m.   Septic shock secondary to acute E. coli UTI and bacteremia Acute toxic metabolic encephalopathy History of HFrEF s/p ICD but now with recovered EF (2D echo on 09/02/2023 showed EF estimated at 50 to 55%).  Hypokalemia  Insulin -dependent diabetes mellitus  Nausea and vomiting   PLAN   S/p treatment with 4 days of IV ceftriaxone  (completed on 09/04/2023). She was also treated with IV fluids and vasopressors. Patient is not eligible for hospice facility at this time Follow-up with TOC to assist with disposition Plan discussed with [name], son at the bedside    Diet Order             Diet regular Room service appropriate? Yes with Assist; Fluid consistency: Thin  Diet effective now                            Consultants: Intensivists Cardiologist  Procedures: None    Medications:    nystatin  5 mL  Oral QID   Continuous Infusions:     Anti-infectives (From admission, onward)    Start     Dose/Rate Route Frequency Ordered Stop   09/02/23 1000  cefTRIAXone  (ROCEPHIN ) 1 g in sodium chloride  0.9 % 100 mL IVPB  Status:  Discontinued        1 g 200 mL/hr over 30 Minutes Intravenous Every 24 hours 09/01/23 1036 09/01/23 1830   09/02/23 0600  cefTRIAXone  (ROCEPHIN ) 2 g in sodium chloride  0.9 % 100 mL IVPB  Status:  Discontinued        2 g 200 mL/hr over 30 Minutes Intravenous Every 24 hours 09/01/23 2227 09/04/23 1330   09/01/23 2000  piperacillin-tazobactam (ZOSYN) IVPB 3.375 g  Status:  Discontinued        3.375 g 12.5 mL/hr over 240 Minutes Intravenous Every 8 hours 09/01/23 1830 09/01/23 2227   09/01/23 1015  cefTRIAXone  (ROCEPHIN ) 1 g in sodium chloride  0.9 % 100 mL IVPB        1 g 200 mL/hr over 30 Minutes Intravenous STAT 09/01/23 1004 09/01/23 1123              Family Communication/Anticipated D/C date and plan/Code Status   DVT prophylaxis:      Code Status: Do not attempt resuscitation (DNR) - Comfort care  Family Communication: Plan discussed with  with [name], son, at the bedside Disposition Plan: Plan to discharge to hospice house   Status is: Inpatient Remains inpatient appropriate because: Comfort care       Subjective:   No acute events overnight.  She has no complaints.  [name], son, was at the bedside.  Objective:    Vitals:   09/04/23 2002 09/05/23 0826 09/05/23 1950 09/06/23 0816  BP: 114/80 123/75 122/67 109/73  Pulse: 97 (!) 102 89 85  Resp: 18 15 16 15   Temp: 97.9 F (36.6 C) 98.1 F (36.7 C) 97.9 F (36.6 C) 97.9 F (36.6 C)  TempSrc:   Oral   SpO2: 97% 96% 96% 100%  Weight:      Height:       No data found.   Intake/Output Summary (Last 24 hours) at 09/06/2023 1445 Last data filed at 09/05/2023 1900 Gross per 24 hour  Intake 0 ml  Output --  Net 0 ml   Filed Weights   09/01/23 0641 09/01/23 1700  Weight: 54.3 kg  54.5 kg    Exam:  GEN: NAD SKIN: No rash EYES: No pallor or icterus ENT: MMM CV: RRR PULM: CTA B ABD: soft, ND, NT, +BS CNS: AAO x person, non focal EXT: No edema or tenderness     Data Reviewed:   I have personally reviewed following labs and imaging studies:  Labs: Labs show the following:   Basic Metabolic Panel: Recent Labs  Lab 09/01/23 0651 09/01/23 0849 09/01/23 1750 09/01/23 1928 09/02/23 0436 09/03/23 0430  NA 138  --  138 138 139 136  K 4.2  --  2.7* 3.2* 3.2* 3.0*  CL 103  --  105 105 104 102  CO2 27  --  21* 20* 23 24  GLUCOSE 208*  --  117* 173* 243* 234*  BUN 34*  --  33* 35* 43* 40*  CREATININE 1.18*  --  1.15* 1.11* 1.14* 0.80  CALCIUM 9.4  --  9.1 9.3 9.4 9.6  MG  --  1.3*  --   --   --  1.7  PHOS  --  4.0  --   --   --   --    GFR Estimated Creatinine Clearance: 43.7 mL/min (by C-G formula based on SCr of 0.8 mg/dL). Liver Function Tests: Recent Labs  Lab 09/01/23 0651  AST 19  ALT 19  ALKPHOS 64  BILITOT 0.9  PROT 6.9  ALBUMIN 3.2*   No results for input(s): "LIPASE", "AMYLASE" in the last 168 hours. No results for input(s): "AMMONIA" in the last 168 hours. Coagulation profile No results for input(s): "INR", "PROTIME" in the last 168 hours.  CBC: Recent Labs  Lab 09/01/23 0651 09/02/23 0436 09/03/23 0430  WBC 12.0* 14.6* 10.8*  NEUTROABS 10.2*  --   --   HGB 11.3* 10.1* 10.3*  HCT 33.4* 31.0* 30.3*  MCV 93.0 92.5 90.7  PLT 137* 133* 142*   Cardiac Enzymes: Recent Labs  Lab 09/01/23 0651  CKTOTAL 151   BNP (last 3 results) No results for input(s): "PROBNP" in the last 8760 hours. CBG: Recent Labs  Lab 09/01/23 1646 09/01/23 1722 09/01/23 1933 09/01/23 1959 09/02/23 0731  GLUCAP 113* 109* 184* 198* 191*   D-Dimer: No results for input(s): "DDIMER" in the last 72 hours. Hgb A1c: No results for input(s): "HGBA1C" in the last 72 hours. Lipid Profile: No results for input(s): "CHOL", "HDL", "LDLCALC",  "TRIG", "CHOLHDL", "LDLDIRECT" in the last 72 hours. Thyroid function studies: No  results for input(s): "TSH", "T4TOTAL", "T3FREE", "THYROIDAB" in the last 72 hours.  Invalid input(s): "FREET3" Anemia work up: No results for input(s): "VITAMINB12", "FOLATE", "FERRITIN", "TIBC", "IRON", "RETICCTPCT" in the last 72 hours. Sepsis Labs: Recent Labs  Lab 09/01/23 0651 09/01/23 0838 09/01/23 1928 09/01/23 2130 09/01/23 2311 09/02/23 0436 09/03/23 0430  WBC 12.0*  --   --   --   --  14.6* 10.8*  LATICACIDVEN  --  1.5 2.4* 1.6 1.2  --   --     Microbiology Recent Results (from the past 240 hours)  Culture, blood (routine x 2)     Status: Abnormal   Collection Time: 09/01/23  6:51 AM   Specimen: BLOOD  Result Value Ref Range Status   Specimen Description   Final    BLOOD BLOOD RIGHT ARM Performed at [facility], [address], [place], [place] [address]    Special Requests   Final    BOTTLES DRAWN AEROBIC AND ANAEROBIC Blood Culture adequate volume Performed at [facility], [address] [place], [place] [address]    Culture  Setup Time   Final    GRAM NEGATIVE RODS AEROBIC BOTTLE ONLY CRITICAL VALUE NOTED.  VALUE IS CONSISTENT WITH PREVIOUSLY REPORTED AND CALLED VALUE. Performed at [facility], [address] [place], [place] [address]    Culture (A)  Final    ESCHERICHIA COLI SUSCEPTIBILITIES PERFORMED ON PREVIOUS CULTURE WITHIN THE LAST 5 DAYS. Performed at [facility], [address] [place], [place] [address]    Report Status 09/04/2023 FINAL  Final  Culture, blood (routine x 2)     Status: Abnormal   Collection Time: 09/01/23  6:51 AM   Specimen: BLOOD  Result Value Ref Range Status   Specimen Description   Final    BLOOD BLOOD LEFT HAND Performed at [facility], [address], [place], [place] [address]    Special Requests   Final    BOTTLES DRAWN AEROBIC AND ANAEROBIC Blood Culture adequate  volume Performed at [facility], [address] [place], [place] [address]    Culture  Setup Time   Final    GRAM NEGATIVE RODS IN BOTH AEROBIC AND ANAEROBIC BOTTLES CRITICAL RESULT CALLED TO, READ BACK BY AND VERIFIED WITH: WILL ANDERSON, PharMD @2210  09/01/2023 COP Performed at [facility], [address], [place], [place] [address]    Culture ESCHERICHIA COLI (A)  Final   Report Status 09/04/2023 FINAL  Final   Organism ID, Bacteria ESCHERICHIA COLI  Final   Organism ID, Bacteria ESCHERICHIA COLI  Final      Susceptibility   Escherichia coli - KIRBY BAUER*    CEFAZOLIN  INTERMEDIATE Intermediate    Escherichia coli - MIC*    AMPICILLIN >=32 RESISTANT Resistant     CEFEPIME <=0.12 SENSITIVE Sensitive     CEFTAZIDIME <=1 SENSITIVE Sensitive     CEFTRIAXONE  <=0.25 SENSITIVE Sensitive     CIPROFLOXACIN <=0.25 SENSITIVE Sensitive     GENTAMICIN  <=1 SENSITIVE Sensitive     IMIPENEM <=0.25 SENSITIVE Sensitive     TRIMETH/SULFA <=20 SENSITIVE Sensitive     AMPICILLIN/SULBACTAM 16 INTERMEDIATE Intermediate     PIP/TAZO <=4 SENSITIVE Sensitive ug/mL    * ESCHERICHIA COLI    ESCHERICHIA COLI  Resp panel by RT-PCR (RSV, Flu A&B, Covid) Anterior Nasal Swab     Status: None   Collection Time: 09/01/23  6:51 AM   Specimen: Anterior Nasal Swab  Result Value  Ref Range Status   SARS Coronavirus 2 by RT PCR NEGATIVE NEGATIVE Final    Comment: (NOTE) SARS-CoV-2 target nucleic acids are NOT DETECTED.  The SARS-CoV-2 RNA is generally detectable in upper respiratory specimens during the acute phase of infection. The lowest concentration of SARS-CoV-2 viral copies this assay can detect is 138 copies/mL. A negative result does not preclude SARS-Cov-2 infection and should not be used as the sole basis for treatment or other patient management decisions. A negative result may occur with  improper specimen collection/handling, submission of specimen other than nasopharyngeal  swab, presence of viral mutation(s) within the areas targeted by this assay, and inadequate number of viral copies(<138 copies/mL). A negative result must be combined with clinical observations, patient history, and epidemiological information. The expected result is Negative.  Fact Sheet for Patients:  BloggerCourse.com  Fact Sheet for Healthcare Providers:  SeriousBroker.it  This test is no t yet approved or cleared by the [place]  FDA and  has been authorized for detection and/or diagnosis of SARS-CoV-2 by FDA under an Emergency Use Authorization (EUA). This EUA will remain  in effect (meaning this test can be used) for the duration of the COVID-19 declaration under Section 564(b)(1) of the Act, 21 U.S.C.section 360bbb-3(b)(1), unless the authorization is terminated  or revoked sooner.       Influenza A by PCR NEGATIVE NEGATIVE Final   Influenza B by PCR NEGATIVE NEGATIVE Final    Comment: (NOTE) The Xpert Xpress SARS-CoV-2/FLU/RSV plus assay is intended as an aid in the diagnosis of influenza from Nasopharyngeal swab specimens and should not be used as a sole basis for treatment. Nasal washings and aspirates are unacceptable for Xpert Xpress SARS-CoV-2/FLU/RSV testing.  Fact Sheet for Patients: BloggerCourse.com  Fact Sheet for Healthcare Providers: SeriousBroker.it  This test is not yet approved or cleared by the [place]  FDA and has been authorized for detection and/or diagnosis of SARS-CoV-2 by FDA under an Emergency Use Authorization (EUA). This EUA will remain in effect (meaning this test can be used) for the duration of the COVID-19 declaration under Section 564(b)(1) of the Act, 21 U.S.C. section 360bbb-3(b)(1), unless the authorization is terminated or revoked.     Resp Syncytial Virus by PCR NEGATIVE NEGATIVE Final    Comment: (NOTE) Fact Sheet for  Patients: BloggerCourse.com  Fact Sheet for Healthcare Providers: SeriousBroker.it  This test is not yet approved or cleared by the [place]  FDA and has been authorized for detection and/or diagnosis of SARS-CoV-2 by FDA under an Emergency Use Authorization (EUA). This EUA will remain in effect (meaning this test can be used) for the duration of the COVID-19 declaration under Section 564(b)(1) of the Act, 21 U.S.C. section 360bbb-3(b)(1), unless the authorization is terminated or revoked.  Performed at [facility], [address] [place], [place] [address]   Blood Culture ID Panel (Reflexed)     Status: Abnormal   Collection Time: 09/01/23  6:51 AM  Result Value Ref Range Status   Enterococcus faecalis NOT DETECTED NOT DETECTED Final   Enterococcus Faecium NOT DETECTED NOT DETECTED Final   Listeria monocytogenes NOT DETECTED NOT DETECTED Final   Staphylococcus species NOT DETECTED NOT DETECTED Final   Staphylococcus aureus (BCID) NOT DETECTED NOT DETECTED Final   Staphylococcus epidermidis NOT DETECTED NOT DETECTED Final   Staphylococcus lugdunensis NOT DETECTED NOT DETECTED Final   Streptococcus species NOT DETECTED NOT DETECTED Final   Streptococcus agalactiae NOT DETECTED NOT DETECTED Final   Streptococcus pneumoniae NOT DETECTED NOT  DETECTED Final   Streptococcus pyogenes NOT DETECTED NOT DETECTED Final   A.calcoaceticus-baumannii NOT DETECTED NOT DETECTED Final   Bacteroides fragilis NOT DETECTED NOT DETECTED Final   Enterobacterales DETECTED (A) NOT DETECTED Final    Comment: Enterobacterales represent a large order of gram negative bacteria, not a single organism. CRITICAL RESULT CALLED TO, READ BACK BY AND VERIFIED WITH: WILL ANDERSON, PharmMD @2210  09/01/2023 COP    Enterobacter cloacae complex NOT DETECTED NOT DETECTED Final   Escherichia coli DETECTED (A) NOT DETECTED Final    Comment: CRITICAL RESULT  CALLED TO, READ BACK BY AND VERIFIED WITH: WILL ANDERSON, PharmMD @2210  09/01/2023 COP    Klebsiella aerogenes NOT DETECTED NOT DETECTED Final   Klebsiella oxytoca NOT DETECTED NOT DETECTED Final   Klebsiella pneumoniae NOT DETECTED NOT DETECTED Final   Proteus species NOT DETECTED NOT DETECTED Final   Salmonella species NOT DETECTED NOT DETECTED Final   Serratia marcescens NOT DETECTED NOT DETECTED Final   Haemophilus influenzae NOT DETECTED NOT DETECTED Final   Neisseria meningitidis NOT DETECTED NOT DETECTED Final   Pseudomonas aeruginosa NOT DETECTED NOT DETECTED Final   Stenotrophomonas maltophilia NOT DETECTED NOT DETECTED Final   Candida albicans NOT DETECTED NOT DETECTED Final   Candida auris NOT DETECTED NOT DETECTED Final   Candida glabrata NOT DETECTED NOT DETECTED Final   Candida krusei NOT DETECTED NOT DETECTED Final   Candida parapsilosis NOT DETECTED NOT DETECTED Final   Candida tropicalis NOT DETECTED NOT DETECTED Final   Cryptococcus neoformans/gattii NOT DETECTED NOT DETECTED Final   CTX-M ESBL NOT DETECTED NOT DETECTED Final   Carbapenem resistance IMP NOT DETECTED NOT DETECTED Final   Carbapenem resistance KPC NOT DETECTED NOT DETECTED Final   Carbapenem resistance NDM NOT DETECTED NOT DETECTED Final   Carbapenem resist OXA 48 LIKE NOT DETECTED NOT DETECTED Final   Carbapenem resistance VIM NOT DETECTED NOT DETECTED Final    Comment: Performed at [facility], [address], [place], [place] [address]  Urine Culture     Status: Abnormal   Collection Time: 09/01/23  9:07 AM   Specimen: Urine, Random  Result Value Ref Range Status   Specimen Description   Final    URINE, RANDOM Performed at [facility], [address] [place], [place] [address]    Special Requests   Final    NONE Reflexed from [id number] Performed at [facility], [address] [place], [place] [address]    Culture >=100,000 COLONIES/mL ESCHERICHIA COLI (A)   Final   Report Status 09/03/2023 FINAL  Final   Organism ID, Bacteria ESCHERICHIA COLI (A)  Final      Susceptibility   Escherichia coli - MIC*    AMPICILLIN >=32 RESISTANT Resistant     CEFAZOLIN  <=4 SENSITIVE Sensitive     CEFEPIME <=0.12 SENSITIVE Sensitive     CEFTRIAXONE  <=0.25 SENSITIVE Sensitive     CIPROFLOXACIN <=0.25 SENSITIVE Sensitive     GENTAMICIN  <=1 SENSITIVE Sensitive     IMIPENEM <=0.25 SENSITIVE Sensitive     NITROFURANTOIN <=16 SENSITIVE Sensitive     TRIMETH/SULFA <=20 SENSITIVE Sensitive     AMPICILLIN/SULBACTAM 16 INTERMEDIATE Intermediate     PIP/TAZO <=4 SENSITIVE Sensitive ug/mL    * >=100,000 COLONIES/mL ESCHERICHIA COLI  MRSA Next Gen by PCR, Nasal     Status: None   Collection Time: 09/01/23  5:13 PM   Specimen: Nasal Mucosa; Nasal Swab  Result Value Ref Range Status   MRSA by PCR Next Gen NOT  DETECTED NOT DETECTED Final    Comment: (NOTE) The GeneXpert MRSA Assay (FDA approved for NASAL specimens only), is one component of a comprehensive MRSA colonization surveillance program. It is not intended to diagnose MRSA infection nor to guide or monitor treatment for MRSA infections. Test performance is not FDA approved in patients less than 37 years old. Performed at [facility], [address] [place], [place] [address]     Procedures and diagnostic studies:  No results found.             LOS: 5 days   Emma Guzman  [facility]   Pager on [email or web address]. If 7PM-7AM, please contact night-coverage at [email or web address]     09/06/2023, 2:45 PM

## 2023-09-06 NOTE — Progress Notes (Signed)
 Nutrition Brief Note  Chart reviewed. Pt now transitioning to comfort care.  No further nutrition interventions planned at this time.  Please re-consult as needed.   Levada Schilling, RD, LDN, CDCES Registered Dietitian III Certified Diabetes Care and Education Specialist If unable to reach this RD, please use "RD Inpatient" group chat on secure chat between hours of 8am-4 pm daily

## 2023-09-06 NOTE — Progress Notes (Signed)
 [facility] Liaison Note  HL spoke with son today.  He reported that patient ate a little sherbet today and drank a few sips of water. She was alert most of the day.  He would like for Hospice Liaison to check on his mother,again, tomorrow.    Please call with any Hospice related questions or concerns.  Thank you for the opportunity to participate in this patient's care.  [facility] Liaison [phone]

## 2023-09-06 NOTE — Plan of Care (Signed)
  Problem: Coping: Goal: Ability to adjust to condition or change in health will improve Outcome: Progressing   Problem: Skin Integrity: Goal: Risk for impaired skin integrity will decrease Outcome: Progressing   Problem: Clinical Measurements: Goal: Ability to maintain clinical measurements within normal limits will improve Outcome: Progressing   Problem: Health Behavior/Discharge Planning: Goal: Ability to manage health-related needs will improve Outcome: Not Progressing   Problem: Activity: Goal: Risk for activity intolerance will decrease Outcome: Not Progressing

## 2023-09-06 NOTE — Plan of Care (Signed)

## 2023-09-07 DIAGNOSIS — R6521 Severe sepsis with septic shock: Secondary | ICD-10-CM | POA: Diagnosis not present

## 2023-09-07 DIAGNOSIS — A419 Sepsis, unspecified organism: Secondary | ICD-10-CM | POA: Diagnosis not present

## 2023-09-07 NOTE — Progress Notes (Addendum)
 Progress Note    Emma Guzman  ZOX:[id number] DOB: 24-Dec-1945  DOA: 09/01/2023 PCP: Alexander Anes, MD      Brief Narrative:    Medical records reviewed and are as summarized below:  Emma Guzman is a 78 y.o. female with past medical history of HFrEF status post ICD, CKD, diabetes with previous DKA, and hypertension, peripheral neuropathy, recent hospitalization about a month prior to admission for fall and rhabdomyolysis, who presented to the hospital with general weakness, word finding difficulty and altered mental status.  She was hypotensive in the ED.  She was started on IV fluids and antibiotics for sepsis and UTI and was admitted to the medicine service.  However, despite IV fluids, patient became more hypotensive.  Intensivist was consulted and patient was transferred to the ICU for further management.  She was treated with IV vasopressors.       Assessment/Plan:   Principal Problem:   Septic shock (HCC) Active Problems:   E. coli UTI   E coli bacteremia   AMS (altered mental status)    Body mass index is 22.7 kg/m.   Septic shock secondary to acute E. coli UTI and bacteremia Acute toxic metabolic encephalopathy History of HFrEF s/p ICD but now with recovered EF (2D echo on 09/02/2023 showed EF estimated at 50 to 55%).  Hypokalemia  Insulin -dependent diabetes mellitus  Nausea and vomiting   PLAN   S/p treatment with 4 days of IV ceftriaxone  (completed on 09/04/2023). She was also treated with IV fluids and vasopressors. Patient is not eligible for hospice facility at this time Follow-up with TOC to assist with disposition Continue comfort measures. Plan discussed with,Kevin, son, at the bedside    Diet Order             Diet regular Room service appropriate? Yes with Assist; Fluid consistency: Thin  Diet effective now                             Consultants: Intensivists Cardiologist  Procedures: None    Medications:    nystatin   5 mL Oral QID   Continuous Infusions:     Anti-infectives (From admission, onward)    Start     Dose/Rate Route Frequency Ordered Stop   09/02/23 1000  cefTRIAXone  (ROCEPHIN ) 1 g in sodium chloride  0.9 % 100 mL IVPB  Status:  Discontinued        1 g 200 mL/hr over 30 Minutes Intravenous Every 24 hours 09/01/23 1036 09/01/23 1830   09/02/23 0600  cefTRIAXone  (ROCEPHIN ) 2 g in sodium chloride  0.9 % 100 mL IVPB  Status:  Discontinued        2 g 200 mL/hr over 30 Minutes Intravenous Every 24 hours 09/01/23 2227 09/04/23 1330   09/01/23 2000  piperacillin -tazobactam (ZOSYN ) IVPB 3.375 g  Status:  Discontinued        3.375 g 12.5 mL/hr over 240 Minutes Intravenous Every 8 hours 09/01/23 1830 09/01/23 2227   09/01/23 1015  cefTRIAXone  (ROCEPHIN ) 1 g in sodium chloride  0.9 % 100 mL IVPB        1 g 200 mL/hr over 30 Minutes Intravenous STAT 09/01/23 1004 09/01/23 1123              Family Communication/Anticipated D/C date and plan/Code Status   DVT prophylaxis:      Code Status: Do not attempt resuscitation (DNR) - Comfort care  Family Communication: Plan  discussed with with [name], son, at the bedside Disposition Plan: Plan to discharge to hospice house   Status is: Inpatient Remains inpatient appropriate because: Comfort care       Subjective:   Interval events noted.  Patient appears more confused today.  She complains of pain but unable to describe characteristics of the pain.  [name], son, was at the bedside.  Objective:    Vitals:   09/05/23 1950 09/06/23 0816 09/06/23 2111 09/07/23 0734  BP: 122/67 109/73 136/71 120/73  Pulse: 89 85 81 83  Resp: 16 15 18 16   Temp: 97.9 F (36.6 C) 97.9 F (36.6 C) 98.2 F (36.8 C) 98.3 F (36.8 C)  TempSrc: Oral     SpO2: 96% 100% 99% 99%  Weight:      Height:       No data  found.   Intake/Output Summary (Last 24 hours) at 09/07/2023 1404 Last data filed at 09/07/2023 0458 Gross per 24 hour  Intake 30 ml  Output 500 ml  Net -470 ml   Filed Weights   09/01/23 0641 09/01/23 1700  Weight: 54.3 kg 54.5 kg    Exam:  GEN: NAD SKIN: Warm and dry EYES: No pallor or icterus ENT: MMM CV: RRR PULM: CTA B ABD: soft, ND, NT, +BS CNS: Alert, disoriented EXT: No edema or tenderness    Data Reviewed:   I have personally reviewed following labs and imaging studies:  Labs: Labs show the following:   Basic Metabolic Panel: Recent Labs  Lab 09/01/23 0651 09/01/23 0849 09/01/23 1750 09/01/23 1928 09/02/23 0436 09/03/23 0430  NA 138  --  138 138 139 136  K 4.2  --  2.7* 3.2* 3.2* 3.0*  CL 103  --  105 105 104 102  CO2 27  --  21* 20* 23 24  GLUCOSE 208*  --  117* 173* 243* 234*  BUN 34*  --  33* 35* 43* 40*  CREATININE 1.18*  --  1.15* 1.11* 1.14* 0.80  CALCIUM 9.4  --  9.1 9.3 9.4 9.6  MG  --  1.3*  --   --   --  1.7  PHOS  --  4.0  --   --   --   --    GFR Estimated Creatinine Clearance: 43.7 mL/min (by C-G formula based on SCr of 0.8 mg/dL). Liver Function Tests: Recent Labs  Lab 09/01/23 0651  AST 19  ALT 19  ALKPHOS 64  BILITOT 0.9  PROT 6.9  ALBUMIN 3.2*   No results for input(s): "LIPASE", "AMYLASE" in the last 168 hours. No results for input(s): "AMMONIA" in the last 168 hours. Coagulation profile No results for input(s): "INR", "PROTIME" in the last 168 hours.  CBC: Recent Labs  Lab 09/01/23 0651 09/02/23 0436 09/03/23 0430  WBC 12.0* 14.6* 10.8*  NEUTROABS 10.2*  --   --   HGB 11.3* 10.1* 10.3*  HCT 33.4* 31.0* 30.3*  MCV 93.0 92.5 90.7  PLT 137* 133* 142*   Cardiac Enzymes: Recent Labs  Lab 09/01/23 0651  CKTOTAL 151   BNP (last 3 results) No results for input(s): "PROBNP" in the last 8760 hours. CBG: Recent Labs  Lab 09/01/23 1646 09/01/23 1722 09/01/23 1933 09/01/23 1959 09/02/23 0731  GLUCAP 113*  109* 184* 198* 191*   D-Dimer: No results for input(s): "DDIMER" in the last 72 hours. Hgb A1c: No results for input(s): "HGBA1C" in the last 72 hours. Lipid Profile: No results for input(s): "CHOL", "HDL", "LDLCALC", "  TRIG", "CHOLHDL", "LDLDIRECT" in the last 72 hours. Thyroid function studies: No results for input(s): "TSH", "T4TOTAL", "T3FREE", "THYROIDAB" in the last 72 hours.  Invalid input(s): "FREET3" Anemia work up: No results for input(s): "VITAMINB12", "FOLATE", "FERRITIN", "TIBC", "IRON", "RETICCTPCT" in the last 72 hours. Sepsis Labs: Recent Labs  Lab 09/01/23 0651 09/01/23 0838 09/01/23 1928 09/01/23 2130 09/01/23 2311 09/02/23 0436 09/03/23 0430  WBC 12.0*  --   --   --   --  14.6* 10.8*  LATICACIDVEN  --  1.5 2.4* 1.6 1.2  --   --     Microbiology Recent Results (from the past 240 hours)  Culture, blood (routine x 2)     Status: Abnormal   Collection Time: 09/01/23  6:51 AM   Specimen: BLOOD  Result Value Ref Range Status   Specimen Description   Final    BLOOD BLOOD RIGHT ARM Performed at [facility], [address], [place], [place] [address]    Special Requests   Final    BOTTLES DRAWN AEROBIC AND ANAEROBIC Blood Culture adequate volume Performed at [facility], [address], [place], [place] [address]    Culture  Setup Time   Final    GRAM NEGATIVE RODS AEROBIC BOTTLE ONLY CRITICAL VALUE NOTED.  VALUE IS CONSISTENT WITH PREVIOUSLY REPORTED AND CALLED VALUE. Performed at [facility], [address] [place], [place] [address]    Culture (A)  Final    ESCHERICHIA COLI SUSCEPTIBILITIES PERFORMED ON PREVIOUS CULTURE WITHIN THE LAST 5 DAYS. Performed at [facility], [address], [place], [place] [address]    Report Status 09/04/2023 FINAL  Final  Culture, blood (routine x 2)     Status: Abnormal   Collection Time: 09/01/23  6:51 AM   Specimen: BLOOD  Result Value Ref Range Status   Specimen  Description   Final    BLOOD BLOOD LEFT HAND Performed at [facility], [address], [place], [place] [address]    Special Requests   Final    BOTTLES DRAWN AEROBIC AND ANAEROBIC Blood Culture adequate volume Performed at [facility], [address] [place], [place] [address]    Culture  Setup Time   Final    GRAM NEGATIVE RODS IN BOTH AEROBIC AND ANAEROBIC BOTTLES CRITICAL RESULT CALLED TO, READ BACK BY AND VERIFIED WITH: WILL ANDERSON, PharMD @2210  09/01/2023 COP Performed at [facility], [address], [place], [place] [address]    Culture ESCHERICHIA COLI (A)  Final   Report Status 09/04/2023 FINAL  Final   Organism ID, Bacteria ESCHERICHIA COLI  Final   Organism ID, Bacteria ESCHERICHIA COLI  Final      Susceptibility   Escherichia coli - KIRBY BAUER*    CEFAZOLIN  INTERMEDIATE Intermediate    Escherichia coli - MIC*    AMPICILLIN >=32 RESISTANT Resistant     CEFEPIME <=0.12 SENSITIVE Sensitive     CEFTAZIDIME <=1 SENSITIVE Sensitive     CEFTRIAXONE  <=0.25 SENSITIVE Sensitive     CIPROFLOXACIN <=0.25 SENSITIVE Sensitive     GENTAMICIN  <=1 SENSITIVE Sensitive     IMIPENEM <=0.25 SENSITIVE Sensitive     TRIMETH/SULFA <=20 SENSITIVE Sensitive     AMPICILLIN/SULBACTAM 16 INTERMEDIATE Intermediate     PIP/TAZO <=4 SENSITIVE Sensitive ug/mL    * ESCHERICHIA COLI    ESCHERICHIA COLI  Resp panel by RT-PCR (RSV, Flu A&B, Covid) Anterior Nasal Swab     Status: None   Collection Time: 09/01/23  6:51 AM   Specimen: Anterior Nasal Swab  Result Value Ref Range Status   SARS Coronavirus 2 by RT PCR NEGATIVE NEGATIVE Final    Comment: (NOTE) SARS-CoV-2 target nucleic acids are NOT DETECTED.  The SARS-CoV-2 RNA is generally detectable in upper respiratory specimens during the acute phase of infection. The lowest concentration of SARS-CoV-2 viral copies this assay can detect is 138 copies/mL. A negative result does not preclude SARS-Cov-2 infection  and should not be used as the sole basis for treatment or other patient management decisions. A negative result may occur with  improper specimen collection/handling, submission of specimen other than nasopharyngeal swab, presence of viral mutation(s) within the areas targeted by this assay, and inadequate number of viral copies(<138 copies/mL). A negative result must be combined with clinical observations, patient history, and epidemiological information. The expected result is Negative.  Fact Sheet for Patients:  BloggerCourse.com  Fact Sheet for Healthcare Providers:  SeriousBroker.it  This test is no t yet approved or cleared by the [place]  FDA and  has been authorized for detection and/or diagnosis of SARS-CoV-2 by FDA under an Emergency Use Authorization (EUA). This EUA will remain  in effect (meaning this test can be used) for the duration of the COVID-19 declaration under Section 564(b)(1) of the Act, 21 U.S.C.section 360bbb-3(b)(1), unless the authorization is terminated  or revoked sooner.       Influenza A by PCR NEGATIVE NEGATIVE Final   Influenza B by PCR NEGATIVE NEGATIVE Final    Comment: (NOTE) The Xpert Xpress SARS-CoV-2/FLU/RSV plus assay is intended as an aid in the diagnosis of influenza from Nasopharyngeal swab specimens and should not be used as a sole basis for treatment. Nasal washings and aspirates are unacceptable for Xpert Xpress SARS-CoV-2/FLU/RSV testing.  Fact Sheet for Patients: BloggerCourse.com  Fact Sheet for Healthcare Providers: SeriousBroker.it  This test is not yet approved or cleared by the [place]  FDA and has been authorized for detection and/or diagnosis of SARS-CoV-2 by FDA under an Emergency Use Authorization (EUA). This EUA will remain in effect (meaning this test can be used) for the duration of the COVID-19 declaration  under Section 564(b)(1) of the Act, 21 U.S.C. section 360bbb-3(b)(1), unless the authorization is terminated or revoked.     Resp Syncytial Virus by PCR NEGATIVE NEGATIVE Final    Comment: (NOTE) Fact Sheet for Patients: BloggerCourse.com  Fact Sheet for Healthcare Providers: SeriousBroker.it  This test is not yet approved or cleared by the [place]  FDA and has been authorized for detection and/or diagnosis of SARS-CoV-2 by FDA under an Emergency Use Authorization (EUA). This EUA will remain in effect (meaning this test can be used) for the duration of the COVID-19 declaration under Section 564(b)(1) of the Act, 21 U.S.C. section 360bbb-3(b)(1), unless the authorization is terminated or revoked.  Performed at [facility], [address] [place], [place] [address]   Blood Culture ID Panel (Reflexed)     Status: Abnormal   Collection Time: 09/01/23  6:51 AM  Result Value Ref Range Status   Enterococcus faecalis NOT DETECTED NOT DETECTED Final   Enterococcus Faecium NOT DETECTED NOT DETECTED Final   Listeria monocytogenes NOT DETECTED NOT DETECTED Final   Staphylococcus species NOT DETECTED NOT DETECTED Final   Staphylococcus aureus (BCID) NOT DETECTED NOT DETECTED Final   Staphylococcus epidermidis NOT DETECTED NOT DETECTED Final   Staphylococcus lugdunensis NOT DETECTED NOT DETECTED Final   Streptococcus species NOT DETECTED NOT DETECTED Final   Streptococcus agalactiae NOT  DETECTED NOT DETECTED Final   Streptococcus pneumoniae NOT DETECTED NOT DETECTED Final   Streptococcus pyogenes NOT DETECTED NOT DETECTED Final   A.calcoaceticus-baumannii NOT DETECTED NOT DETECTED Final   Bacteroides fragilis NOT DETECTED NOT DETECTED Final   Enterobacterales DETECTED (A) NOT DETECTED Final    Comment: Enterobacterales represent a large order of gram negative bacteria, not a single organism. CRITICAL RESULT CALLED TO, READ  BACK BY AND VERIFIED WITH: WILL ANDERSON, PharmMD @2210  09/01/2023 COP    Enterobacter cloacae complex NOT DETECTED NOT DETECTED Final   Escherichia coli DETECTED (A) NOT DETECTED Final    Comment: CRITICAL RESULT CALLED TO, READ BACK BY AND VERIFIED WITH: WILL ANDERSON, PharmMD @2210  09/01/2023 COP    Klebsiella aerogenes NOT DETECTED NOT DETECTED Final   Klebsiella oxytoca NOT DETECTED NOT DETECTED Final   Klebsiella pneumoniae NOT DETECTED NOT DETECTED Final   Proteus species NOT DETECTED NOT DETECTED Final   Salmonella species NOT DETECTED NOT DETECTED Final   Serratia marcescens NOT DETECTED NOT DETECTED Final   Haemophilus influenzae NOT DETECTED NOT DETECTED Final   Neisseria meningitidis NOT DETECTED NOT DETECTED Final   Pseudomonas aeruginosa NOT DETECTED NOT DETECTED Final   Stenotrophomonas maltophilia NOT DETECTED NOT DETECTED Final   Candida albicans NOT DETECTED NOT DETECTED Final   Candida auris NOT DETECTED NOT DETECTED Final   Candida glabrata NOT DETECTED NOT DETECTED Final   Candida krusei NOT DETECTED NOT DETECTED Final   Candida parapsilosis NOT DETECTED NOT DETECTED Final   Candida tropicalis NOT DETECTED NOT DETECTED Final   Cryptococcus neoformans/gattii NOT DETECTED NOT DETECTED Final   CTX-M ESBL NOT DETECTED NOT DETECTED Final   Carbapenem resistance IMP NOT DETECTED NOT DETECTED Final   Carbapenem resistance KPC NOT DETECTED NOT DETECTED Final   Carbapenem resistance NDM NOT DETECTED NOT DETECTED Final   Carbapenem resist OXA 48 LIKE NOT DETECTED NOT DETECTED Final   Carbapenem resistance VIM NOT DETECTED NOT DETECTED Final    Comment: Performed at [facility], [address], [place], [place] [address]  Urine Culture     Status: Abnormal   Collection Time: 09/01/23  9:07 AM   Specimen: Urine, Random  Result Value Ref Range Status   Specimen Description   Final    URINE, RANDOM Performed at [facility], [address]  [place], [place] [address]    Special Requests   Final    NONE Reflexed from [id number] Performed at [facility], [address] [place], [place] [address]    Culture >=100,000 COLONIES/mL ESCHERICHIA COLI (A)  Final   Report Status 09/03/2023 FINAL  Final   Organism ID, Bacteria ESCHERICHIA COLI (A)  Final      Susceptibility   Escherichia coli - MIC*    AMPICILLIN >=32 RESISTANT Resistant     CEFAZOLIN  <=4 SENSITIVE Sensitive     CEFEPIME <=0.12 SENSITIVE Sensitive     CEFTRIAXONE  <=0.25 SENSITIVE Sensitive     CIPROFLOXACIN <=0.25 SENSITIVE Sensitive     GENTAMICIN  <=1 SENSITIVE Sensitive     IMIPENEM <=0.25 SENSITIVE Sensitive     NITROFURANTOIN <=16 SENSITIVE Sensitive     TRIMETH/SULFA <=20 SENSITIVE Sensitive     AMPICILLIN/SULBACTAM 16 INTERMEDIATE Intermediate     PIP/TAZO <=4 SENSITIVE Sensitive ug/mL    * >=100,000 COLONIES/mL ESCHERICHIA COLI  MRSA Next Gen by PCR, Nasal     Status: None   Collection Time: 09/01/23  5:13 PM   Specimen: Nasal Mucosa; Nasal Swab  Result Value  Ref Range Status   MRSA by PCR Next Gen NOT DETECTED NOT DETECTED Final    Comment: (NOTE) The GeneXpert MRSA Assay (FDA approved for NASAL specimens only), is one component of a comprehensive MRSA colonization surveillance program. It is not intended to diagnose MRSA infection nor to guide or monitor treatment for MRSA infections. Test performance is not FDA approved in patients less than 59 years old. Performed at [facility], [address] [place], [place] [address]     Procedures and diagnostic studies:  No results found.             LOS: 6 days   Hara Milholland  [facility]   Pager on [email or web address]. If 7PM-7AM, please contact night-coverage at [email or web address]     09/07/2023, 2:04 PM

## 2023-09-07 NOTE — Plan of Care (Signed)
  Problem: Metabolic: Goal: Ability to maintain appropriate glucose levels will improve Outcome: Progressing   Problem: Nutritional: Goal: Maintenance of adequate nutrition will improve Outcome: Progressing Goal: Progress toward achieving an optimal weight will improve Outcome: Progressing   Problem: Skin Integrity: Goal: Risk for impaired skin integrity will decrease Outcome: Progressing   Problem: Tissue Perfusion: Goal: Adequacy of tissue perfusion will improve Outcome: Progressing   Problem: Safety: Goal: Ability to remain free from injury will improve Outcome: Progressing

## 2023-09-07 NOTE — Plan of Care (Signed)
  Problem: Activity: Goal: Risk for activity intolerance will decrease Outcome: Progressing   Problem: Elimination: Goal: Will not experience complications related to bowel motility Outcome: Progressing   Problem: Education: Goal: Knowledge of the prescribed therapeutic regimen will improve Outcome: Progressing   Problem: Coping: Goal: Ability to identify and develop effective coping behavior will improve Outcome: Progressing   Problem: Clinical Measurements: Goal: Quality of life will improve Outcome: Progressing   Problem: Respiratory: Goal: Verbalizations of increased ease of respirations will increase Outcome: Progressing   Problem: Pain Management: Goal: Satisfaction with pain management regimen will improve Outcome: Progressing

## 2023-09-07 NOTE — Progress Notes (Signed)
 [facility] Liaison Note  Follow up on new referral for hospice services.  Son would prefer IPU, but patient has not met criteria for IPU.  Patient had 4 doses of Morphine  yesterday and 1 today.    If she continues need for morphine , patient should qualify for symptom management.  We currently do not have any open routine level of care beds.  Hospital Liaison Team will continue to follow.  Ambrosio Junker, RN Nurse Liaison [phone]

## 2023-09-08 DIAGNOSIS — A419 Sepsis, unspecified organism: Secondary | ICD-10-CM | POA: Diagnosis not present

## 2023-09-08 DIAGNOSIS — R6521 Severe sepsis with septic shock: Secondary | ICD-10-CM | POA: Diagnosis not present

## 2023-09-08 MED ORDER — SCOPOLAMINE 1 MG/3DAYS TD PT72
1.0000 | MEDICATED_PATCH | TRANSDERMAL | Status: DC
  Administered 2023-09-08: 1.5 mg via TRANSDERMAL
  Filled 2023-09-08: qty 1

## 2023-09-08 NOTE — Progress Notes (Signed)
 Patient noted to be having labored breathing and excessive secretions, unable to clear. Patient medicated per [facility]

## 2023-09-08 NOTE — Progress Notes (Signed)
 Progress Note    DAYLE SHERPA  ZOX:[id number] DOB: 05-02-1945  DOA: 09/01/2023 PCP: Alexander Anes, MD      Brief Narrative:    Medical records reviewed and are as summarized below:  RAEJEAN SWINFORD is a 78 y.o. female with past medical history of HFrEF status post ICD, CKD, diabetes with previous DKA, and hypertension, peripheral neuropathy, recent hospitalization about a month prior to admission for fall and rhabdomyolysis, who presented to the hospital with general weakness, word finding difficulty and altered mental status.  She was hypotensive in the ED.  She was started on IV fluids and antibiotics for sepsis and UTI and was admitted to the medicine service.  However, despite IV fluids, patient became more hypotensive.  Intensivist was consulted and patient was transferred to the ICU for further management.  She was treated with IV vasopressors.       Assessment/Plan:   Principal Problem:   Septic shock (HCC) Active Problems:   E. coli UTI   E coli bacteremia   AMS (altered mental status)    Body mass index is 22.7 kg/m.   Septic shock secondary to acute E. coli UTI and bacteremia Acute toxic metabolic encephalopathy History of HFrEF s/p ICD but now with recovered EF (2D echo on 09/02/2023 showed EF estimated at 50 to 55%).  Hypokalemia  Insulin -dependent diabetes mellitus  Nausea and vomiting   PLAN   S/p treatment with 4 days of IV ceftriaxone  (completed on 09/04/2023). She was also treated with IV fluids and vasopressors. Patient is not eligible for hospice facility at this time Follow-up with TOC to assist with disposition Can you comfort care.  Anticipate hospital death. Plan discussed with [name], son, at the bedside    Diet Order             Diet regular Room service appropriate? Yes with Assist; Fluid consistency: Thin  Diet effective now                             Consultants: Intensivists Cardiologist  Procedures: None    Medications:    nystatin   5 mL Oral QID   Continuous Infusions:     Anti-infectives (From admission, onward)    Start     Dose/Rate Route Frequency Ordered Stop   09/02/23 1000  cefTRIAXone  (ROCEPHIN ) 1 g in sodium chloride  0.9 % 100 mL IVPB  Status:  Discontinued        1 g 200 mL/hr over 30 Minutes Intravenous Every 24 hours 09/01/23 1036 09/01/23 1830   09/02/23 0600  cefTRIAXone  (ROCEPHIN ) 2 g in sodium chloride  0.9 % 100 mL IVPB  Status:  Discontinued        2 g 200 mL/hr over 30 Minutes Intravenous Every 24 hours 09/01/23 2227 09/04/23 1330   09/01/23 2000  piperacillin -tazobactam (ZOSYN ) IVPB 3.375 g  Status:  Discontinued        3.375 g 12.5 mL/hr over 240 Minutes Intravenous Every 8 hours 09/01/23 1830 09/01/23 2227   09/01/23 1015  cefTRIAXone  (ROCEPHIN ) 1 g in sodium chloride  0.9 % 100 mL IVPB        1 g 200 mL/hr over 30 Minutes Intravenous STAT 09/01/23 1004 09/01/23 1123              Family Communication/Anticipated D/C date and plan/Code Status   DVT prophylaxis:      Code Status: Do not attempt resuscitation (DNR) -  Comfort care  Family Communication: Plan discussed with with Kevin, son, at the bedside Disposition Plan: Plan to discharge to hospice house   Status is: Inpatient Remains inpatient appropriate because: Comfort care       Subjective:   Interval events noted.  Patient has become more lethargic and minimally responsive.  [name], son, was at the bedside.  He said patient has been "like this" since he visited this morning.  Objective:    Vitals:   09/06/23 2111 09/07/23 0734 09/07/23 2156 09/08/23 0838  BP: 136/71 120/73 136/83 126/86  Pulse: 81 83 92 93  Resp: 18 16 18 18   Temp: 98.2 F (36.8 C) 98.3 F (36.8 C) 98.2 F (36.8 C) 97.9 F (36.6 C)  TempSrc:   Oral Oral  SpO2: 99% 99% 99% 98%  Weight:      Height:       No data  found.   Intake/Output Summary (Last 24 hours) at 09/08/2023 1415 Last data filed at 09/07/2023 2156 Gross per 24 hour  Intake 170 ml  Output 850 ml  Net -680 ml   Filed Weights   09/01/23 0641 09/01/23 1700  Weight: 54.3 kg 54.5 kg    Exam:  GEN: NAD SKIN: Warm and dry EYES: Anicteric ENT: MMM CV: RRR PULM: CTA B ABD: soft, ND, +BS CNS: Minimally responsive EXT: No edema or tenderness     Data Reviewed:   I have personally reviewed following labs and imaging studies:  Labs: Labs show the following:   Basic Metabolic Panel: Recent Labs  Lab 09/01/23 1750 09/01/23 1928 09/02/23 0436 09/03/23 0430  NA 138 138 139 136  K 2.7* 3.2* 3.2* 3.0*  CL 105 105 104 102  CO2 21* 20* 23 24  GLUCOSE 117* 173* 243* 234*  BUN 33* 35* 43* 40*  CREATININE 1.15* 1.11* 1.14* 0.80  CALCIUM 9.1 9.3 9.4 9.6  MG  --   --   --  1.7   GFR Estimated Creatinine Clearance: 43.7 mL/min (by C-G formula based on SCr of 0.8 mg/dL). Liver Function Tests: No results for input(s): "AST", "ALT", "ALKPHOS", "BILITOT", "PROT", "ALBUMIN" in the last 168 hours.  No results for input(s): "LIPASE", "AMYLASE" in the last 168 hours. No results for input(s): "AMMONIA" in the last 168 hours. Coagulation profile No results for input(s): "INR", "PROTIME" in the last 168 hours.  CBC: Recent Labs  Lab 09/02/23 0436 09/03/23 0430  WBC 14.6* 10.8*  HGB 10.1* 10.3*  HCT 31.0* 30.3*  MCV 92.5 90.7  PLT 133* 142*   Cardiac Enzymes: No results for input(s): "CKTOTAL", "CKMB", "CKMBINDEX", "TROPONINI" in the last 168 hours.  BNP (last 3 results) No results for input(s): "PROBNP" in the last 8760 hours. CBG: Recent Labs  Lab 09/01/23 1646 09/01/23 1722 09/01/23 1933 09/01/23 1959 09/02/23 0731  GLUCAP 113* 109* 184* 198* 191*   D-Dimer: No results for input(s): "DDIMER" in the last 72 hours. Hgb A1c: No results for input(s): "HGBA1C" in the last 72 hours. Lipid Profile: No results for  input(s): "CHOL", "HDL", "LDLCALC", "TRIG", "CHOLHDL", "LDLDIRECT" in the last 72 hours. Thyroid function studies: No results for input(s): "TSH", "T4TOTAL", "T3FREE", "THYROIDAB" in the last 72 hours.  Invalid input(s): "FREET3" Anemia work up: No results for input(s): "VITAMINB12", "FOLATE", "FERRITIN", "TIBC", "IRON", "RETICCTPCT" in the last 72 hours. Sepsis Labs: Recent Labs  Lab 09/01/23 1928 09/01/23 2130 09/01/23 2311 09/02/23 0436 09/03/23 0430  WBC  --   --   --  14.6* 10.8*  LATICACIDVEN 2.4* 1.6 1.2  --   --     Microbiology Recent Results (from the past 240 hours)  Culture, blood (routine x 2)     Status: Abnormal   Collection Time: 09/01/23  6:51 AM   Specimen: BLOOD  Result Value Ref Range Status   Specimen Description   Final    BLOOD BLOOD RIGHT ARM Performed at [facility], [address] [place], [place] [address]    Special Requests   Final    BOTTLES DRAWN AEROBIC AND ANAEROBIC Blood Culture adequate volume Performed at [facility], [address] [place], [place] [address]    Culture  Setup Time   Final    GRAM NEGATIVE RODS AEROBIC BOTTLE ONLY CRITICAL VALUE NOTED.  VALUE IS CONSISTENT WITH PREVIOUSLY REPORTED AND CALLED VALUE. Performed at [facility], [address] [place], [place] [address]    Culture (A)  Final    ESCHERICHIA COLI SUSCEPTIBILITIES PERFORMED ON PREVIOUS CULTURE WITHIN THE LAST 5 DAYS. Performed at [facility], [address], [place], [place] [address]    Report Status 09/04/2023 FINAL  Final  Culture, blood (routine x 2)     Status: Abnormal   Collection Time: 09/01/23  6:51 AM   Specimen: BLOOD  Result Value Ref Range Status   Specimen Description   Final    BLOOD BLOOD LEFT HAND Performed at [facility], [address], [place], [place] [address]    Special Requests   Final    BOTTLES DRAWN AEROBIC AND ANAEROBIC Blood Culture adequate volume Performed at [facility], [address] [place], [place] [address]    Culture  Setup Time   Final    GRAM NEGATIVE RODS IN BOTH AEROBIC AND ANAEROBIC BOTTLES CRITICAL RESULT CALLED TO, READ BACK BY AND VERIFIED WITH: WILL ANDERSON, PharMD @2210  09/01/2023 COP Performed at [facility], [address] [place], [place] [address]    Culture ESCHERICHIA COLI (A)  Final   Report Status 09/04/2023 FINAL  Final   Organism ID, Bacteria ESCHERICHIA COLI  Final   Organism ID, Bacteria ESCHERICHIA COLI  Final      Susceptibility   Escherichia coli - KIRBY BAUER*    CEFAZOLIN  INTERMEDIATE Intermediate    Escherichia coli - MIC*    AMPICILLIN >=32 RESISTANT Resistant     CEFEPIME <=0.12 SENSITIVE Sensitive     CEFTAZIDIME <=1 SENSITIVE Sensitive     CEFTRIAXONE  <=0.25 SENSITIVE Sensitive     CIPROFLOXACIN <=0.25 SENSITIVE Sensitive     GENTAMICIN  <=1 SENSITIVE Sensitive     IMIPENEM <=0.25 SENSITIVE Sensitive     TRIMETH/SULFA <=20 SENSITIVE Sensitive     AMPICILLIN/SULBACTAM 16 INTERMEDIATE Intermediate     PIP/TAZO <=4 SENSITIVE Sensitive ug/mL    * ESCHERICHIA COLI    ESCHERICHIA COLI  Resp panel by RT-PCR (RSV, Flu A&B, Covid) Anterior Nasal Swab     Status: None   Collection Time: 09/01/23  6:51 AM   Specimen: Anterior Nasal Swab  Result Value Ref Range Status   SARS Coronavirus 2 by RT PCR NEGATIVE NEGATIVE Final    Comment: (NOTE) SARS-CoV-2 target nucleic acids are NOT DETECTED.  The SARS-CoV-2 RNA is generally detectable in upper respiratory specimens during the acute phase of infection. The lowest concentration of SARS-CoV-2 viral copies this assay can detect is 138 copies/mL. A negative result does not preclude SARS-Cov-2 infection and should not be used as the sole basis for treatment  or other patient management decisions. A negative result may occur with  improper specimen collection/handling, submission of specimen other than nasopharyngeal swab, presence of viral  mutation(s) within the areas targeted by this assay, and inadequate number of viral copies(<138 copies/mL). A negative result must be combined with clinical observations, patient history, and epidemiological information. The expected result is Negative.  Fact Sheet for Patients:  BloggerCourse.com  Fact Sheet for Healthcare Providers:  SeriousBroker.it  This test is no t yet approved or cleared by the [place]  FDA and  has been authorized for detection and/or diagnosis of SARS-CoV-2 by FDA under an Emergency Use Authorization (EUA). This EUA will remain  in effect (meaning this test can be used) for the duration of the COVID-19 declaration under Section 564(b)(1) of the Act, 21 U.S.C.section 360bbb-3(b)(1), unless the authorization is terminated  or revoked sooner.       Influenza A by PCR NEGATIVE NEGATIVE Final   Influenza B by PCR NEGATIVE NEGATIVE Final    Comment: (NOTE) The Xpert Xpress SARS-CoV-2/FLU/RSV plus assay is intended as an aid in the diagnosis of influenza from Nasopharyngeal swab specimens and should not be used as a sole basis for treatment. Nasal washings and aspirates are unacceptable for Xpert Xpress SARS-CoV-2/FLU/RSV testing.  Fact Sheet for Patients: BloggerCourse.com  Fact Sheet for Healthcare Providers: SeriousBroker.it  This test is not yet approved or cleared by the [place]  FDA and has been authorized for detection and/or diagnosis of SARS-CoV-2 by FDA under an Emergency Use Authorization (EUA). This EUA will remain in effect (meaning this test can be used) for the duration of the COVID-19 declaration under Section 564(b)(1) of the Act, 21 U.S.C. section 360bbb-3(b)(1), unless the authorization is terminated or revoked.     Resp Syncytial Virus by PCR NEGATIVE NEGATIVE Final    Comment: (NOTE) Fact Sheet for  Patients: BloggerCourse.com  Fact Sheet for Healthcare Providers: SeriousBroker.it  This test is not yet approved or cleared by the [place]  FDA and has been authorized for detection and/or diagnosis of SARS-CoV-2 by FDA under an Emergency Use Authorization (EUA). This EUA will remain in effect (meaning this test can be used) for the duration of the COVID-19 declaration under Section 564(b)(1) of the Act, 21 U.S.C. section 360bbb-3(b)(1), unless the authorization is terminated or revoked.  Performed at [facility], [address] [place], [place] [address]   Blood Culture ID Panel (Reflexed)     Status: Abnormal   Collection Time: 09/01/23  6:51 AM  Result Value Ref Range Status   Enterococcus faecalis NOT DETECTED NOT DETECTED Final   Enterococcus Faecium NOT DETECTED NOT DETECTED Final   Listeria monocytogenes NOT DETECTED NOT DETECTED Final   Staphylococcus species NOT DETECTED NOT DETECTED Final   Staphylococcus aureus (BCID) NOT DETECTED NOT DETECTED Final   Staphylococcus epidermidis NOT DETECTED NOT DETECTED Final   Staphylococcus lugdunensis NOT DETECTED NOT DETECTED Final   Streptococcus species NOT DETECTED NOT DETECTED Final   Streptococcus agalactiae NOT DETECTED NOT DETECTED Final   Streptococcus pneumoniae NOT DETECTED NOT DETECTED Final   Streptococcus pyogenes NOT DETECTED NOT DETECTED Final   A.calcoaceticus-baumannii NOT DETECTED NOT DETECTED Final   Bacteroides fragilis NOT DETECTED NOT DETECTED Final   Enterobacterales DETECTED (A) NOT DETECTED Final    Comment: Enterobacterales represent a large order of gram negative bacteria, not a single organism. CRITICAL RESULT CALLED TO, READ BACK BY AND VERIFIED WITH: WILL ANDERSON, PharmMD @2210  09/01/2023 COP    Enterobacter cloacae complex NOT  DETECTED NOT DETECTED Final   Escherichia coli DETECTED (A) NOT DETECTED Final    Comment: CRITICAL RESULT  CALLED TO, READ BACK BY AND VERIFIED WITH: WILL ANDERSON, PharmMD @2210  09/01/2023 COP    Klebsiella aerogenes NOT DETECTED NOT DETECTED Final   Klebsiella oxytoca NOT DETECTED NOT DETECTED Final   Klebsiella pneumoniae NOT DETECTED NOT DETECTED Final   Proteus species NOT DETECTED NOT DETECTED Final   Salmonella species NOT DETECTED NOT DETECTED Final   Serratia marcescens NOT DETECTED NOT DETECTED Final   Haemophilus influenzae NOT DETECTED NOT DETECTED Final   Neisseria meningitidis NOT DETECTED NOT DETECTED Final   Pseudomonas aeruginosa NOT DETECTED NOT DETECTED Final   Stenotrophomonas maltophilia NOT DETECTED NOT DETECTED Final   Candida albicans NOT DETECTED NOT DETECTED Final   Candida auris NOT DETECTED NOT DETECTED Final   Candida glabrata NOT DETECTED NOT DETECTED Final   Candida krusei NOT DETECTED NOT DETECTED Final   Candida parapsilosis NOT DETECTED NOT DETECTED Final   Candida tropicalis NOT DETECTED NOT DETECTED Final   Cryptococcus neoformans/gattii NOT DETECTED NOT DETECTED Final   CTX-M ESBL NOT DETECTED NOT DETECTED Final   Carbapenem resistance IMP NOT DETECTED NOT DETECTED Final   Carbapenem resistance KPC NOT DETECTED NOT DETECTED Final   Carbapenem resistance NDM NOT DETECTED NOT DETECTED Final   Carbapenem resist OXA 48 LIKE NOT DETECTED NOT DETECTED Final   Carbapenem resistance VIM NOT DETECTED NOT DETECTED Final    Comment: Performed at [facility], [address], [place], [place] [address]  Urine Culture     Status: Abnormal   Collection Time: 09/01/23  9:07 AM   Specimen: Urine, Random  Result Value Ref Range Status   Specimen Description   Final    URINE, RANDOM Performed at [facility], [address] [place], [place] [address]    Special Requests   Final    NONE Reflexed from [id number] Performed at [facility], [address] [place], [place] [address]    Culture >=100,000 COLONIES/mL ESCHERICHIA COLI (A)   Final   Report Status 09/03/2023 FINAL  Final   Organism ID, Bacteria ESCHERICHIA COLI (A)  Final      Susceptibility   Escherichia coli - MIC*    AMPICILLIN >=32 RESISTANT Resistant     CEFAZOLIN  <=4 SENSITIVE Sensitive     CEFEPIME <=0.12 SENSITIVE Sensitive     CEFTRIAXONE  <=0.25 SENSITIVE Sensitive     CIPROFLOXACIN <=0.25 SENSITIVE Sensitive     GENTAMICIN  <=1 SENSITIVE Sensitive     IMIPENEM <=0.25 SENSITIVE Sensitive     NITROFURANTOIN <=16 SENSITIVE Sensitive     TRIMETH/SULFA <=20 SENSITIVE Sensitive     AMPICILLIN/SULBACTAM 16 INTERMEDIATE Intermediate     PIP/TAZO <=4 SENSITIVE Sensitive ug/mL    * >=100,000 COLONIES/mL ESCHERICHIA COLI  MRSA Next Gen by PCR, Nasal     Status: None   Collection Time: 09/01/23  5:13 PM   Specimen: Nasal Mucosa; Nasal Swab  Result Value Ref Range Status   MRSA by PCR Next Gen NOT DETECTED NOT DETECTED Final    Comment: (NOTE) The GeneXpert MRSA Assay (FDA approved for NASAL specimens only), is one component of a comprehensive MRSA colonization surveillance program. It is not intended to diagnose MRSA infection nor to guide or monitor treatment for MRSA infections. Test performance is not FDA approved in patients less than 71 years old. Performed at [facility], [address], [place], [place] [address]     Procedures  and diagnostic studies:  No results found.             LOS: 7 days   Arta Stump  [facility]   Pager on [email or web address]. If 7PM-7AM, please contact night-coverage at [email or web address]     09/08/2023, 2:15 PM

## 2023-09-08 NOTE — Plan of Care (Signed)
  Problem: Education: Goal: Ability to describe self-care measures that may prevent or decrease complications (Diabetes Survival Skills Education) will improve Outcome: Progressing Goal: Individualized Educational Video(s) Outcome: Progressing   Problem: Coping: Goal: Ability to adjust to condition or change in health will improve Outcome: Progressing   Problem: Fluid Volume: Goal: Ability to maintain a balanced intake and output will improve Outcome: Progressing   Problem: Health Behavior/Discharge Planning: Goal: Ability to identify and utilize available resources and services will improve Outcome: Progressing Goal: Ability to manage health-related needs will improve Outcome: Progressing   Problem: Metabolic: Goal: Ability to maintain appropriate glucose levels will improve Outcome: Progressing   Problem: Nutritional: Goal: Maintenance of adequate nutrition will improve Outcome: Progressing Goal: Progress toward achieving an optimal weight will improve Outcome: Progressing   Problem: Skin Integrity: Goal: Risk for impaired skin integrity will decrease Outcome: Progressing   Problem: Tissue Perfusion: Goal: Adequacy of tissue perfusion will improve Outcome: Progressing   Problem: Education: Goal: Knowledge of General Education information will improve Description: Including pain rating scale, medication(s)/side effects and non-pharmacologic comfort measures Outcome: Progressing   Problem: Clinical Measurements: Goal: Ability to maintain clinical measurements within normal limits will improve Outcome: Progressing Goal: Will remain free from infection Outcome: Progressing Goal: Diagnostic test results will improve Outcome: Progressing Goal: Respiratory complications will improve Outcome: Progressing Goal: Cardiovascular complication will be avoided Outcome: Progressing   Problem: Activity: Goal: Risk for activity intolerance will decrease Outcome: Progressing    Problem: Nutrition: Goal: Adequate nutrition will be maintained Outcome: Progressing   Problem: Elimination: Goal: Will not experience complications related to bowel motility Outcome: Progressing Goal: Will not experience complications related to urinary retention Outcome: Progressing

## 2023-09-08 NOTE — Progress Notes (Signed)
 Patient with son at bedside. Patient continues to have signs of air hunger, such as gasping. Medicated per MAR. Dying process reviewed with son. All questions answered. Will follow up

## 2023-09-08 NOTE — Progress Notes (Signed)
 [facility] Liaison Note  Follow up on new referral for hospice.  Patient should meet criteria for hospice IPU as she is requiring IV morphine  and ativan .  I met with the [name], son, at the bedside and found patient actively dying.  Worked with nursing team to get patient treated for acute respiratory distress and secretions.  I sat with the son for ~ an hour as patient with acute changes to respiratory rate with cheyne stokes breathing.  Support offered and sat in silence with [name].  Education provided on active dying phase and prepared him for things he may see.  Collaboration with nursing team to have close monitoring and offer medication if the son in agreement.  5:30p, 6:15p, 7:10pm- Multiple visits made to bedside to offer support and check on patient's status.  Ambrosio Junker, RN Nurse Liaison [phone]

## 2023-09-08 NOTE — Plan of Care (Signed)
  Problem: Skin Integrity: Goal: Risk for impaired skin integrity will decrease Outcome: Progressing   Problem: Tissue Perfusion: Goal: Adequacy of tissue perfusion will improve Outcome: Progressing   Problem: Clinical Measurements: Goal: Ability to maintain clinical measurements within normal limits will improve Outcome: Progressing Goal: Will remain free from infection Outcome: Progressing   Problem: Coping: Goal: Level of anxiety will decrease Outcome: Progressing   Problem: Pain Managment: Goal: General experience of comfort will improve and/or be controlled Outcome: Progressing   Problem: Safety: Goal: Ability to remain free from injury will improve Outcome: Progressing

## 2023-09-09 DIAGNOSIS — R6521 Severe sepsis with septic shock: Secondary | ICD-10-CM | POA: Diagnosis not present

## 2023-09-09 DIAGNOSIS — A419 Sepsis, unspecified organism: Secondary | ICD-10-CM | POA: Diagnosis not present

## 2023-09-09 MED ORDER — CHLORHEXIDINE GLUCONATE CLOTH 2 % EX PADS: 6.0000 | MEDICATED_PAD | Freq: Every day | CUTANEOUS | Status: DC

## 2023-09-09 NOTE — Progress Notes (Signed)
 Return call placed to patient's son [name]. Update on status given. [name] would like to continue to plan to transport to inpatient hospice. Care team made aware.

## 2023-09-09 NOTE — Progress Notes (Signed)
 [facility] Liaison Note  Patient approved for admission to the Hospice home today after 4pm.  Son to sign consents at 1pm at the [facility].  Transport with [facility] set for 4pm today.  Please call with any Hospice related questions or concerns.  [facility] Liaison [phone]

## 2023-09-09 NOTE — Progress Notes (Signed)
 Rounded on patient for comfort care rounds. No family at bedside. Patient alert, asking to go home. Appears restless. Bedside RN notified, who will medicate per MAR.  Will continue to follow.

## 2023-09-09 NOTE — TOC Transition Note (Signed)
 Transition of Care [facility]) - Discharge Note   Patient Details  Name: Emma Guzman MRN: [id number] Date of Birth: July 06, 1945  [facility] CM/SW Contact:  Crayton Docker, RN 09/09/2023, 1:36 PM   Clinical Narrative:     Discharge orders for [facility], [facility], [address] [place], [place] [address]. Alert received from Jackie C, [facility], patient will discharge via Geographical information systems officer at 1600 to [employer]. CM call to patient's son, [name] regarding address confirmation of discharge disposition. No answer, CM left message for return call.   Call received from patient's son, [name] regarding address of discharge disposition. Per patient's son, [name], patient will discharge to [facility], [facility].   Final next level of care: Hospice Medical Facility ([address] [place], [place] [address]) Barriers to Discharge: No Barriers Identified    Patient Goals and CMS Choice    Hospice House/[facility]   Discharge Placement     Hospice House           Discharge Plan and Services Additional resources added to the After Visit Summary for      Social Drivers of Health (SDOH) Interventions SDOH Screenings   Food Insecurity: Patient Unable To Answer (09/01/2023)  Housing: Patient Unable To Answer (09/01/2023)  Transportation Needs: Patient Unable To Answer (09/01/2023)  Utilities: Patient Unable To Answer (09/01/2023)  Financial Resource Strain: Low Risk  (09/15/2021)   Received from [facility]  Physical Activity: Inactive (09/15/2021)   [facility] from [facility]  Social Connections: Patient Unable To Answer (09/01/2023)  Stress: Stress Concern Present (09/15/2021)   Received from [facility]  Tobacco Use: Low Risk  (09/02/2023)     Readmission Risk Interventions     No data to display

## 2023-09-09 NOTE — Progress Notes (Signed)
 Patient experiencing further decline. Call placed to patient's son [name] to notify him of change and ask whether he would like to continue to pursue transfer to inpatient hospice or keep patient in hospital. [name] would like to receive another update in about 45 min to determine whether or not patient is experiencing further decline.   Will follow up with Kevin at 3:15pm

## 2023-09-09 NOTE — Progress Notes (Signed)
 Bedside RN notified of patient temp and appearance of pain. Will medicate per order.

## 2023-09-09 NOTE — Progress Notes (Signed)
 Magnet placed over device and taped in place.

## 2023-09-09 NOTE — Discharge Summary (Signed)
 Physician Discharge Summary   Patient: Emma Guzman MRN: [id number] DOB: 1945-06-10  Admit date:     09/01/2023  Discharge date: 09/09/23  Discharge Physician: Sheril Dines   PCP: Alexander Anes, MD   Recommendations at discharge:   Follow-up with hospice team at the hospice medical facility.  Discharge Diagnoses: Principal Problem:   Septic shock (HCC) Active Problems:   E. coli UTI   E coli bacteremia   AMS (altered mental status)  Resolved Problems:   * No resolved hospital problems. *  Hospital Course:  Emma Guzman is a 78 y.o. female with past medical history of HFrEF status post ICD, CKD, diabetes with previous DKA, and hypertension, peripheral neuropathy, recent hospitalization about a month prior to admission for fall and rhabdomyolysis, who presented to the hospital with general weakness, word finding difficulty and altered mental status.   She was hypotensive in the ED.  She was started on IV fluids and antibiotics for sepsis and UTI and was admitted to the medicine service.  However, despite IV fluids, patient became more hypotensive.  Intensivist was consulted and patient was transferred to the ICU for further management.  She was treated with IV vasopressors.   Assessment and Plan:   Septic shock secondary to acute E. coli UTI and bacteremia Acute toxic metabolic encephalopathy History of HFrEF s/p ICD but now with recovered EF (2D echo on 09/02/2023 showed EF estimated at 50 to 55%).   Hypokalemia   Insulin -dependent diabetes mellitus   Nausea and vomiting    PLAN  S/p treatment with 4 days of IV ceftriaxone  (completed on 09/04/2023). She was also treated with IV fluids and vasopressors. She was transitioned to comfort care in the hospital.  Her condition slowly declined and she is deemed eligible for discharge to hospice facility for comfort care. Plan discussed with [name], son, over the phone       Consultants: Intensivist,  cardiologist Procedures performed: None Disposition: Hospice care Diet recommendation:  Discharge Diet Orders (From admission, onward)     Start     Ordered   09/09/23 0000  Diet - low sodium heart healthy        09/09/23 1227           Regular diet DISCHARGE MEDICATION: Allergies as of 09/09/2023       Reactions   Metformin Diarrhea   Pravastatin Other (See Comments)        Medication List     STOP taking these medications    acetaminophen  500 MG tablet Commonly known as: TYLENOL    carvedilol  25 MG tablet Commonly known as: COREG    docusate sodium  100 MG capsule Commonly known as: COLACE   glipiZIDE 10 MG 24 hr tablet Commonly known as: GLUCOTROL XL   insulin  glargine-yfgn 100 UNIT/ML injection Commonly known as: SEMGLEE    lisinopril  10 MG tablet Commonly known as: ZESTRIL    mirtazapine 7.5 MG tablet Commonly known as: REMERON   omeprazole 20 MG capsule Commonly known as: PRILOSEC   polyethylene glycol 17 g packet Commonly known as: MIRALAX  / GLYCOLAX    senna 8.6 MG Tabs tablet Commonly known as: SENOKOT   sertraline 25 MG tablet Commonly known as: ZOLOFT   traZODone  50 MG tablet Commonly known as: DESYREL         Follow-up Information     Paraschos, Alexander, MD. Go in 1 week(s).   Specialty: Cardiology Contact information: [address] [facility] [place] [address] [phone]  Alexander Anes, MD Follow up.   Specialty: Family Medicine Why: Hospital follow up Contact information: [address] [place] [address] [phone]                Discharge Exam: [name] Weights   09/01/23 0641 09/01/23 1700  Weight: 54.3 kg 54.5 kg   GEN: NAD SKIN: Warm and dry EYES: No pallor or icterus ENT: MMM CV: RRR PULM: CTA B ABD: soft, ND, NT, +BS CNS: Drowsy but arousable, mouthing some words but speech is barely audible EXT: No edema or tenderness   Condition at  discharge: stable  The results of significant diagnostics from this hospitalization (including imaging, microbiology, ancillary and laboratory) are listed below for reference.   Imaging Studies: ECHOCARDIOGRAM COMPLETE Result Date: 09/02/2023    ECHOCARDIOGRAM REPORT   Patient Name:   Emma Guzman Date of Exam: 09/02/2023 Medical Rec #:  [id number]         Height:       61.0 in Accession #:    [id number]        Weight:       120.1 lb Date of Birth:  [birth year]         BSA:          1.521 m Patient Age:    9 years          BP:           96/55 mmHg Patient Gender: F                 HR:           74 bpm. Exam Location:  [facility] Procedure: 2D Echo, Cardiac Doppler and Color Doppler (Both Spectral and Color            Flow Doppler were utilized during procedure). Indications:     Shock R57.9  History:         Patient has no prior history of Echocardiogram examinations.                  CHF, Pacemaker; Risk Factors:Hypertension.  Sonographer:     Broadus Canes Referring Phys:  [id number] KHABIB DGAYLI Diagnosing Phys: Joetta Mustache  Sonographer Comments: No parasternal window. Image acquisition challenging due to patient body habitus. IMPRESSIONS  1. Technically difficult study. Definity not used.  2. Left ventricular ejection fraction, by estimation, is 50 to 55%. The left ventricle has low normal function. Left ventricular endocardial border not optimally defined to evaluate regional wall motion. Left ventricular diastolic parameters are indeterminate.  3. Right ventricular systolic function was not well visualized. The right ventricular size is not well visualized.  4. The mitral valve is normal in structure. Trivial mitral valve regurgitation.  5. The aortic valve was not well visualized. Aortic valve regurgitation is trivial. Aortic valve sclerosis/calcification is present, without any evidence of aortic stenosis. FINDINGS  Left Ventricle: Left ventricular ejection fraction, by estimation, is 50 to 55%. The left  ventricle has low normal function. Left ventricular endocardial border not optimally defined to evaluate regional wall motion. The left ventricular internal cavity  size was normal in size. There is no left ventricular hypertrophy. Left ventricular diastolic parameters are indeterminate.  LV Wall Scoring: The entire anterior wall, entire lateral wall, entire inferior wall, basal inferoseptal segment, and apex are normal. Right Ventricle: The right ventricular size is not well visualized. Right vetricular wall thickness was not well visualized. Right ventricular systolic function was not well visualized. Left  Atrium: Left atrial size was normal in size. Right Atrium: Right atrial size was normal in size. Pericardium: There is no evidence of pericardial effusion. Mitral Valve: The mitral valve is normal in structure. Trivial mitral valve regurgitation. MV peak gradient, 2.4 mmHg. The mean mitral valve gradient is 1.0 mmHg. Tricuspid Valve: The tricuspid valve is not well visualized. Tricuspid valve regurgitation is trivial. Aortic Valve: The aortic valve was not well visualized. Aortic valve regurgitation is trivial. Aortic valve sclerosis/calcification is present, without any evidence of aortic stenosis. Aortic valve mean gradient measures 3.0 mmHg. Aortic valve peak gradient measures 6.2 mmHg. Aortic valve area, by VTI measures 2.64 cm. Pulmonic Valve: The pulmonic valve was not assessed. Pulmonic valve regurgitation is not visualized. Aorta: The aortic root is normal in size and structure. IAS/Shunts: The interatrial septum was not well visualized. Additional Comments: A device lead is visualized.  LEFT VENTRICLE PLAX 2D LVIDd:         3.50 cm   Diastology LVIDs:         2.60 cm   LV e' medial:    3.81 cm/s LV PW:         0.80 cm   LV E/e' medial:  17.5 LV IVS:        1.00 cm   LV e' lateral:   4.35 cm/s LVOT diam:     1.90 cm   LV E/e' lateral: 15.3 LV SV:         54 LV SV Index:   35 LVOT Area:     2.84 cm   RIGHT VENTRICLE RV Basal diam:  4.00 cm RV Mid diam:    3.10 cm LEFT ATRIUM             Index        RIGHT ATRIUM           Index LA diam:        3.10 cm 2.04 cm/m   RA Area:     15.40 cm LA Vol (A2C):   34.6 ml 22.75 ml/m  RA Volume:   41.80 ml  27.48 ml/m LA Vol (A4C):   28.0 ml 18.41 ml/m LA Biplane Vol: 31.2 ml 20.51 ml/m  AORTIC VALVE AV Area (Vmax):    2.08 cm AV Area (Vmean):   2.24 cm AV Area (VTI):     2.64 cm AV Vmax:           124.00 cm/s AV Vmean:          82.800 cm/s AV VTI:            0.203 m AV Peak Grad:      6.2 mmHg AV Mean Grad:      3.0 mmHg LVOT Vmax:         91.00 cm/s LVOT Vmean:        65.300 cm/s LVOT VTI:          0.189 m LVOT/AV VTI ratio: 0.93  AORTA Ao Root diam: 2.10 cm MITRAL VALVE               TRICUSPID VALVE MV Area (PHT): 3.19 cm    TR Peak grad:   19.5 mmHg MV Area VTI:   2.49 cm    TR Vmax:        221.00 cm/s MV Peak grad:  2.4 mmHg MV Mean grad:  1.0 mmHg    SHUNTS MV Vmax:       0.78 m/s    Systemic  VTI:  0.19 m MV Vmean:      50.0 cm/s   Systemic Diam: 1.90 cm MV Decel Time: 238 msec MV E velocity: 66.60 cm/s MV A velocity: 83.30 cm/s MV E/A ratio:  0.80 Joetta Mustache Electronically signed by Joetta Mustache Signature Date/Time: 09/02/2023/12:21:48 PM    Final    US  RENAL Result Date: 09/01/2023 CLINICAL DATA:  Acute kidney injury. EXAM: RENAL / URINARY TRACT ULTRASOUND COMPLETE COMPARISON:  Abdomen CT 08/18/2021 FINDINGS: Right Kidney: Renal measurements: 12.3 x 4.9 x 5.8 cm = volume: 182 mL. Simple renal cysts evident in the interpolar region, similar to prior CT with 1 measuring up to 2.2 cm in the other measuring 3.3 cm. No hydronephrosis. Parenchymal echogenicity is increased. Left Kidney: Renal measurements: 10.7 x 5.4 x 4.7 cm = volume: 142 mL. Lower pole cyst measures 2.0 cm similar to prior CT. No hydronephrosis. Increased echogenicity. Bladder: Bladder wall appears thickened and irregular with layering debris. Other: None. IMPRESSION: 1. Increased  echogenicity of the renal parenchyma bilaterally compatible with medical renal disease. No hydronephrosis. 2. Bilateral renal cysts. 3. Bladder wall appears thickened and irregular with layering debris. Correlation to urinalysis recommended to evaluate for urinary tract infection. Electronically Signed   By: Donnal Fusi M.D.   On: 09/01/2023 12:19   CT ANGIO HEAD NECK W WO CM Result Date: 09/01/2023 EXAM: CTA Head and Neck with Intravenous Contrast. CLINICAL HISTORY: 78 year old female with declining mental status the last several days. Hypotension. Stroke, follow up. TECHNIQUE: Axial CTA images of the head and neck performed with intravenous contrast. Two-dimensional MIP and/or three-dimensional MIP and volume rendered reformations were performed. Note: Per PQRS, the description of internal carotid artery percent stenosis, including 0 percent or normal exam, is based on [place] American Symptomatic Carotid Endarterectomy Trial (NASCET) criteria. Dose reduction technique was used including one or more of the following: automated exposure control, adjustment of mA and kV according to patient size, and/or iterative reconstruction. CONTRAST: 60mL iohexol  (OMNIPAQUE ) 350 MG/ML injection 75 mL IOHEXOL  350 MG/ML SOLN. COMPARISON: Head CT dated 09/12/2023. FINDINGS: CTA NECK: COMMON CAROTID ARTERIES: No significant stenosis. No dissection or occlusion. INTERNAL CAROTID ARTERIES: No stenosis by NASCET criteria. No dissection or occlusion. Atherosclerotic calcifications of the carotid siphons without significant stenosis. VERTEBRAL ARTERIES: Left dominant vertebral artery. No significant stenosis. No dissection or occlusion. CTA HEAD: ANTERIOR CEREBRAL ARTERIES: No significant stenosis. No occlusion. No aneurysm. MIDDLE CEREBRAL ARTERIES: Moderate stenosis of the anterior temporal branch of the right MCA. No occlusion. No aneurysm. POSTERIOR CEREBRAL ARTERIES: Persistent fetal origin of the bilateral PCAs with hypoplastic  bilateral P1 segments. Multifocal stenosis of the bilateral PCAs with moderate stenosis of the distal left P2 segment of the PCA. Moderate stenosis of the right posterior communicating artery continuing into the PCA where there is moderate to severe stenosis of the distal P2 segments. No aneurysm. BASILAR ARTERY: No significant stenosis. No occlusion. No aneurysm. SOFT TISSUES: No acute abnormality. No masses or lymphadenopathy. BONES: No acute abnormality. IMPRESSION: 1. No large vessel occlusion. 2. Multifocal stenosis of the bilateral PCAs, including moderate stenosis of the distal left P2 segment and moderate to severe stenosis of the distal right P2 segment. 3. Moderate stenosis of the right posterior communicating artery continuing into the PCA. 4. Moderate stenosis of the anterior temporal branch of the right MCA. 5. Atherosclerotic calcifications of the carotid siphons without significant stenosis or aneurysm. Electronically signed by: Audra Blend MD 09/01/2023 11:36 AM EDT RP Workstation: [id number]   CT Head  Wo Contrast Result Date: 09/01/2023 CLINICAL DATA:  78 year old female with declining mental status the last several days. Hypotension. EXAM: CT HEAD WITHOUT CONTRAST TECHNIQUE: Contiguous axial images were obtained from the base of the skull through the vertex without intravenous contrast. RADIATION DOSE REDUCTION: This exam was performed according to the departmental dose-optimization program which includes automated exposure control, adjustment of the mA and/or kV according to patient size and/or use of iterative reconstruction technique. COMPARISON:  Head CT 08/02/2023. FINDINGS: Brain: Stable cerebral volume, within normal limits for age. No midline shift, ventriculomegaly, mass effect, evidence of mass lesion, intracranial hemorrhage or evidence of cortically based acute infarction. New since 08/02/2023 but circumscribed curvilinear 12 mm lacunar type infarct of the left basal ganglia  (series 100, image 32). Superimposed patchy and confluent bilateral cerebral white matter hypodensity elsewhere, gray-white differentiation elsewhere appears stable. Vascular: Calcified atherosclerosis at the skull base. No suspicious intracranial vascular hyperdensity. Skull: Hyperostosis.  No acute osseous abnormality identified. Sinuses/Orbits: Visualized paranasal sinuses and mastoids are clear. Other: No acute orbit or scalp soft tissue finding. IMPRESSION: 1. 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 2. No superimposed acute intracranial abnormality. Underlying chronic white matter disease. Electronically Signed   By: Marlise Simpers M.D.   On: 09/01/2023 07:36   DG Chest Port 1 View Result Date: 09/01/2023 CLINICAL DATA:  Weakness.  Hypotension. EXAM: PORTABLE CHEST 1 VIEW COMPARISON:  08/02/2023 FINDINGS: Cardiopericardial silhouette is at upper limits of normal for size. The lungs are clear without focal pneumonia, edema, pneumothorax or pleural effusion. Interstitial markings are diffusely coarsened with chronic features. Left-sided permanent pacemaker/AICD again noted. Bones are diffusely demineralized. IMPRESSION: Chronic interstitial coarsening without acute cardiopulmonary findings. Electronically Signed   By: Donnal Fusi M.D.   On: 09/01/2023 07:15    Microbiology: Results for orders placed or performed during the hospital encounter of 09/01/23  Culture, blood (routine x 2)     Status: Abnormal   Collection Time: 09/01/23  6:51 AM   Specimen: BLOOD  Result Value Ref Range Status   Specimen Description   Final    BLOOD BLOOD RIGHT ARM Performed at [facility], [address], [place], [place] [address]    Special Requests   Final    BOTTLES DRAWN AEROBIC AND ANAEROBIC Blood Culture adequate volume Performed at [facility], [address], [place], [place] [address]    Culture  Setup Time   Final    GRAM NEGATIVE  RODS AEROBIC BOTTLE ONLY CRITICAL VALUE NOTED.  VALUE IS CONSISTENT WITH PREVIOUSLY REPORTED AND CALLED VALUE. Performed at [facility], [address] [place], [place] [address]    Culture (A)  Final    ESCHERICHIA COLI SUSCEPTIBILITIES PERFORMED ON PREVIOUS CULTURE WITHIN THE LAST 5 DAYS. Performed at [facility], [address], [place], [place] [address]    Report Status 09/04/2023 FINAL  Final  Culture, blood (routine x 2)     Status: Abnormal   Collection Time: 09/01/23  6:51 AM   Specimen: BLOOD  Result Value Ref Range Status   Specimen Description   Final    BLOOD BLOOD LEFT HAND Performed at [facility], [address], [place], [place] [address]    Special Requests   Final    BOTTLES DRAWN AEROBIC AND ANAEROBIC Blood Culture adequate volume Performed at [facility], [address], [place], [place] [address]    Culture  Setup Time  Final    GRAM NEGATIVE RODS IN BOTH AEROBIC AND ANAEROBIC BOTTLES CRITICAL RESULT CALLED TO, READ BACK BY AND VERIFIED WITH: WILL ANDERSON, PharMD @2210  09/01/2023 COP Performed at [facility], [address], [place], [place] [address]    Culture ESCHERICHIA COLI (A)  Final   Report Status 09/04/2023 FINAL  Final   Organism ID, Bacteria ESCHERICHIA COLI  Final   Organism ID, Bacteria ESCHERICHIA COLI  Final      Susceptibility   Escherichia coli - KIRBY BAUER*    CEFAZOLIN  INTERMEDIATE Intermediate    Escherichia coli - MIC*    AMPICILLIN >=32 RESISTANT Resistant     CEFEPIME <=0.12 SENSITIVE Sensitive     CEFTAZIDIME <=1 SENSITIVE Sensitive     CEFTRIAXONE  <=0.25 SENSITIVE Sensitive     CIPROFLOXACIN <=0.25 SENSITIVE Sensitive     GENTAMICIN  <=1 SENSITIVE Sensitive     IMIPENEM <=0.25 SENSITIVE Sensitive     TRIMETH/SULFA <=20 SENSITIVE Sensitive     AMPICILLIN/SULBACTAM 16 INTERMEDIATE Intermediate     PIP/TAZO <=4 SENSITIVE Sensitive ug/mL    * ESCHERICHIA COLI    ESCHERICHIA  COLI  Resp panel by RT-PCR (RSV, Flu A&B, Covid) Anterior Nasal Swab     Status: None   Collection Time: 09/01/23  6:51 AM   Specimen: Anterior Nasal Swab  Result Value Ref Range Status   SARS Coronavirus 2 by RT PCR NEGATIVE NEGATIVE Final    Comment: (NOTE) SARS-CoV-2 target nucleic acids are NOT DETECTED.  The SARS-CoV-2 RNA is generally detectable in upper respiratory specimens during the acute phase of infection. The lowest concentration of SARS-CoV-2 viral copies this assay can detect is 138 copies/mL. A negative result does not preclude SARS-Cov-2 infection and should not be used as the sole basis for treatment or other patient management decisions. A negative result may occur with  improper specimen collection/handling, submission of specimen other than nasopharyngeal swab, presence of viral mutation(s) within the areas targeted by this assay, and inadequate number of viral copies(<138 copies/mL). A negative result must be combined with clinical observations, patient history, and epidemiological information. The expected result is Negative.  Fact Sheet for Patients:  BloggerCourse.com  Fact Sheet for Healthcare Providers:  SeriousBroker.it  This test is no t yet approved or cleared by the [place]  FDA and  has been authorized for detection and/or diagnosis of SARS-CoV-2 by FDA under an Emergency Use Authorization (EUA). This EUA will remain  in effect (meaning this test can be used) for the duration of the COVID-19 declaration under Section 564(b)(1) of the Act, 21 U.S.C.section 360bbb-3(b)(1), unless the authorization is terminated  or revoked sooner.       Influenza A by PCR NEGATIVE NEGATIVE Final   Influenza B by PCR NEGATIVE NEGATIVE Final    Comment: (NOTE) The Xpert Xpress SARS-CoV-2/FLU/RSV plus assay is intended as an aid in the diagnosis of influenza from Nasopharyngeal swab specimens and should not be  used as a sole basis for treatment. Nasal washings and aspirates are unacceptable for Xpert Xpress SARS-CoV-2/FLU/RSV testing.  Fact Sheet for Patients: BloggerCourse.com  Fact Sheet for Healthcare Providers: SeriousBroker.it  This test is not yet approved or cleared by the [place]  FDA and has been authorized for detection and/or diagnosis of SARS-CoV-2 by FDA under an Emergency Use Authorization (EUA). This EUA will remain in effect (meaning this test can be used) for the duration of the COVID-19 declaration under Section 564(b)(1) of the Act, 21 U.S.C. section 360bbb-3(b)(1), unless the authorization is  terminated or revoked.     Resp Syncytial Virus by PCR NEGATIVE NEGATIVE Final    Comment: (NOTE) Fact Sheet for Patients: BloggerCourse.com  Fact Sheet for Healthcare Providers: SeriousBroker.it  This test is not yet approved or cleared by the [place]  FDA and has been authorized for detection and/or diagnosis of SARS-CoV-2 by FDA under an Emergency Use Authorization (EUA). This EUA will remain in effect (meaning this test can be used) for the duration of the COVID-19 declaration under Section 564(b)(1) of the Act, 21 U.S.C. section 360bbb-3(b)(1), unless the authorization is terminated or revoked.  Performed at [facility], [address] [place], [place] [address]   Blood Culture ID Panel (Reflexed)     Status: Abnormal   Collection Time: 09/01/23  6:51 AM  Result Value Ref Range Status   Enterococcus faecalis NOT DETECTED NOT DETECTED Final   Enterococcus Faecium NOT DETECTED NOT DETECTED Final   Listeria monocytogenes NOT DETECTED NOT DETECTED Final   Staphylococcus species NOT DETECTED NOT DETECTED Final   Staphylococcus aureus (BCID) NOT DETECTED NOT DETECTED Final   Staphylococcus epidermidis NOT DETECTED NOT DETECTED Final   Staphylococcus  lugdunensis NOT DETECTED NOT DETECTED Final   Streptococcus species NOT DETECTED NOT DETECTED Final   Streptococcus agalactiae NOT DETECTED NOT DETECTED Final   Streptococcus pneumoniae NOT DETECTED NOT DETECTED Final   Streptococcus pyogenes NOT DETECTED NOT DETECTED Final   A.calcoaceticus-baumannii NOT DETECTED NOT DETECTED Final   Bacteroides fragilis NOT DETECTED NOT DETECTED Final   Enterobacterales DETECTED (A) NOT DETECTED Final    Comment: Enterobacterales represent a large order of gram negative bacteria, not a single organism. CRITICAL RESULT CALLED TO, READ BACK BY AND VERIFIED WITH: WILL ANDERSON, PharmMD @2210  09/01/2023 COP    Enterobacter cloacae complex NOT DETECTED NOT DETECTED Final   Escherichia coli DETECTED (A) NOT DETECTED Final    Comment: CRITICAL RESULT CALLED TO, READ BACK BY AND VERIFIED WITH: WILL ANDERSON, PharmMD @2210  09/01/2023 COP    Klebsiella aerogenes NOT DETECTED NOT DETECTED Final   Klebsiella oxytoca NOT DETECTED NOT DETECTED Final   Klebsiella pneumoniae NOT DETECTED NOT DETECTED Final   Proteus species NOT DETECTED NOT DETECTED Final   Salmonella species NOT DETECTED NOT DETECTED Final   Serratia marcescens NOT DETECTED NOT DETECTED Final   Haemophilus influenzae NOT DETECTED NOT DETECTED Final   Neisseria meningitidis NOT DETECTED NOT DETECTED Final   Pseudomonas aeruginosa NOT DETECTED NOT DETECTED Final   Stenotrophomonas maltophilia NOT DETECTED NOT DETECTED Final   Candida albicans NOT DETECTED NOT DETECTED Final   Candida auris NOT DETECTED NOT DETECTED Final   Candida glabrata NOT DETECTED NOT DETECTED Final   Candida krusei NOT DETECTED NOT DETECTED Final   Candida parapsilosis NOT DETECTED NOT DETECTED Final   Candida tropicalis NOT DETECTED NOT DETECTED Final   Cryptococcus neoformans/gattii NOT DETECTED NOT DETECTED Final   CTX-M ESBL NOT DETECTED NOT DETECTED Final   Carbapenem resistance IMP NOT DETECTED NOT DETECTED Final    Carbapenem resistance KPC NOT DETECTED NOT DETECTED Final   Carbapenem resistance NDM NOT DETECTED NOT DETECTED Final   Carbapenem resist OXA 48 LIKE NOT DETECTED NOT DETECTED Final   Carbapenem resistance VIM NOT DETECTED NOT DETECTED Final    Comment: Performed at [facility], [address] [place], [place] [address]  Urine Culture     Status: Abnormal   Collection Time: 09/01/23  9:07 AM   Specimen: Urine, Random  Result Value Ref Range Status  Specimen Description   Final    URINE, RANDOM Performed at [facility], [address] [place], [place] [address]    Special Requests   Final    NONE Reflexed from [id number] Performed at [facility], [address] [place], [place] [address]    Culture >=100,000 COLONIES/mL ESCHERICHIA COLI (A)  Final   Report Status 09/03/2023 FINAL  Final   Organism ID, Bacteria ESCHERICHIA COLI (A)  Final      Susceptibility   Escherichia coli - MIC*    AMPICILLIN >=32 RESISTANT Resistant     CEFAZOLIN  <=4 SENSITIVE Sensitive     CEFEPIME <=0.12 SENSITIVE Sensitive     CEFTRIAXONE  <=0.25 SENSITIVE Sensitive     CIPROFLOXACIN <=0.25 SENSITIVE Sensitive     GENTAMICIN  <=1 SENSITIVE Sensitive     IMIPENEM <=0.25 SENSITIVE Sensitive     NITROFURANTOIN <=16 SENSITIVE Sensitive     TRIMETH/SULFA <=20 SENSITIVE Sensitive     AMPICILLIN/SULBACTAM 16 INTERMEDIATE Intermediate     PIP/TAZO <=4 SENSITIVE Sensitive ug/mL    * >=100,000 COLONIES/mL ESCHERICHIA COLI  MRSA Next Gen by PCR, Nasal     Status: None   Collection Time: 09/01/23  5:13 PM   Specimen: Nasal Mucosa; Nasal Swab  Result Value Ref Range Status   MRSA by PCR Next Gen NOT DETECTED NOT DETECTED Final    Comment: (NOTE) The GeneXpert MRSA Assay (FDA approved for NASAL specimens only), is one component of a comprehensive MRSA colonization surveillance program. It is not intended to diagnose MRSA infection nor to guide or monitor treatment for MRSA  infections. Test performance is not FDA approved in patients less than 59 years old. Performed at [facility], [address] [place], [place] [address]     Labs: CBC: Recent Labs  Lab 09/03/23 0430  WBC 10.8*  HGB 10.3*  HCT 30.3*  MCV 90.7  PLT 142*   Basic Metabolic Panel: Recent Labs  Lab 09/03/23 0430  NA 136  K 3.0*  CL 102  CO2 24  GLUCOSE 234*  BUN 40*  CREATININE 0.80  CALCIUM 9.6  MG 1.7   Liver Function Tests: No results for input(s): "AST", "ALT", "ALKPHOS", "BILITOT", "PROT", "ALBUMIN" in the last 168 hours. CBG: No results for input(s): "GLUCAP" in the last 168 hours.  Discharge time spent: greater than 30 minutes.  Signed: Sheril Dines, MD [facility] 09/09/2023

## 2023-09-09 NOTE — Progress Notes (Signed)
 [employer] representative notified of need for device to be turned off.   Representative will not be available for "hours" Requesting nursing staff place magnet on device to turn off the defib.

## 2023-10-01 DEATH — deceased
